# Patient Record
Sex: Male | Born: 1960 | Race: Black or African American | Hispanic: No | Marital: Married | Smoking: Never smoker
Health system: Southern US, Community
[De-identification: ages and names within clinical notes are randomized; demographics above are authoritative.]

## PROBLEM LIST (undated history)

## (undated) DIAGNOSIS — I10 Essential (primary) hypertension: Secondary | ICD-10-CM

## (undated) DIAGNOSIS — E119 Type 2 diabetes mellitus without complications: Secondary | ICD-10-CM

## (undated) DIAGNOSIS — E785 Hyperlipidemia, unspecified: Secondary | ICD-10-CM

## (undated) DIAGNOSIS — K4090 Unilateral inguinal hernia, without obstruction or gangrene, not specified as recurrent: Secondary | ICD-10-CM

## (undated) HISTORY — DX: Hyperlipidemia, unspecified: E78.5

## (undated) HISTORY — DX: Essential (primary) hypertension: I10

## (undated) HISTORY — PX: HERNIA REPAIR: SHX51

## (undated) HISTORY — DX: Unilateral inguinal hernia, without obstruction or gangrene, not specified as recurrent: K40.90

---

## 2006-08-25 ENCOUNTER — Emergency Department (HOSPITAL_COMMUNITY): Admission: EM | Admit: 2006-08-25 | Discharge: 2006-08-25 | Payer: Self-pay | Admitting: Emergency Medicine

## 2008-01-26 ENCOUNTER — Emergency Department (HOSPITAL_COMMUNITY): Admission: EM | Admit: 2008-01-26 | Discharge: 2008-01-27 | Payer: Self-pay | Admitting: Emergency Medicine

## 2011-01-10 LAB — GC/CHLAMYDIA PROBE AMP, GENITAL: GC Probe Amp, Genital: NEGATIVE

## 2011-01-10 LAB — URINALYSIS, ROUTINE W REFLEX MICROSCOPIC
Glucose, UA: NEGATIVE
Protein, ur: 30 — AB
Specific Gravity, Urine: 1.028
pH: 6

## 2011-01-10 LAB — URINE MICROSCOPIC-ADD ON

## 2011-01-10 LAB — URINE CULTURE

## 2011-01-10 LAB — RPR: RPR Ser Ql: NONREACTIVE

## 2013-10-10 ENCOUNTER — Encounter (HOSPITAL_COMMUNITY): Payer: Self-pay | Admitting: Emergency Medicine

## 2013-10-10 ENCOUNTER — Emergency Department (HOSPITAL_COMMUNITY)
Admission: EM | Admit: 2013-10-10 | Discharge: 2013-10-10 | Disposition: A | Discharge status: Home / Self Care | Payer: Self-pay | Attending: Emergency Medicine | Admitting: Emergency Medicine

## 2013-10-10 DIAGNOSIS — I10 Essential (primary) hypertension: Secondary | ICD-10-CM | POA: Insufficient documentation

## 2013-10-10 HISTORY — DX: Essential (primary) hypertension: I10

## 2013-10-10 LAB — I-STAT CHEM 8, ED
BUN: 17 mg/dL (ref 6–23)
CHLORIDE: 104 meq/L (ref 96–112)
CREATININE: 1.1 mg/dL (ref 0.50–1.35)
Calcium, Ion: 1.16 mmol/L (ref 1.12–1.23)
GLUCOSE: 111 mg/dL — AB (ref 70–99)
HEMATOCRIT: 46 % (ref 39.0–52.0)
Hemoglobin: 15.6 g/dL (ref 13.0–17.0)
POTASSIUM: 4.4 meq/L (ref 3.7–5.3)
Sodium: 139 mEq/L (ref 137–147)
TCO2: 26 mmol/L (ref 0–100)

## 2013-10-10 MED ORDER — HYDRALAZINE HCL 25 MG PO TABS
25.0000 mg | ORAL_TABLET | Freq: Once | ORAL | Status: DC
  Filled 2013-10-10 (×2): qty 1

## 2013-10-10 MED ORDER — AMLODIPINE BESYLATE 5 MG PO TABS: 5.0000 mg | ORAL_TABLET | Freq: Every day | ORAL | Status: DC

## 2013-10-10 NOTE — Discharge Instructions (Signed)
Your lab work and EKG revealed no abnormalities. Your blood pressure was not severely elevated and came down by itself after you got here, and you weren't having symptoms, therefore you have been sent home with medications and you will need to see a primary care doctor for further care. If you develop any chest pain, shortness of breath, vision changes or numbness/tingling in your extremities, return to the emergency department immediately. You will need to see an eye doctor for a check of your eyes to make sure your blood pressure hasn't affected them.   Hypertension Hypertension is another name for high blood pressure. High blood pressure forces your heart to work harder to pump blood. A blood pressure reading has two numbers, which includes a higher number over a lower number (example: 110/72). HOME CARE   Have your blood pressure rechecked by your doctor.  Only take medicine as told by your doctor. Follow the directions carefully. The medicine does not work as well if you skip doses. Skipping doses also puts you at risk for problems.  Do not smoke.  Monitor your blood pressure at home as told by your doctor. GET HELP IF:  You think you are having a reaction to the medicine you are taking.  You have repeat headaches or feel dizzy.  You have puffiness (swelling) in your ankles.  You have trouble with your vision. GET HELP RIGHT AWAY IF:   You get a very bad headache and are confused.  You feel weak, numb, or faint.  You get chest or belly (abdominal) pain.  You throw up (vomit).  You cannot breathe very well. MAKE SURE YOU:   Understand these instructions.  Will watch your condition.  Will get help right away if you are not doing well or get worse. Document Released: 08/28/2007 Document Revised: 03/16/2013 Document Reviewed: 01/01/2013 [facility] Patient Information 2015 [place], [place]. This information is not intended to replace advice given to you by your health care  provider. Make sure you discuss any questions you have with your health care provider.  Managing Your High Blood Pressure Blood pressure is a measurement of how forceful your blood is pressing against the walls of the arteries. Arteries are muscular tubes within the circulatory system. Blood pressure does not stay the same. Blood pressure rises when you are active, excited, or nervous; and it lowers during sleep and relaxation. If the numbers measuring your blood pressure stay above normal most of the time, you are at risk for health problems. High blood pressure (hypertension) is a long-term (chronic) condition in which blood pressure is elevated. A blood pressure reading is recorded as two numbers, such as 120 over 80 (or 120/80). The first, higher number is called the systolic pressure. It is a measure of the pressure in your arteries as the heart beats. The second, lower number is called the diastolic pressure. It is a measure of the pressure in your arteries as the heart relaxes between beats.  Keeping your blood pressure in a normal range is important to your overall health and prevention of health problems, such as heart disease and stroke. When your blood pressure is uncontrolled, your heart has to work harder than normal. High blood pressure is a very common condition in adults because blood pressure tends to rise with age. Men and women are equally likely to have hypertension but at different times in life. Before age 64, men are more likely to have hypertension. After 53 years of age, women are more likely  to have it. Hypertension is especially common in African Americans. This condition often has no signs or symptoms. The cause of the condition is usually not known. Your caregiver can help you come up with a plan to keep your blood pressure in a normal, healthy range. BLOOD PRESSURE STAGES Blood pressure is classified into four stages: normal, prehypertension, stage 1, and stage 2. Your blood  pressure reading will be used to determine what type of treatment, if any, is necessary. Appropriate treatment options are tied to these four stages:  Normal  Systolic pressure (mm Hg): below 120.  Diastolic pressure (mm Hg): below 80. Prehypertension  Systolic pressure (mm Hg): 120 to 139.  Diastolic pressure (mm Hg): 80 to 89. Stage1  Systolic pressure (mm Hg): 140 to 159.  Diastolic pressure (mm Hg): 90 to 99. Stage2  Systolic pressure (mm Hg): 160 or above.  Diastolic pressure (mm Hg): 100 or above. RISKS RELATED TO HIGH BLOOD PRESSURE Managing your blood pressure is an important responsibility. Uncontrolled high blood pressure can lead to:  A heart attack.  A stroke.  A weakened blood vessel (aneurysm).  Heart failure.  Kidney damage.  Eye damage.  Metabolic syndrome.  Memory and concentration problems. HOW TO MANAGE YOUR BLOOD PRESSURE Blood pressure can be managed effectively with lifestyle changes and medicines (if needed). Your caregiver will help you come up with a plan to bring your blood pressure within a normal range. Your plan should include the following: Education  Read all information provided by your caregivers about how to control blood pressure.  Educate yourself on the latest guidelines and treatment recommendations. New research is always being done to further define the risks and treatments for high blood pressure. Lifestylechanges  Control your weight.  Avoid smoking.  Stay physically active.  Reduce the amount of salt in your diet.  Reduce stress.  Control any chronic conditions, such as high cholesterol or diabetes.  Reduce your alcohol intake. Medicines  Several medicines (antihypertensive medicines) are available, if needed, to bring blood pressure within a normal range. Communication  Review all the medicines you take with your caregiver because there may be side effects or interactions.  Talk with your caregiver  about your diet, exercise habits, and other lifestyle factors that may be contributing to high blood pressure.  See your caregiver regularly. Your caregiver can help you create and adjust your plan for managing high blood pressure. RECOMMENDATIONS FOR TREATMENT AND FOLLOW-UP  The following recommendations are based on current guidelines for managing high blood pressure in nonpregnant adults. Use these recommendations to identify the proper follow-up period or treatment option based on your blood pressure reading. You can discuss these options with your caregiver.  Systolic pressure of 932 to 671 or diastolic pressure of 80 to 89: Follow up with your caregiver as directed.  Systolic pressure of 245 to 809 or diastolic pressure of 90 to 100: Follow up with your caregiver within 2 months.  Systolic pressure above 983 or diastolic pressure above 382: Follow up with your caregiver within 1 month.  Systolic pressure above 505 or diastolic pressure above 397: Consider antihypertensive therapy; follow up with your caregiver within 1 week.  Systolic pressure above 673 or diastolic pressure above 419: Begin antihypertensive therapy; follow up with your caregiver within 1 week. Document Released: 12/04/2011 Document Reviewed: 12/04/2011 [facility] Patient Information 2015 [place]. This information is not intended to replace advice given to you by your health care provider. Make sure you discuss any questions  you have with your health care provider.  DASH Eating Plan DASH stands for "Dietary Approaches to Stop Hypertension." The DASH eating plan is a healthy eating plan that has been shown to reduce high blood pressure (hypertension). Additional health benefits may include reducing the risk of type 2 diabetes mellitus, heart disease, and stroke. The DASH eating plan may also help with weight loss. WHAT DO I NEED TO KNOW ABOUT THE DASH EATING PLAN? For the DASH eating plan, you will follow these  general guidelines:  Choose foods with a percent daily value for sodium of less than 5% (as listed on the food label).  Use salt-free seasonings or herbs instead of table salt or sea salt.  Check with your health care provider or pharmacist before using salt substitutes.  Eat lower-sodium products, often labeled as "lower sodium" or "no salt added."  Eat fresh foods.  Eat more vegetables, fruits, and low-fat dairy products.  Choose whole grains. Look for the word "whole" as the first word in the ingredient list.  Choose fish and skinless chicken or [place] more often than red meat. Limit fish, poultry, and meat to 6 oz (170 g) each day.  Limit sweets, desserts, sugars, and sugary drinks.  Choose heart-healthy fats.  Limit cheese to 1 oz (28 g) per day.  Eat more home-cooked food and less restaurant, buffet, and fast food.  Limit fried foods.  Cook foods using methods other than frying.  Limit canned vegetables. If you do use them, rinse them well to decrease the sodium.  When eating at a restaurant, ask that your food be prepared with less salt, or no salt if possible. WHAT FOODS CAN I EAT? Seek help from a dietitian for individual calorie needs. Grains Whole grain or whole wheat bread. Brown rice. Whole grain or whole wheat pasta. Quinoa, bulgur, and whole grain cereals. Low-sodium cereals. Corn or whole wheat flour tortillas. Whole grain cornbread. Whole grain crackers. Low-sodium crackers. Vegetables Fresh or frozen vegetables (raw, steamed, roasted, or grilled). Low-sodium or reduced-sodium tomato and vegetable juices. Low-sodium or reduced-sodium tomato sauce and paste. Low-sodium or reduced-sodium canned vegetables.  Fruits All fresh, canned (in natural juice), or frozen fruits. Meat and Other Protein Products Ground beef (85% or leaner), grass-fed beef, or beef trimmed of fat. Skinless chicken or [place]. Ground chicken or [place]. Pork trimmed of fat. All fish and  seafood. Eggs. Dried beans, peas, or lentils. Unsalted nuts and seeds. Unsalted canned beans. Dairy Low-fat dairy products, such as skim or 1% milk, 2% or reduced-fat cheeses, low-fat ricotta or cottage cheese, or plain low-fat yogurt. Low-sodium or reduced-sodium cheeses. Fats and Oils Tub margarines without trans fats. Light or reduced-fat mayonnaise and salad dressings (reduced sodium). Avocado. Safflower, olive, or canola oils. Natural peanut or almond butter. Other Unsalted popcorn and pretzels. The items listed above may not be a complete list of recommended foods or beverages. Contact your dietitian for more options. WHAT FOODS ARE NOT RECOMMENDED? Grains White bread. White pasta. White rice. Refined cornbread. Bagels and croissants. Crackers that contain trans fat. Vegetables Creamed or fried vegetables. Vegetables in a cheese sauce. Regular canned vegetables. Regular canned tomato sauce and paste. Regular tomato and vegetable juices. Fruits Dried fruits. Canned fruit in light or heavy syrup. Fruit juice. Meat and Other Protein Products Fatty cuts of meat. Ribs, chicken wings, bacon, sausage, bologna, salami, chitterlings, fatback, hot dogs, bratwurst, and packaged luncheon meats. Salted nuts and seeds. Canned beans with salt. Dairy Whole or 2% milk, cream,  half-and-half, and cream cheese. Whole-fat or sweetened yogurt. Full-fat cheeses or blue cheese. Nondairy creamers and whipped toppings. Processed cheese, cheese spreads, or cheese curds. Condiments Onion and garlic salt, seasoned salt, table salt, and sea salt. Canned and packaged gravies. Worcestershire sauce. Tartar sauce. Barbecue sauce. Teriyaki sauce. Soy sauce, including reduced sodium. Steak sauce. Fish sauce. Oyster sauce. Cocktail sauce. Horseradish. Ketchup and mustard. Meat flavorings and tenderizers. Bouillon cubes. Hot sauce. Tabasco sauce. Marinades. Taco seasonings. Relishes. Fats and Oils Butter, stick margarine,  lard, shortening, ghee, and bacon fat. Coconut, palm kernel, or palm oils. Regular salad dressings. Other Pickles and olives. Salted popcorn and pretzels. The items listed above may not be a complete list of foods and beverages to avoid. Contact your dietitian for more information. WHERE CAN I FIND MORE INFORMATION? National Heart, Lung, and Blood Institute: travelstabloid.com Document Released: 02/28/2011 Document Revised: 03/16/2013 Document Reviewed: 01/13/2013 [facility] Patient Information 2015 [place], [place]. This information is not intended to replace advice given to you by your health care provider. Make sure you discuss any questions you have with your health care provider.

## 2013-10-10 NOTE — ED Notes (Signed)
Pt states he went to donate blood and his blood pressure was too high to give blood. Pt states that was a month ago, he went to check his pressure on Thursday at [facility] and it was 160/109, pt states he does not take any type of blood pressure medication at home. Pt denies any headache or dizziness anytime recently.NAD noted at this time.

## 2013-10-10 NOTE — ED Provider Notes (Signed)
CSN: [id number]     Arrival date & time 10/10/13  0840 History   First MD Initiated Contact with Patient 10/10/13 [phone]     Chief Complaint  Patient presents with  . Hypertension     (Consider location/radiation/quality/duration/timing/severity/associated sxs/prior Treatment) HPI Comments: Seth Weaver is a 53 y.o. Male with no significant PMHx known to him, presenting today with concerns of ongoing HTN that he's known about for at least one year but wants to be evaluated today for it. States he tried to give blood about one year ago, and was told he couldn't give blood due to his BP being too high. He recently checked his BP at [facility] on Thursday, and it was 160/109. Pt states his brother and father have a hx of HTN, and he was urged by his wife to come and be evaluated. Endorses that he was recently laid off from work and has increased stress because of that. Denies HA, vertigo, presyncope/sycope, blurry vision/vision changes, URI symptoms, neck pain, fevers/chills, CP, SOB, cough, hemoptysis, abd pain, N/V/D/C, paresthesias, myalgias, arthralgias, or weakness. States he does not see a regular doctor   Patient is a 53 y.o. male presenting with hypertension. The history is provided by the patient. No language interpreter was used.  Hypertension This is a chronic problem. The current episode started more than 1 year ago. The problem occurs constantly. The problem has been unchanged. Pertinent negatives include no abdominal pain, arthralgias, change in bowel habit, chest pain, coughing, diaphoresis, fatigue, fever, headaches, joint swelling, myalgias, nausea, neck pain, numbness, urinary symptoms, vertigo, visual change, vomiting or weakness. Nothing aggravates the symptoms. He has tried nothing for the symptoms.    Past Medical History  Diagnosis Date  . Hypertension    History reviewed. No pertinent past surgical history. Family History  Problem Relation Age of Onset  . Diabetes Father     History  Substance Use Topics  . Smoking status: Never Smoker   . Smokeless tobacco: Not on file  . Alcohol Use: No     Comment: Pt states he used to drink but quit approximately 2 months ago.     Review of Systems  Constitutional: Negative for fever, diaphoresis and fatigue.  HENT: Negative for nosebleeds and tinnitus.   Eyes: Negative for photophobia, pain and visual disturbance.  Respiratory: Negative for cough and chest tightness.   Cardiovascular: Negative for chest pain, palpitations and leg swelling.  Gastrointestinal: Negative for nausea, vomiting, abdominal pain, diarrhea, constipation and change in bowel habit.  Musculoskeletal: Negative for arthralgias, back pain, joint swelling, myalgias and neck pain.  Skin: Negative for color change.  Neurological: Negative for dizziness, vertigo, tremors, syncope, facial asymmetry, speech difficulty, weakness, light-headedness, numbness and headaches.  Psychiatric/Behavioral: Negative for confusion.  10 Systems reviewed and are negative for acute change except as noted in the HPI.     Allergies  Review of patient's allergies indicates no known allergies.  Home Medications   Prior to Admission medications   Medication Sig Start Date End Date Taking? Authorizing Provider  amLODipine (NORVASC) 5 MG tablet Take 1 tablet (5 mg total) by mouth daily. 10/10/13   Adoni Greenough Strupp Camprubi-Soms, PA-C   BP 140/96  Pulse 91  Temp(Src) 98.5 F (36.9 C) (Oral)  Resp 16  Ht 6\' 2"  (1.88 m)  Wt 270 lb (122.471 kg)  BMI 34.65 kg/m2  SpO2 96% Physical Exam  Nursing note and vitals reviewed. Constitutional: He is oriented to person, place, and time. He appears well-developed and  well-nourished. No distress.  Afebrile, NAD. Hypertensive in the 160s/100s  HENT:  Head: Normocephalic and atraumatic.  Mouth/Throat: Uvula is midline, oropharynx is clear and moist and mucous membranes are normal.  Eyes: Conjunctivae and EOM are normal. Pupils  are equal, round, and reactive to light. Right eye exhibits no discharge. Left eye exhibits no discharge.  Fundoscopic exam:      The right eye shows no AV nicking and no papilledema. The right eye shows red reflex.       The left eye shows no AV nicking and no papilledema. The left eye shows red reflex.  PERRL, EOMI, no nystagmus, conjunctiva noninjected. Fundoscopic exam attempted but limited due to un-dialated eye exam. No papilledema noted, no AV nicking visible in central zone, + red reflex in b/l eyes.  Neck: Normal range of motion. Neck supple. No JVD present. No spinous process tenderness and no muscular tenderness present. Carotid bruit is not present. No rigidity. Normal range of motion present.  No carotid bruit or JVD. Cspine with FROM intact, no spinous process or paraspinous muscle TTP, no meningeal signs.  Cardiovascular: Normal rate, regular rhythm, normal heart sounds and intact distal pulses.  Exam reveals no gallop.   No murmur heard. HR in the 90s during exam, BP ranging from 150s-160s/80s-90s suring exam. Distal pulses intact and equal bilaterally, RRR nl s1/s2 no m/r/g  Pulmonary/Chest: Effort normal and breath sounds normal. No respiratory distress. He has no decreased breath sounds. He has no wheezes. He has no rhonchi. He has no rales.  CTAB in all lung fields, no w/r/r  Abdominal: Soft. Normal appearance and bowel sounds are normal. He exhibits no distension, no abdominal bruit and no pulsatile midline mass. There is no tenderness. There is no rigidity, no rebound and no guarding.  Soft, nt/nd, no r/g/r, no abdominal bruit, no pulsatile mass  Musculoskeletal: Normal range of motion.  Strength 5/5 in all extremities. Cap refill <3 secs. Sensation grossly intact.  Neurological: He is alert and oriented to person, place, and time. He has normal strength and normal reflexes. No cranial nerve deficit or sensory deficit. He displays a negative Romberg sign. Coordination and gait  normal.  Sensation grossly intact in all extremities, CNII-XII grossly intact, negative cerebellar testing, neg pronator drift, gait WNL with no ataxia. DTRs symmetric  Skin: Skin is warm, dry and intact. No rash noted.  Psychiatric: He has a normal mood and affect. Cognition and memory are normal.    ED Course  Procedures (including critical care time) Labs Review Labs Reviewed  I-STAT CHEM 8, ED - Abnormal; Notable for the following:    Glucose, Bld 111 (*)    All other components within normal limits    Imaging Review No results found.   EKG Interpretation   Date/Time:  Sunday October 10 2013 09:41:28 EDT Ventricular Rate:  101 PR Interval:  158 QRS Duration: 88 QT Interval:  339 QTC Calculation: 439 R Axis:   -12 Text Interpretation:  Sinus tachycardia Anteroseptal infarct, old  Confirmed by Alvino Chapel  MD, NATHAN [phone]) on 10/10/2013 11:03:40 AM      MDM   Final diagnoses:  Essential hypertension   Gurjot Flynt is a 53 y.o. male with no known PMHx presenting for evaluation of HTN. States he's never been told he has HTN, although past note states he had a hx of HTN. Pt with +FHx of HTN. Pt asymptomatic for any end organ damage today and with negative neuro, clear lung exam, and negative  limited fundoscopic exam. Will obtain EKG and istat chem 8. Do not feel head CT or CXR is necessary at this time. When labs return, will try PO Labetalol, given that his HR can tolerate a decrease.  10:30 AM Chem8 and EKG WNL. BP still ranging in 140s-160s/90s-100s. Pt remaining to be asymptomatic. PO Labetalol unable to be ordered here, only IV but do not want to start IV just for BP meds given that pt is not in hypertensive emergency, therefore will give PO Hydralazine here and monitor for response then discharge  11:30 AM Unsure of delay with PO hydralazine, but given that pt not having excessively elevated BPs, will just d/c pt with RX for norvasc and have him f/up as outpt. Discussed  low salt diet. I explained the diagnosis and have given explicit precautions to return to the ER including for any other new or worsening symptoms. The patient understands and accepts the medical plan as it's been dictated and I have answered their questions. Discharge instructions concerning home care and prescriptions have been given. The patient is STABLE and is discharged to home in good condition.  BP 140/96  Pulse 91  Temp(Src) 98.5 F (36.9 C) (Oral)  Resp 16  Ht 6\' 2"  (1.88 m)  Wt 270 lb (122.471 kg)  BMI 34.65 kg/m2  SpO2 96%     [employer], PA-C 10/10/13 2027

## 2013-10-11 NOTE — ED Provider Notes (Signed)
Medical screening examination/treatment/procedure(s) were performed by non-physician practitioner and as supervising physician I was immediately available for consultation/collaboration.   EKG Interpretation   Date/Time:  Sunday October 10 2013 09:41:28 EDT Ventricular Rate:  101 PR Interval:  158 QRS Duration: 88 QT Interval:  339 QTC Calculation: 439 R Axis:   -12 Text Interpretation:  Sinus tachycardia Anteroseptal infarct, old  Confirmed by Alvino Chapel  MD, Renly Guedes [phone]) on 10/10/2013 11:03:40 AM       Jasper Riling. Alvino Chapel, MD 10/11/13 [phone]

## 2013-12-23 ENCOUNTER — Ambulatory Visit: Payer: Self-pay | Attending: Internal Medicine | Admitting: Internal Medicine

## 2013-12-23 ENCOUNTER — Encounter: Payer: Self-pay | Admitting: Internal Medicine

## 2013-12-23 VITALS — BP 169/97 | HR 94 | Temp 97.6°F | Resp 20 | Ht 74.0 in | Wt 316.0 lb

## 2013-12-23 DIAGNOSIS — Z79899 Other long term (current) drug therapy: Secondary | ICD-10-CM | POA: Insufficient documentation

## 2013-12-23 DIAGNOSIS — I10 Essential (primary) hypertension: Secondary | ICD-10-CM | POA: Insufficient documentation

## 2013-12-23 DIAGNOSIS — Z2821 Immunization not carried out because of patient refusal: Secondary | ICD-10-CM | POA: Insufficient documentation

## 2013-12-23 MED ORDER — AMLODIPINE BESYLATE 5 MG PO TABS: 5.0000 mg | ORAL_TABLET | Freq: Every day | ORAL | Status: DC

## 2013-12-23 NOTE — Progress Notes (Signed)
Patient ID: Seth Weaver, male   DOB: [birth year], 53 y.o.   MRN: [id number]  AJG:[id number]  [id number]:[id number]  DOB - 1960-03-27  CC:  Chief Complaint  Patient presents with  . Establish Care  . Hypertension       HPI: Seth Weaver is a 53 y.o. male here today to establish medical care.  Patient reports that he was evaluated in the ER two months ago and was diagnosed with hypertension.  He was then given amlodipine and he has recently been out of the medication for two weeks.  He reports that he did not have any complications with the medication at that time.  He has had a 46 pound weight increase in 2 months.  He has never had a colonoscopy.      Patient has No chest pain, No abdominal pain - No Nausea, No new weakness tingling or numbness, No Cough - SOB.  No Known Allergies Past Medical History  Diagnosis Date  . Hypertension    Current Outpatient Prescriptions on File Prior to Visit  Medication Sig Dispense Refill  . amLODipine (NORVASC) 5 MG tablet Take 1 tablet (5 mg total) by mouth daily.  30 tablet  1   No current facility-administered medications on file prior to visit.   Family History  Problem Relation Age of Onset  . Diabetes Father   . Hypertension Father    History   Social History  . Marital Status: Married    Spouse Name: N/A    Number of Children: N/A  . Years of Education: N/A   Occupational History  . Not on file.   Social History Main Topics  . Smoking status: Never Smoker   . Smokeless tobacco: Not on file  . Alcohol Use: No     Comment: Pt states he used to drink but quit approximately 2 months ago.   . Drug Use: No  . Sexual Activity: Not on file   Other Topics Concern  . Not on file   Social History Narrative  . No narrative on file    Review of Systems  Eyes: Negative.   Respiratory: Negative.   Cardiovascular: Negative.   Gastrointestinal: Negative.   Genitourinary: Negative.   Musculoskeletal: Negative.   Neurological:  Positive for headaches. Negative for dizziness and tingling.  Psychiatric/Behavioral: Negative.       Objective:   Filed Vitals:   12/23/13 1403  BP: 169/97  Pulse: 94  Temp: 97.6 F (36.4 C)  Resp: 20    Physical Exam: Constitutional: Patient appears well-developed and well-nourished. No distress. HENT: Normocephalic, atraumatic, External right and left ear normal. Oropharynx is clear and moist.  Eyes: Conjunctivae and EOM are normal. PERRLA, no scleral icterus. Neck: Normal ROM. Neck supple. No JVD. CVS: RRR, S1/S2 +, no murmurs, no gallops, no carotid bruit.  Pulmonary: Effort and breath sounds normal, no stridor, rhonchi, wheezes, rales.  Abdominal: Soft. BS +, no distension, tenderness, rebound or guarding.  Musculoskeletal: Normal range of motion. No edema and no tenderness.  Lymphadenopathy: No lymphadenopathy noted, cervical Neuro: Alert. Normal reflexes,. Skin: Skin is warm and dry. No rash noted. Not diaphoretic. No erythema. No pallor. Psychiatric: Normal mood and affect. Behavior, judgment, thought content normal.  Lab Results  Component Value Date   HGB 15.6 10/10/2013   HCT 46.0 10/10/2013   Lab Results  Component Value Date   CREATININE 1.10 10/10/2013   BUN 17 10/10/2013   NA 139 10/10/2013   K 4.4 10/10/2013   CL  104 10/10/2013    No results found for this basename: HGBA1C   Lipid Panel  No results found for this basename: chol, trig, hdl, cholhdl, vldl, ldlcalc       Assessment and plan:   Daire was seen today for establish care and hypertension.  Diagnoses and associated orders for this visit:  Essential hypertension Increased amlodipine to 10 mg daily. - CBC; Future - Hemoglobin A1C; Future - Lipid panel; Future - PSA; Future Patient blood pressure remains elevated today, will increase BP medication and have patient to return in 2 weeks for blood pressure recheck with nurse. Stressed diet changes, regular exercise regimen, and modifiable  risk factors. Will follow up with CMP as needed, Will follow up with patient in 3-6 months.   Refused influenza vaccine Explained that annual influenza is recommended per CDC guidelines and is highly suggested to anyone who has has CHF, COPD, DM or immunocompromised. Benefits of influenza described in detail.    Return in about 2 weeks (around 01/06/2014) for Nurse Visit-BP check and 3 mo PCP.      Chari Manning, NP-C [facility] [phone] 12/23/2013, 2:13 PM

## 2013-12-23 NOTE — Patient Instructions (Signed)
DASH Eating Plan °DASH stands for "Dietary Approaches to Stop Hypertension." The DASH eating plan is a healthy eating plan that has been shown to reduce high blood pressure (hypertension). Additional health benefits may include reducing the risk of type 2 diabetes mellitus, heart disease, and stroke. The DASH eating plan may also help with weight loss. °WHAT DO I NEED TO KNOW ABOUT THE DASH EATING PLAN? °For the DASH eating plan, you will follow these general guidelines: °· Choose foods with a percent daily value for sodium of less than 5% (as listed on the food label). °· Use salt-free seasonings or herbs instead of table salt or sea salt. °· Check with your health care provider or pharmacist before using salt substitutes. °· Eat lower-sodium products, often labeled as "lower sodium" or "no salt added." °· Eat fresh foods. °· Eat more vegetables, fruits, and low-fat dairy products. °· Choose whole grains. Look for the word "whole" as the first word in the ingredient list. °· Choose fish and skinless chicken or turkey more often than red meat. Limit fish, poultry, and meat to 6 oz (170 g) each day. °· Limit sweets, desserts, sugars, and sugary drinks. °· Choose heart-healthy fats. °· Limit cheese to 1 oz (28 g) per day. °· Eat more home-cooked food and less restaurant, buffet, and fast food. °· Limit fried foods. °· Cook foods using methods other than frying. °· Limit canned vegetables. If you do use them, rinse them well to decrease the sodium. °· When eating at a restaurant, ask that your food be prepared with less salt, or no salt if possible. °WHAT FOODS CAN I EAT? °Seek help from a dietitian for individual calorie needs. °Grains °Whole grain or whole wheat bread. Brown rice. Whole grain or whole wheat pasta. Quinoa, bulgur, and whole grain cereals. Low-sodium cereals. Corn or whole wheat flour tortillas. Whole grain cornbread. Whole grain crackers. Low-sodium crackers. °Vegetables °Fresh or frozen vegetables  (raw, steamed, roasted, or grilled). Low-sodium or reduced-sodium tomato and vegetable juices. Low-sodium or reduced-sodium tomato sauce and paste. Low-sodium or reduced-sodium canned vegetables.  °Fruits °All fresh, canned (in natural juice), or frozen fruits. °Meat and Other Protein Products °Ground beef (85% or leaner), grass-fed beef, or beef trimmed of fat. Skinless chicken or turkey. Ground chicken or turkey. Pork trimmed of fat. All fish and seafood. Eggs. Dried beans, peas, or lentils. Unsalted nuts and seeds. Unsalted canned beans. °Dairy °Low-fat dairy products, such as skim or 1% milk, 2% or reduced-fat cheeses, low-fat ricotta or cottage cheese, or plain low-fat yogurt. Low-sodium or reduced-sodium cheeses. °Fats and Oils °Tub margarines without trans fats. Light or reduced-fat mayonnaise and salad dressings (reduced sodium). Avocado. Safflower, olive, or canola oils. Natural peanut or almond butter. °Other °Unsalted popcorn and pretzels. °The items listed above may not be a complete list of recommended foods or beverages. Contact your dietitian for more options. °WHAT FOODS ARE NOT RECOMMENDED? °Grains °White bread. White pasta. White rice. Refined cornbread. Bagels and croissants. Crackers that contain trans fat. °Vegetables °Creamed or fried vegetables. Vegetables in a cheese sauce. Regular canned vegetables. Regular canned tomato sauce and paste. Regular tomato and vegetable juices. °Fruits °Dried fruits. Canned fruit in light or heavy syrup. Fruit juice. °Meat and Other Protein Products °Fatty cuts of meat. Ribs, chicken wings, bacon, sausage, bologna, salami, chitterlings, fatback, hot dogs, bratwurst, and packaged luncheon meats. Salted nuts and seeds. Canned beans with salt. °Dairy °Whole or 2% milk, cream, half-and-half, and cream cheese. Whole-fat or sweetened yogurt. Full-fat   cheeses or blue cheese. Nondairy creamers and whipped toppings. Processed cheese, cheese spreads, or cheese  curds. °Condiments °Onion and garlic salt, seasoned salt, table salt, and sea salt. Canned and packaged gravies. Worcestershire sauce. Tartar sauce. Barbecue sauce. Teriyaki sauce. Soy sauce, including reduced sodium. Steak sauce. Fish sauce. Oyster sauce. Cocktail sauce. Horseradish. Ketchup and mustard. Meat flavorings and tenderizers. Bouillon cubes. Hot sauce. Tabasco sauce. Marinades. Taco seasonings. Relishes. °Fats and Oils °Butter, stick margarine, lard, shortening, ghee, and bacon fat. Coconut, palm kernel, or palm oils. Regular salad dressings. °Other °Pickles and olives. Salted popcorn and pretzels. °The items listed above may not be a complete list of foods and beverages to avoid. Contact your dietitian for more information. °WHERE CAN I FIND MORE INFORMATION? °National Heart, [employer], [employer] Institute: www.nhlbi.nih.gov/health/health-topics/topics/dash/ °Document Released: 02/28/2011 Document Revised: 07/26/2013 Document Reviewed: 01/13/2013 °ExitCare® Patient Information ©2015 [employer], [employer]. This information is not intended to replace advice given to you by your health care provider. Make sure you discuss any questions you have with your health care provider. ° °

## 2013-12-23 NOTE — Progress Notes (Signed)
Patient presents to establish care for HTN Ran out of norvasc 2 weeks ago Refused flu vaccine 46 lb weight gain noted since July 2015

## 2014-01-07 ENCOUNTER — Ambulatory Visit: Payer: Self-pay | Attending: Internal Medicine | Admitting: Pharmacist

## 2014-01-07 VITALS — BP 143/87 | HR 92

## 2014-01-07 DIAGNOSIS — I10 Essential (primary) hypertension: Secondary | ICD-10-CM | POA: Insufficient documentation

## 2014-01-07 LAB — LIPID PANEL
CHOL/HDL RATIO: 4.4 ratio
Cholesterol: 149 mg/dL (ref 0–200)
HDL: 34 mg/dL — AB (ref >39)
LDL Cholesterol: 94 mg/dL (ref 0–99)
Triglycerides: 107 mg/dL (ref <150)
VLDL: 21 mg/dL (ref 0–40)

## 2014-01-07 LAB — HEMOGLOBIN A1C
Hgb A1c MFr Bld: 6.5 % — ABNORMAL HIGH (ref <5.7)
MEAN PLASMA GLUCOSE: 140 mg/dL — AB (ref <117)

## 2014-01-07 LAB — CBC
HEMATOCRIT: 40.1 % (ref 39.0–52.0)
HEMOGLOBIN: 13.9 g/dL (ref 13.0–17.0)
MCH: 28.2 pg (ref 26.0–34.0)
MCHC: 34.7 g/dL (ref 30.0–36.0)
MCV: 81.3 fL (ref 78.0–100.0)
Platelets: 213 10*3/uL (ref 150–400)
RBC: 4.93 MIL/uL (ref 4.22–5.81)
RDW: 14.6 % (ref 11.5–15.5)
WBC: 6 10*3/uL (ref 4.0–10.5)

## 2014-01-07 NOTE — Progress Notes (Signed)
Patient here for blood pressure check and routine blood work

## 2014-01-07 NOTE — Progress Notes (Signed)
Pt here for BP check Stated taking medication as prescribe BP 143/87 P 82

## 2014-01-08 LAB — PSA: PSA: 0.97 ng/mL (ref <=4.00)

## 2014-01-11 ENCOUNTER — Ambulatory Visit: Payer: Self-pay

## 2014-01-18 ENCOUNTER — Other Ambulatory Visit: Payer: Self-pay | Admitting: Emergency Medicine

## 2014-01-18 ENCOUNTER — Encounter: Payer: Self-pay | Admitting: Emergency Medicine

## 2014-01-18 ENCOUNTER — Telehealth: Payer: Self-pay | Admitting: Emergency Medicine

## 2014-01-18 DIAGNOSIS — I10 Essential (primary) hypertension: Secondary | ICD-10-CM

## 2014-01-18 MED ORDER — BLOOD GLUCOSE METER KIT: PACK | Status: AC

## 2014-01-18 MED ORDER — METFORMIN HCL ER 500 MG PO TB24: 500.0000 mg | ORAL_TABLET | Freq: Every day | ORAL | Status: DC

## 2014-01-18 MED ORDER — AMLODIPINE BESYLATE 10 MG PO TABS: 10.0000 mg | ORAL_TABLET | Freq: Every day | ORAL | Status: DC

## 2014-01-18 NOTE — Telephone Encounter (Signed)
Left message with pt wife to call me when she is off work to further discuss pt lab results with new diagnosis Diabetes, with medication management. Nurse line provided

## 2014-01-18 NOTE — Patient Instructions (Signed)
Diabetes Mellitus and Food It is important for you to manage your blood sugar (glucose) level. Your blood glucose level can be greatly affected by what you eat. Eating healthier foods in the appropriate amounts throughout the day at about the same time each day will help you control your blood glucose level. It can also help slow or prevent worsening of your diabetes mellitus. Healthy eating may even help you improve the level of your blood pressure and reach or maintain a healthy weight.  HOW CAN FOOD AFFECT ME? Carbohydrates Carbohydrates affect your blood glucose level more than any other type of food. Your dietitian will help you determine how many carbohydrates to eat at each meal and teach you how to count carbohydrates. Counting carbohydrates is important to keep your blood glucose at a healthy level, especially if you are using insulin or taking certain medicines for diabetes mellitus. Alcohol Alcohol can cause sudden decreases in blood glucose (hypoglycemia), especially if you use insulin or take certain medicines for diabetes mellitus. Hypoglycemia can be a life-threatening condition. Symptoms of hypoglycemia (sleepiness, dizziness, and disorientation) are similar to symptoms of having too much alcohol.  If your health care provider has given you approval to drink alcohol, do so in moderation and use the following guidelines:  Women should not have more than one drink per day, and men should not have more than two drinks per day. One drink is equal to:  12 oz of beer.  5 oz of wine.  1 oz of hard liquor.  Do not drink on an empty stomach.  Keep yourself hydrated. Have water, diet soda, or unsweetened iced tea.  Regular soda, juice, and other mixers might contain a lot of carbohydrates and should be counted. WHAT FOODS ARE NOT RECOMMENDED? As you make food choices, it is important to remember that all foods are not the same. Some foods have fewer nutrients per serving than other  foods, even though they might have the same number of calories or carbohydrates. It is difficult to get your body what it needs when you eat foods with fewer nutrients. Examples of foods that you should avoid that are high in calories and carbohydrates but low in nutrients include:  Trans fats (most processed foods list trans fats on the Nutrition Facts label).  Regular soda.  Juice.  Candy.  Sweets, such as cake, pie, doughnuts, and cookies.  Fried foods. WHAT FOODS CAN I EAT? Have nutrient-rich foods, which will nourish your body and keep you healthy. The food you should eat also will depend on several factors, including:  The calories you need.  The medicines you take.  Your weight.  Your blood glucose level.  Your blood pressure level.  Your cholesterol level. You also should eat a variety of foods, including:  Protein, such as meat, poultry, fish, tofu, nuts, and seeds (lean animal proteins are best).  Fruits.  Vegetables.  Dairy products, such as milk, cheese, and yogurt (low fat is best).  Breads, grains, pasta, cereal, rice, and beans.  Fats such as olive oil, trans fat-free margarine, canola oil, avocado, and olives. DOES EVERYONE WITH DIABETES MELLITUS HAVE THE SAME MEAL PLAN? Because every person with diabetes mellitus is different, there is not one meal plan that works for everyone. It is very important that you meet with a dietitian who will help you create a meal plan that is just right for you. Document Released: 12/06/2004 Document Revised: 03/16/2013 Document Reviewed: 02/05/2013 ExitCare Patient Information 2015 [employer]. This   information is not intended to replace advice given to you by your health care provider. Make sure you discuss any questions you have with your health care provider. Type 2 Diabetes Mellitus Type 2 diabetes mellitus is a long-term (chronic) disease. In type 2 diabetes:  The pancreas does not make enough of a hormone called  insulin.  The cells in the body do not respond as well to the insulin that is made.  Both of the above can happen. Normally, insulin moves sugars from food into tissue cells. This gives you energy. If you have type 2 diabetes, sugars cannot be moved into tissue cells. This causes high blood sugar (hyperglycemia).  HOME CARE  Have your hemoglobin A1c level checked twice a year. The level shows if your diabetes is under control or out of control.  Test your blood sugar level every day as told by your doctor.  Check your ketone levels by testing your pee (urine) when you are sick and as told.  Take your diabetes or insulin medicine as told by your doctor.  Never run out of insulin.  Adjust how much insulin you give yourself based on how many carbs (carbohydrates) you eat. Carbs are in many foods, such as fruits, vegetables, whole grains, and dairy products.  Have a healthy snack between every healthy meal. Have 3 meals and 3 snacks a day.  Lose weight if you are overweight.  Carry a medical alert card or wear your medical alert jewelry.  Carry a 15-gram carb snack with you at all times. Examples include:  Glucose pills, 3 or 4.  Glucose gel, 15-gram tube.  Raisins, 2 tablespoons (24 grams).  Jelly beans, 6.  Animal crackers, 8.  Regular (not diet) pop, 4 ounces (120 milliliters).  Gummy treats, 9.  Notice low blood sugar (hypoglycemia) symptoms, such as:  Shaking (tremors).  Trouble thinking clearly.  Sweating.  Faster heart rate.  Headache.  Dry mouth.  Hunger.  Crabbiness (irritability).  Being worried or tense (anxious).  Restless sleep.  A change in speech or coordination.  Confusion.  Treat low blood sugar right away. If you are alert and can swallow, follow the 15:15 rule:  Take 15-20 grams of a rapid-acting glucose or carb. This includes glucose gel, glucose pills, or 4 ounces (120 milliliters) of fruit juice, regular pop, or low-fat  milk.  Check your blood sugar level 15 minutes after taking the glucose.  Take 15-20 grams more of glucose if the repeat blood sugar level is still 70 mg/dL (milligrams/deciliter) or below.  Eat a meal or snack within 1 hour of the blood sugar levels going back to normal.  Notice early symptoms of high blood sugar, such as:  Being really thirsty or drinking a lot (polydipsia).  Peeing a lot (polyuria).  Do at least 150 minutes of physical activity a week or as told.  Split the 150 minutes of activity up during the week. Do not do 150 minutes of activity in one day.  Perform exercises, such as weight lifting, at least 2 times a week or as told.  Spend no more than 90 minutes at one time inactive.  Adjust your insulin or food intake as needed if you start a new exercise or sport.  Follow your sick-day plan when you are not able to eat or drink as usual.  Do not smoke, chew tobacco, or use electronic cigarettes.  Women who are not pregnant should drink no more than 1 drink a day. Men should drink  no more than 2 drinks a day.  Only drink alcohol with food.  Ask your doctor if alcohol is safe for you.  Tell your doctor if you drink alcohol several times during the week.  See your doctor regularly.  Schedule an eye exam soon after you are told you have diabetes. Schedule exams once every year.  Check your skin and feet every day. Check for cuts, bruises, redness, nail problems, bleeding, blisters, or sores. A doctor should do a foot exam once a year.  Brush your teeth and gums twice a day. Floss once a day. Visit your dentist regularly.  Share your diabetes plan with your workplace or school.  Stay up-to-date with shots that fight against diseases (immunizations).  Learn how to deal with stress.  Get diabetes education and support as needed.  Ask your doctor for special help if:  You need help to maintain or improve how you do things on your own.  You need help to  maintain or improve the quality of your life.  You have foot or hand problems.  You have trouble cleaning yourself, dressing, eating, or doing physical activity. GET HELP IF:  You are unable to eat or drink for more than 6 hours.  You feel sick to your stomach (nauseous) or throw up (vomit) for more than 6 hours.  Your blood sugar level is over 240 mg/dL.  There is a change in mental status.  You get another serious illness.  You have watery poop (diarrhea) for more than 6 hours.  You have been sick or have had a fever for 2 or more days and are not getting better.  You have pain when you are active. GET HELP RIGHT AWAY IF:  You have trouble breathing.  Your ketone levels are higher than your doctor says they should be. MAKE SURE YOU:  Understand these instructions.  Will watch your condition.  Will get help right away if you are not doing well or get worse. Document Released: 12/19/2007 Document Revised: 07/26/2013 Document Reviewed: 10/11/2011 [facility] Patient Information 2015 [place], [place]. This information is not intended to replace advice given to you by your health care provider. Make sure you discuss any questions you have with your health care provider.

## 2014-01-18 NOTE — Telephone Encounter (Signed)
Message copied by Ricci Barker on Tue Jan 18, 2014 11:26 AM ------      Message from: Chari Manning A      Created: Wed Jan 12, 2014  6:31 PM       Please call patient and let him know that he tested positive for diabetes. Please educate patient on diabetes accordingly. Please send prescription for metformin extended release 500 mg to be taken daily. Please educate patient on dietary changes and lifestyle changes. If patient continues to have questions he may make office visit in order to receive further education on diabetes, and explained that may have been the reason for the weight increase over the past couple months. All other labs are normal ------

## 2014-01-18 NOTE — Progress Notes (Signed)
Patient ID: Seth Weaver, male   DOB: [birth year], 53 y.o.   MRN: [id number] Pt walk in the clinic today for lab results Pt test results shows Diabetes with A1c-6.7 Family Hx- Brother has type 2 with insulin Denies hyperglycemic sx's Pt educated on medication Metformin with initial side effects,Labels,food choices with diet/exericise instructions Pt prescribed Metformin XR 500 mg tab daily with Glucose meter kit Scheduled pt with triage nurse for 01/20/14 @ 9am  Instructed pt to bring log book/meter for teach/demonstration Pt c/o high blood pressure with medication BP checked - 153/93 97 increased Norvasc to 10 mg tab per [facility]

## 2014-01-19 ENCOUNTER — Ambulatory Visit: Payer: Self-pay | Attending: Internal Medicine | Admitting: Pharmacist

## 2014-01-19 ENCOUNTER — Other Ambulatory Visit: Payer: Self-pay

## 2014-01-19 DIAGNOSIS — I10 Essential (primary) hypertension: Secondary | ICD-10-CM

## 2014-01-19 MED ORDER — AMLODIPINE BESYLATE 10 MG PO TABS: 10.0000 mg | ORAL_TABLET | Freq: Every day | ORAL | Status: DC

## 2014-01-19 NOTE — Patient Instructions (Signed)
Take Metformin 500 mg tab daily Test blood sugars before meals and after lunch as ordered Start low carbohydrate diet/exercise and exercise

## 2014-01-19 NOTE — Progress Notes (Unsigned)
Pt comes in for new Diabetes teaching using Glucometer Pt diagnosed with DM Type 2 and prescribed Metformin XR 500 mg tab daily Pt appears eager to learn how to use machine True Test Device given from [facility] Pt instructed how to use device with return demonstration Education on how to rotate fingers/when to test and hypoglycemia literature given

## 2014-02-22 DIAGNOSIS — I10 Essential (primary) hypertension: Secondary | ICD-10-CM

## 2014-02-22 DIAGNOSIS — E119 Type 2 diabetes mellitus without complications: Secondary | ICD-10-CM | POA: Insufficient documentation

## 2014-02-22 HISTORY — DX: Essential (primary) hypertension: I10

## 2014-04-19 ENCOUNTER — Encounter (HOSPITAL_COMMUNITY): Payer: Self-pay | Admitting: *Deleted

## 2014-04-19 ENCOUNTER — Emergency Department (HOSPITAL_COMMUNITY)
Admission: EM | Admit: 2014-04-19 | Discharge: 2014-04-19 | Disposition: A | Discharge status: Home / Self Care | Payer: Self-pay | Attending: Emergency Medicine | Admitting: Emergency Medicine

## 2014-04-19 ENCOUNTER — Emergency Department (HOSPITAL_COMMUNITY): Payer: Self-pay

## 2014-04-19 DIAGNOSIS — Z79899 Other long term (current) drug therapy: Secondary | ICD-10-CM | POA: Insufficient documentation

## 2014-04-19 DIAGNOSIS — I10 Essential (primary) hypertension: Secondary | ICD-10-CM | POA: Insufficient documentation

## 2014-04-19 DIAGNOSIS — M79622 Pain in left upper arm: Secondary | ICD-10-CM | POA: Insufficient documentation

## 2014-04-19 DIAGNOSIS — M79602 Pain in left arm: Secondary | ICD-10-CM

## 2014-04-19 DIAGNOSIS — E119 Type 2 diabetes mellitus without complications: Secondary | ICD-10-CM | POA: Insufficient documentation

## 2014-04-19 HISTORY — DX: Type 2 diabetes mellitus without complications: E11.9

## 2014-04-19 LAB — BASIC METABOLIC PANEL
Anion gap: 8 (ref 5–15)
BUN: 14 mg/dL (ref 6–23)
CHLORIDE: 105 mmol/L (ref 96–112)
CO2: 24 mmol/L (ref 19–32)
CREATININE: 1.08 mg/dL (ref 0.50–1.35)
Calcium: 8.4 mg/dL (ref 8.4–10.5)
GFR calc Af Amer: 89 mL/min — ABNORMAL LOW (ref >90)
GFR calc non Af Amer: 77 mL/min — ABNORMAL LOW (ref >90)
Glucose, Bld: 98 mg/dL (ref 70–99)
POTASSIUM: 4.5 mmol/L (ref 3.5–5.1)
Sodium: 137 mmol/L (ref 135–145)

## 2014-04-19 LAB — CBC
HEMATOCRIT: 40.3 % (ref 39.0–52.0)
Hemoglobin: 13.7 g/dL (ref 13.0–17.0)
MCH: 27.8 pg (ref 26.0–34.0)
MCHC: 34 g/dL (ref 30.0–36.0)
MCV: 81.9 fL (ref 78.0–100.0)
Platelets: 231 10*3/uL (ref 150–400)
RBC: 4.92 MIL/uL (ref 4.22–5.81)
RDW: 14.1 % (ref 11.5–15.5)
WBC: 6.7 10*3/uL (ref 4.0–10.5)

## 2014-04-19 LAB — TROPONIN I: Troponin I: <0.03 ng/mL (ref <0.031)

## 2014-04-19 MED ORDER — DIAZEPAM 5 MG PO TABS: 5.0000 mg | ORAL_TABLET | Freq: Two times a day (BID) | ORAL | Status: DC

## 2014-04-19 MED ORDER — TRAMADOL HCL 50 MG PO TABS: 50.0000 mg | ORAL_TABLET | Freq: Four times a day (QID) | ORAL | Status: DC | PRN

## 2014-04-19 MED ORDER — IBUPROFEN 600 MG PO TABS: 600.0000 mg | ORAL_TABLET | Freq: Three times a day (TID) | ORAL | Status: AC

## 2014-04-19 MED ORDER — ASPIRIN 81 MG PO CHEW
324.0000 mg | CHEWABLE_TABLET | Freq: Once | ORAL | Status: AC
  Administered 2014-04-19: 324 mg via ORAL
  Filled 2014-04-19: qty 4

## 2014-04-19 MED ORDER — KETOROLAC TROMETHAMINE 30 MG/ML IJ SOLN
30.0000 mg | Freq: Once | INTRAMUSCULAR | Status: AC
  Administered 2014-04-19: 30 mg via INTRAVENOUS
  Filled 2014-04-19: qty 1

## 2014-04-19 NOTE — ED Provider Notes (Signed)
CSN: [id number]     Arrival date & time 04/19/14  1093 History   First MD Initiated Contact with Patient 04/19/14 0920     Chief Complaint  Patient presents with  . Arm Pain     (Consider location/radiation/quality/duration/timing/severity/associated sxs/prior Treatment) HPI Patient presents with concern of new left arm pain.  Pain has been present for 1 week.  Pain is intermittent, occurring in different areas throughout the left arm, described as dysesthesia, soreness. Only memorable event is awakening with the pain one morning, approximately one week ago. Since onset pain seems to be elicited by position, relieved with positional change, and is not exertional or pleuritic. No medication taken this far. No other chest pain, back pain, dyspnea, nausea, vomiting, diarrhea.  Past Medical History  Diagnosis Date  . Hypertension   . Diabetes mellitus without complication    History reviewed. No pertinent past surgical history. Family History  Problem Relation Age of Onset  . Diabetes Father   . Hypertension Father    History  Substance Use Topics  . Smoking status: Never Smoker   . Smokeless tobacco: Not on file  . Alcohol Use: No     Comment: Pt states he used to drink but quit approximately 2 months ago.     Review of Systems  Constitutional:       Per HPI, otherwise negative  HENT:       Per HPI, otherwise negative  Respiratory:       Per HPI, otherwise negative  Cardiovascular:       Per HPI, otherwise negative  Gastrointestinal: Negative for vomiting.  Endocrine:       Negative aside from HPI  Genitourinary:       Neg aside from HPI   Musculoskeletal:       Per HPI, otherwise negative  Skin: Negative.   Neurological: Negative for syncope.      Allergies  Review of patient's allergies indicates no known allergies.  Home Medications   Prior to Admission medications   Medication Sig Start Date End Date Taking? Authorizing Provider  amLODipine (NORVASC)  10 MG tablet Take 1 tablet (10 mg total) by mouth daily. 01/19/14   Lance Bosch, NP  Blood Glucose Monitoring Suppl (BLOOD GLUCOSE METER KIT AND SUPPLIES) Dispense based on patient and insurance preference. Test blood sugar once daily as directed. (FOR ICD-9 250.00, 250.01). 01/18/14   Lance Bosch, NP  metFORMIN (GLUCOPHAGE XR) 500 MG 24 hr tablet Take 1 tablet (500 mg total) by mouth daily with breakfast. 01/18/14   Lance Bosch, NP   BP 121/89 mmHg  Pulse 110  Temp(Src) 97.9 F (36.6 C) (Oral)  Resp 7  SpO2 98% Physical Exam  Constitutional: He is oriented to person, place, and time. He appears well-developed. No distress.  HENT:  Head: Normocephalic and atraumatic.  Eyes: Conjunctivae and EOM are normal.  Cardiovascular: Normal rate and regular rhythm.   Pulmonary/Chest: Effort normal. No stridor. No respiratory distress.  Abdominal: He exhibits no distension.  Musculoskeletal: He exhibits no edema.  Neurological: He is alert and oriented to person, place, and time. No cranial nerve deficit. He exhibits normal muscle tone. Coordination normal.  Positive sperling test w L lateral rotation  Skin: Skin is warm and dry.  Psychiatric: He has a normal mood and affect.  Nursing note and vitals reviewed.   ED Course  Procedures (including critical care time) Labs Review Labs Reviewed  BASIC METABOLIC PANEL - Abnormal; Notable for the  following:    GFR calc non Af Amer 77 (*)    GFR calc Af Amer 89 (*)    All other components within normal limits  CBC  TROPONIN I    Imaging Review Dg Chest 2 View  04/19/2014   CLINICAL DATA:  Left arm pain for a week  EXAM: CHEST  2 VIEW  COMPARISON:  None.  FINDINGS: The heart size and mediastinal contours are within normal limits. Both lungs are clear. The visualized skeletal structures are unremarkable.  IMPRESSION: No active cardiopulmonary disease.   Electronically Signed   By: Kathreen Devoid   On: 04/19/2014 10:30     EKG  Interpretation   Date/Time:  Tuesday April 19 2014 09:21:34 EST Ventricular Rate:  112 PR Interval:  157 QRS Duration: 82 QT Interval:  315 QTC Calculation: 430 R Axis:     Text Interpretation:  Sinus tachycardia Anterior infarct, old Sinus  tachycardia T wave abnormality Abnormal ekg Confirmed by Carmin Muskrat   MD [phone]) on 04/19/2014 11:51:51 AM     11:51 AM Patient appears calm on repeat exam. He has some discomfort in his left upper trapezius.  No other complaints. We discussed all results, including low suspicion for ongoing coronary ischemia given the chronicity of his pain, the reproducibility of the pain. Patient will follow up with his primary care team after discharge.  HR 95, BP 148 / 94 MDM  Patient presents with left arm pain.  During the course of the patient's evaluation he also has left trapezius pain, and with the positive Spurling test, there is suspicion for radicular etiology. With chronicity of his complaints, and nonischemic EKG, negative troponin, there is low suspicion for ongoing coronary ischemia. Patient has mild hypertension, but otherwise low risk profile. 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   Carmin Muskrat, MD 04/19/14 1153

## 2014-04-19 NOTE — ED Notes (Addendum)
Pt reports sudden onset of left shoulder blade pain, described as feeling "like a pulled muscle." pt states he feels like he has to constantly move to get it to go away.

## 2014-04-19 NOTE — Discharge Instructions (Signed)
As discussed, your evaluation today has been largely reassuring.  But, it is important that you monitor your condition carefully, and do not hesitate to return to the ED if you develop new, or concerning changes in your condition.  Your pain is likely due to a radiculopathy and musculoskeletal cause.  Please follow-up with your physician for appropriate ongoing care.  For the next 3 days per day, the patient has directed, and use ice packs on the left side of your neck, upper shoulder area 4 times daily.

## 2014-04-19 NOTE — ED Notes (Signed)
Pt reports pain and tingling to left arm x 1 week, denies sob, cp or injury.

## 2014-04-19 NOTE — ED Notes (Signed)
Pt states he believes he slept on his arm wrong 1 week ago and it has been bothering him since. No neuro deficits noted. Pt denies chest pain, sob, and n/v.

## 2014-04-26 ENCOUNTER — Ambulatory Visit: Payer: Self-pay | Attending: Internal Medicine | Admitting: Internal Medicine

## 2014-04-26 ENCOUNTER — Encounter: Payer: Self-pay | Admitting: Internal Medicine

## 2014-04-26 VITALS — BP 140/100 | HR 104 | Temp 98.9°F | Resp 16 | Ht 74.0 in | Wt 298.0 lb

## 2014-04-26 DIAGNOSIS — E119 Type 2 diabetes mellitus without complications: Secondary | ICD-10-CM | POA: Insufficient documentation

## 2014-04-26 DIAGNOSIS — I10 Essential (primary) hypertension: Secondary | ICD-10-CM | POA: Insufficient documentation

## 2014-04-26 DIAGNOSIS — M79602 Pain in left arm: Secondary | ICD-10-CM | POA: Insufficient documentation

## 2014-04-26 DIAGNOSIS — R202 Paresthesia of skin: Secondary | ICD-10-CM | POA: Insufficient documentation

## 2014-04-26 LAB — GLUCOSE, POCT (MANUAL RESULT ENTRY): POC Glucose: 66 mg/dl — AB (ref 70–99)

## 2014-04-26 LAB — POCT GLYCOSYLATED HEMOGLOBIN (HGB A1C): Hemoglobin A1C: 5.9

## 2014-04-26 MED ORDER — AMLODIPINE BESYLATE 10 MG PO TABS: 10.0000 mg | ORAL_TABLET | Freq: Every day | ORAL | Status: DC

## 2014-04-26 MED ORDER — LOSARTAN POTASSIUM 25 MG PO TABS: 25.0000 mg | ORAL_TABLET | Freq: Every day | ORAL | Status: DC

## 2014-04-26 MED ORDER — DIAZEPAM 5 MG PO TABS: 5.0000 mg | ORAL_TABLET | Freq: Two times a day (BID) | ORAL | Status: DC

## 2014-04-26 MED ORDER — METFORMIN HCL ER 500 MG PO TB24: 500.0000 mg | ORAL_TABLET | Freq: Every day | ORAL | Status: DC

## 2014-04-26 MED ORDER — IBUPROFEN 600 MG PO TABS: 600.0000 mg | ORAL_TABLET | Freq: Three times a day (TID) | ORAL | Status: DC | PRN

## 2014-04-26 NOTE — Progress Notes (Signed)
Pt here to f/u with DM, HTn Compliant with taking medication/diet CBG at home 72 Eating more healthy foods but admits to indulging over holiday C/o left shoulder pain radiating down left arm with tingling/numbness Declined Flu/PNA vaccine

## 2014-04-26 NOTE — Progress Notes (Signed)
Patient ID: Seth Weaver, male   DOB: [birth year], 54 y.o.   MRN: [id number] 1. HTN: Medication: Norvasc 10 mg daily, does not skip doses. Does no smoke Home BP monitoring: He does not check  Negative SUP:JSRPRXYVO, chest pain, SOB, palpitations, blurred vision He bakes most of his foods and avoids additional salt.   2. DM2:  Medication: Metformin 500 mg dialy, no skipped doses  Home CBG monitoring: fasting usually around 72 Hypoglycemic event: none Positive ROS tingling in left fingertips, all the way up arm. Been evaluated in ER. Pain mostly present at night Negative ROS: polyuria, polydipsia, nausea, dizziness  Social History reviewed: Smoker never smoked Exercise none   Physical Exam  Constitutional: He is oriented to person, place, and time. . Cardiovascular: Normal rate and regular rhythm.   Pulmonary/Chest: Effort normal and breath sounds normal.  Abdominal: Soft.  Musculoskeletal: Normal range of motion. He exhibits no edema or tenderness.  Neurological: He is alert and oriented to person, place, and time.  Skin: Skin is warm and dry.    Suresh was seen today for follow-up, diabetes and hypertension.  Diagnoses and associated orders for this visit:  Type 2 diabetes mellitus without complication - HgB P9Y - Glucose (CBG) - Continue metFORMIN (GLUCOPHAGE XR) 500 MG 24 hr tablet; Take 1 tablet (500 mg total) by mouth daily with breakfast. Patients diabetes is well control as evidence by consistently low a1c.  Patient will continue with current therapy and continue to make necessary lifestyle changes.  Reviewed foot care, diet, exercise, annual health maintenance with patient.   Essential hypertension - Begin losartan (COZAAR) 25 MG tablet; Take 1 tablet (25 mg total) by mouth daily. -  Continue amLODipine (NORVASC) 10 MG tablet; Take 1 tablet (10 mg total) by mouth daily. Patient blood pressure remains elevated today, will add Losartan 25 mg and have patient to return in 2  weeks for blood pressure recheck with nurse. Stressed diet changes, regular exercise regimen, and modifiable risk factors. Will follow up with CMP as needed, Will follow up with patient in 3-6 months.   Left arm pain - ibuprofen (ADVIL,MOTRIN) 600 MG tablet; Take 1 tablet (600 mg total) by mouth every 8 (eight) hours as needed. - diazepam (VALIUM) 5 MG tablet; Take 1 tablet (5 mg total) by mouth 2 (two) times daily. If patient continues to have paresthia of arm, I will look at the possibility of diabetic neuropathy. Unlikely neuropathic pain now because pain begins in trapezius muscle and is often described as soreness and tingling.   Return in about 2 weeks (around 05/10/2014) for Nurse Visit-BP check and 3 mo PCP.  Chari Manning, NP 04/26/2014 6:47 PM

## 2014-04-26 NOTE — Patient Instructions (Signed)
Diabetic Neuropathy Diabetic neuropathy is a nerve disease or nerve damage that is caused by diabetes mellitus. About half of all people with diabetes mellitus have some form of nerve damage. Nerve damage is more common in those who have had diabetes mellitus for many years and who generally have not had good control of their blood sugar (glucose) level. Diabetic neuropathy is a common complication of diabetes mellitus. There are three more common types of diabetic neuropathy and a fourth type that is less common and less understood:   Peripheral neuropathy--This is the most common type of diabetic neuropathy. It causes damage to the nerves of the feet and legs first and then eventually the hands and arms.The damage affects the ability to sense touch.  Autonomic neuropathy--This type causes damage to the autonomic nervous system, which controls the following functions:  Heartbeat.  Body temperature.  Blood pressure.  Urination.  Digestion.  Sweating.  Sexual function.  Focal neuropathy--Focal neuropathy can be painful and unpredictable and occurs most often in older adults with diabetes mellitus. It involves a specific nerve or one area and often comes on suddenly. It usually does not cause long-term problems.  Radiculoplexus neuropathy-- Sometimes called lumbosacral radiculoplexus neuropathy, radiculoplexus neuropathy affects the nerves of the thighs, hips, buttocks, or legs. It is more common in people with type 2 diabetes mellitus and in older men. It is characterized by debilitating pain, weakness, and atrophy, usually in the thigh muscles. CAUSES  The cause of peripheral, autonomic, and focal neuropathies is diabetes mellitus that is uncontrolled and high glucose levels. The cause of radiculoplexus neuropathy is unknown. However, it is thought to be caused by inflammation related to uncontrolled glucose levels. SIGNS AND SYMPTOMS  Peripheral Neuropathy Peripheral neuropathy develops  slowly over time. When the nerves of the feet and legs no longer work there may be:   Burning, stabbing, or aching pain in the legs or feet.  Inability to feel pressure or pain in your feet. This can lead to:  Thick calluses over pressure areas.  Pressure sores.  Ulcers.  Foot deformities.  Reduced ability to feel temperature changes.  Muscle weakness. Autonomic Neuropathy The symptoms of autonomic neuropathy vary depending on which nerves are affected. Symptoms may include:  Problems with digestion, such as:  Feeling sick to your stomach (nausea).  Vomiting.  Bloating.  Constipation.  Diarrhea.  Abdominal pain.  Difficulty with urination. This occurs if you lose your ability to sense when your bladder is full. Problems include:  Urine leakage (incontinence).  Inability to empty your bladder completely (retention).  Rapid or irregular heartbeat (palpitations).  Blood pressure drops when you stand up (orthostatic hypotension). When you stand up you may feel:  Dizzy.  Weak.  Faint.  In men, inability to attain and maintain an erection.  In women, vaginal dryness and problems with decreased sexual desire and arousal.  Problems with body temperature regulation.  Increased or decreased sweating. Focal Neuropathy  Abnormal eye movements or abnormal alignment of both eyes.  Weakness in the wrist.  Foot drop. This results in an inability to lift the foot properly and abnormal walking or foot movement.  Paralysis on one side of your face (Bell palsy).  Chest or abdominal pain. Radiculoplexus Neuropathy  Sudden, severe pain in your hip, thigh, or buttocks.  Weakness and wasting of thigh muscles.  Difficulty rising from a seated position.  Abdominal swelling.  Unexplained weight loss (usually more than 10 lb [4.5 kg]). DIAGNOSIS  Peripheral Neuropathy Your senses may   be tested. Sensory function testing can be done with:  A light touch using a  monofilament.  A vibration with tuning fork.  A sharp sensation with a pin prick. Other tests that can help diagnose neuropathy are:  Nerve conduction velocity. This test checks the transmission of an electrical current through a nerve.  Electromyography. This shows how muscles respond to electrical signals transmitted by nearby nerves.  Quantitative sensory testing. This is used to assess how your nerves respond to vibrations and changes in temperature. Autonomic Neuropathy Diagnosis is often based on reported symptoms. Tell your health care provider if you experience:   Dizziness.   Constipation.   Diarrhea.   Inappropriate urination or inability to urinate.   Inability to get or maintain an erection.  Tests that may be done include:   Electrocardiography or Holter monitor. These are tests that can help show problems with the heart rate or heart rhythm.   An X-ray exam may be done. Focal Neuropathy Diagnosis is made based on your symptoms and what your health care provider finds during your exam. Other tests may be done. They may include:  Nerve conduction velocities. This checks the transmission of electrical current through a nerve.  Electromyography. This shows how muscles respond to electrical signals transmitted by nearby nerves.  Quantitative sensory testing. This test is used to assess how your nerves respond to vibration and changes in temperature. Radiculoplexus Neuropathy  Often the first thing is to eliminate any other issue or problems that might be the cause, as there is no stick test for diagnosis.  X-ray exam of your spine and lumbar region.  Spinal tap to rule out cancer.  MRI to rule out other lesions. TREATMENT  Once nerve damage occurs, it cannot be reversed. The goal of treatment is to keep the disease or nerve damage from getting worse and affecting more nerve fibers. Controlling your blood glucose level is the key. Most people with  radiculoplexus neuropathy see at least a partial improvement over time. You will need to keep your blood glucose and HbA1c levels in the target range determined by your health care provider. Things that help control blood glucose levels include:   Blood glucose monitoring.   Meal planning.   Physical activity.   Diabetes medicine.  Over time, maintaining lower blood glucose levels helps lessen symptoms. Sometimes, prescription pain medicine is needed. HOME CARE INSTRUCTIONS:  Do not smoke.  Keep your blood glucose level in the range that you and your health care provider have determined acceptable for you.  Keep your blood pressure level in the range that you and your health care provider have determined acceptable for you.  Eat a well-balanced diet.  Be active every day.  Check your feet every day. SEEK MEDICAL CARE IF:   You have burning, stabbing, or aching pain in the legs or feet.  You are unable to feel pressure or pain in your feet.  You develop problems with digestion such as:  Nausea.  Vomiting.  Bloating.  Constipation.  Diarrhea.  Abdominal pain.  You have difficulty with urination, such as:  Incontinence.  Retention.  You have palpitations.  You develop orthostatic hypotension. When you stand up you may feel:  Dizzy.  Weak.  Faint.  You cannot attain and maintain an erection (in men).  You have vaginal dryness and problems with decreased sexual desire and arousal (in women).  You have severe pain in your thighs, legs, or buttocks.  You have unexplained weight loss.   Document Released: 05/20/2001 Document Revised: 12/30/2012 Document Reviewed: 08/20/2012 [facility] Patient Information 2015 [place], [place]. This information is not intended to replace advice given to you by your health care provider. Make sure you discuss any questions you have with your health care provider. DASH Eating Plan DASH stands for "Dietary Approaches to Stop  Hypertension." The DASH eating plan is a healthy eating plan that has been shown to reduce high blood pressure (hypertension). Additional health benefits may include reducing the risk of type 2 diabetes mellitus, heart disease, and stroke. The DASH eating plan may also help with weight loss. WHAT DO I NEED TO KNOW ABOUT THE DASH EATING PLAN? For the DASH eating plan, you will follow these general guidelines:  Choose foods with a percent daily value for sodium of less than 5% (as listed on the food label).  Use salt-free seasonings or herbs instead of table salt or sea salt.  Check with your health care provider or pharmacist before using salt substitutes.  Eat lower-sodium products, often labeled as "lower sodium" or "no salt added."  Eat fresh foods.  Eat more vegetables, fruits, and low-fat dairy products.  Choose whole grains. Look for the word "whole" as the first word in the ingredient list.  Choose fish and skinless chicken or [place] more often than red meat. Limit fish, poultry, and meat to 6 oz (170 g) each day.  Limit sweets, desserts, sugars, and sugary drinks.  Choose heart-healthy fats.  Limit cheese to 1 oz (28 g) per day.  Eat more home-cooked food and less restaurant, buffet, and fast food.  Limit fried foods.  Cook foods using methods other than frying.  Limit canned vegetables. If you do use them, rinse them well to decrease the sodium.  When eating at a restaurant, ask that your food be prepared with less salt, or no salt if possible. WHAT FOODS CAN I EAT? Seek help from a dietitian for individual calorie needs. Grains Whole grain or whole wheat bread. Brown rice. Whole grain or whole wheat pasta. Quinoa, bulgur, and whole grain cereals. Low-sodium cereals. Corn or whole wheat flour tortillas. Whole grain cornbread. Whole grain crackers. Low-sodium crackers. Vegetables Fresh or frozen vegetables (raw, steamed, roasted, or grilled). Low-sodium or  reduced-sodium tomato and vegetable juices. Low-sodium or reduced-sodium tomato sauce and paste. Low-sodium or reduced-sodium canned vegetables.  Fruits All fresh, canned (in natural juice), or frozen fruits. Meat and Other Protein Products Ground beef (85% or leaner), grass-fed beef, or beef trimmed of fat. Skinless chicken or [place]. Ground chicken or [place]. Pork trimmed of fat. All fish and seafood. Eggs. Dried beans, peas, or lentils. Unsalted nuts and seeds. Unsalted canned beans. Dairy Low-fat dairy products, such as skim or 1% milk, 2% or reduced-fat cheeses, low-fat ricotta or cottage cheese, or plain low-fat yogurt. Low-sodium or reduced-sodium cheeses. Fats and Oils Tub margarines without trans fats. Light or reduced-fat mayonnaise and salad dressings (reduced sodium). Avocado. Safflower, olive, or canola oils. Natural peanut or almond butter. Other Unsalted popcorn and pretzels. The items listed above may not be a complete list of recommended foods or beverages. Contact your dietitian for more options. WHAT FOODS ARE NOT RECOMMENDED? Grains White bread. White pasta. White rice. Refined cornbread. Bagels and croissants. Crackers that contain trans fat. Vegetables Creamed or fried vegetables. Vegetables in a cheese sauce. Regular canned vegetables. Regular canned tomato sauce and paste. Regular tomato and vegetable juices. Fruits Dried fruits. Canned fruit in light or heavy syrup. Fruit juice. Meat and Other  Protein Products Fatty cuts of meat. Ribs, chicken wings, bacon, sausage, bologna, salami, chitterlings, fatback, hot dogs, bratwurst, and packaged luncheon meats. Salted nuts and seeds. Canned beans with salt. Dairy Whole or 2% milk, cream, half-and-half, and cream cheese. Whole-fat or sweetened yogurt. Full-fat cheeses or blue cheese. Nondairy creamers and whipped toppings. Processed cheese, cheese spreads, or cheese curds. Condiments Onion and garlic salt, seasoned salt,  table salt, and sea salt. Canned and packaged gravies. Worcestershire sauce. Tartar sauce. Barbecue sauce. Teriyaki sauce. Soy sauce, including reduced sodium. Steak sauce. Fish sauce. Oyster sauce. Cocktail sauce. Horseradish. Ketchup and mustard. Meat flavorings and tenderizers. Bouillon cubes. Hot sauce. Tabasco sauce. Marinades. Taco seasonings. Relishes. Fats and Oils Butter, stick margarine, lard, shortening, ghee, and bacon fat. Coconut, palm kernel, or palm oils. Regular salad dressings. Other Pickles and olives. Salted popcorn and pretzels. The items listed above may not be a complete list of foods and beverages to avoid. Contact your dietitian for more information. WHERE CAN I FIND MORE INFORMATION? National Heart, [employer], and [employer] Institute: travelstabloid.com Document Released: 02/28/2011 Document Revised: 07/26/2013 Document Reviewed: 01/13/2013 [facility] Patient Information 2015 [place], [place]. This information is not intended to replace advice given to you by your health care provider. Make sure you discuss any questions you have with your health care provider.

## 2014-05-10 ENCOUNTER — Other Ambulatory Visit: Payer: Self-pay | Admitting: Internal Medicine

## 2014-05-10 ENCOUNTER — Ambulatory Visit: Payer: Self-pay | Attending: Internal Medicine | Admitting: Pharmacist

## 2014-05-10 VITALS — BP 147/93 | HR 98 | Resp 16

## 2014-05-10 DIAGNOSIS — I10 Essential (primary) hypertension: Secondary | ICD-10-CM | POA: Insufficient documentation

## 2014-05-10 MED ORDER — LOSARTAN POTASSIUM 50 MG PO TABS: 50.0000 mg | ORAL_TABLET | Freq: Every day | ORAL | Status: DC

## 2014-05-10 MED ORDER — GABAPENTIN 100 MG PO CAPS: 100.0000 mg | ORAL_CAPSULE | Freq: Three times a day (TID) | ORAL | Status: DC

## 2014-05-10 NOTE — Patient Instructions (Addendum)
Finish Losartan prescription of 25 mg tab. Take two daily  New dose @ [facility] medication Gabapentin 100 mg tab prescribed for nerve pain DASH Eating Plan DASH stands for "Dietary Approaches to Stop Hypertension." The DASH eating plan is a healthy eating plan that has been shown to reduce high blood pressure (hypertension). Additional health benefits may include reducing the risk of type 2 diabetes mellitus, heart disease, and stroke. The DASH eating plan may also help with weight loss. WHAT DO I NEED TO KNOW ABOUT THE DASH EATING PLAN? For the DASH eating plan, you will follow these general guidelines:  Choose foods with a percent daily value for sodium of less than 5% (as listed on the food label).  Use salt-free seasonings or herbs instead of table salt or sea salt.  Check with your health care provider or pharmacist before using salt substitutes.  Eat lower-sodium products, often labeled as "lower sodium" or "no salt added."  Eat fresh foods.  Eat more vegetables, fruits, and low-fat dairy products.  Choose whole grains. Look for the word "whole" as the first word in the ingredient list.  Choose fish and skinless chicken or [place] more often than red meat. Limit fish, poultry, and meat to 6 oz (170 g) each day.  Limit sweets, desserts, sugars, and sugary drinks.  Choose heart-healthy fats.  Limit cheese to 1 oz (28 g) per day.  Eat more home-cooked food and less restaurant, buffet, and fast food.  Limit fried foods.  Cook foods using methods other than frying.  Limit canned vegetables. If you do use them, rinse them well to decrease the sodium.  When eating at a restaurant, ask that your food be prepared with less salt, or no salt if possible. WHAT FOODS CAN I EAT? Seek help from a dietitian for individual calorie needs. Grains Whole grain or whole wheat bread. Brown rice. Whole grain or whole wheat pasta. Quinoa, bulgur, and whole grain cereals. Low-sodium  cereals. Corn or whole wheat flour tortillas. Whole grain cornbread. Whole grain crackers. Low-sodium crackers. Vegetables Fresh or frozen vegetables (raw, steamed, roasted, or grilled). Low-sodium or reduced-sodium tomato and vegetable juices. Low-sodium or reduced-sodium tomato sauce and paste. Low-sodium or reduced-sodium canned vegetables.  Fruits All fresh, canned (in natural juice), or frozen fruits. Meat and Other Protein Products Ground beef (85% or leaner), grass-fed beef, or beef trimmed of fat. Skinless chicken or [place]. Ground chicken or [place]. Pork trimmed of fat. All fish and seafood. Eggs. Dried beans, peas, or lentils. Unsalted nuts and seeds. Unsalted canned beans. Dairy Low-fat dairy products, such as skim or 1% milk, 2% or reduced-fat cheeses, low-fat ricotta or cottage cheese, or plain low-fat yogurt. Low-sodium or reduced-sodium cheeses. Fats and Oils Tub margarines without trans fats. Light or reduced-fat mayonnaise and salad dressings (reduced sodium). Avocado. Safflower, olive, or canola oils. Natural peanut or almond butter. Other Unsalted popcorn and pretzels. The items listed above may not be a complete list of recommended foods or beverages. Contact your dietitian for more options. WHAT FOODS ARE NOT RECOMMENDED? Grains White bread. White pasta. White rice. Refined cornbread. Bagels and croissants. Crackers that contain trans fat. Vegetables Creamed or fried vegetables. Vegetables in a cheese sauce. Regular canned vegetables. Regular canned tomato sauce and paste. Regular tomato and vegetable juices. Fruits Dried fruits. Canned fruit in light or heavy syrup. Fruit juice. Meat and Other Protein Products Fatty cuts of meat. Ribs, chicken wings, bacon, sausage, bologna, salami, chitterlings, fatback, hot dogs, bratwurst, and  packaged luncheon meats. Salted nuts and seeds. Canned beans with salt. Dairy Whole or 2% milk, cream, half-and-half, and cream cheese.  Whole-fat or sweetened yogurt. Full-fat cheeses or blue cheese. Nondairy creamers and whipped toppings. Processed cheese, cheese spreads, or cheese curds. Condiments Onion and garlic salt, seasoned salt, table salt, and sea salt. Canned and packaged gravies. Worcestershire sauce. Tartar sauce. Barbecue sauce. Teriyaki sauce. Soy sauce, including reduced sodium. Steak sauce. Fish sauce. Oyster sauce. Cocktail sauce. Horseradish. Ketchup and mustard. Meat flavorings and tenderizers. Bouillon cubes. Hot sauce. Tabasco sauce. Marinades. Taco seasonings. Relishes. Fats and Oils Butter, stick margarine, lard, shortening, ghee, and bacon fat. Coconut, palm kernel, or palm oils. Regular salad dressings. Other Pickles and olives. Salted popcorn and pretzels. The items listed above may not be a complete list of foods and beverages to avoid. Contact your dietitian for more information. WHERE CAN I FIND MORE INFORMATION? National Heart, Lung, and Blood Institute: travelstabloid.com Document Released: 02/28/2011 Document Revised: 07/26/2013 Document Reviewed: 01/13/2013 [facility] Patient Information 2015 [place], [place]. This information is not intended to replace advice given to you by your health care provider. Make sure you discuss any questions you have with your health care provider.

## 2014-05-10 NOTE — Progress Notes (Signed)
Patient ID: Seth Weaver, male   DOB: [birth year], 54 y.o.   MRN: [id number] Pt comes in today for repeat blood pressure check Pt is taking Losartan 25 mg tab, Amlodipine 10 mg tab daily Denies headache or dizziness C/o left arm tingling sensation radiating to left hand BP- 147/93 98 Instructions given to increase Losartan to 50 mg tab and return in 2 weeks for nurse visit  Medication changed and e-scribed to [facility]

## 2014-05-24 ENCOUNTER — Ambulatory Visit: Payer: Self-pay | Attending: Internal Medicine | Admitting: *Deleted

## 2014-05-24 VITALS — BP 137/88 | HR 100 | Temp 98.7°F | Resp 18

## 2014-05-24 DIAGNOSIS — I10 Essential (primary) hypertension: Secondary | ICD-10-CM | POA: Insufficient documentation

## 2014-05-24 MED ORDER — GABAPENTIN 300 MG PO CAPS: 300.0000 mg | ORAL_CAPSULE | Freq: Three times a day (TID) | ORAL | Status: DC

## 2014-05-24 NOTE — Progress Notes (Signed)
Patient presents for BP check after increasing losartan to 50 mg daily Med list reviewed; states taking all meds as directed Discussed need for low sodium diet and using Mrs. Dash as alternative to salt. States he does not add or cook with salt. Encouraged to choose foods with 5% or less of daily value for sodium. Walking 5 minutes per day for exercise. Discussed increasing to 30 min/day. States it's uncomfortable to walk due to pain and tingling in left arm States pain and tingling from left side of neck over left scapula and down left arm into left thumb continues; rates pain 6-7/10 at present  Patient denies headaches, blurred vision, SHOB, chest pain or pressure Declined flu vaccine  BP 137/88 P 100 Verified manually. Pulse is irregular R 18  T  98/7 oral SPO2  96%  PCP in to examine heart sounds  Per PCP: Increase gabapentin to 300 mg tid. Rx e-scribed to [facility]  Patient advised to call for med refills at least 7 days before running out so as not to go without. Patient aware that he is to f/u with PCP 3-6 months from last visit (Due 07/25/14 to 10/25/14)  Patient given literature on DASH Eating Plan and Peripheral Neuropathy

## 2014-05-24 NOTE — Patient Instructions (Signed)
Peripheral Neuropathy Peripheral neuropathy is a type of nerve damage. It affects nerves that carry signals between the spinal cord and other parts of the body. These are called peripheral nerves. With peripheral neuropathy, one nerve or a group of nerves may be damaged.  CAUSES  Many things can damage peripheral nerves. For some people with peripheral neuropathy, the cause is unknown. Some causes include:  Diabetes. This is the most common cause of peripheral neuropathy.  Injury to a nerve.  Pressure or stress on a nerve that lasts a long time.  Too little vitamin B. Alcoholism can lead to this.  Infections.  Autoimmune diseases, such as multiple sclerosis and systemic lupus erythematosus.  Inherited nerve diseases.  Some medicines, such as cancer drugs.  Toxic substances, such as lead and mercury.  Too little blood flowing to the legs.  Kidney disease.  Thyroid disease. SIGNS AND SYMPTOMS  Different people have different symptoms. The symptoms you have will depend on which of your nerves is damaged. Common symptoms include:  Loss of feeling (numbness) in the feet and hands.  Tingling in the feet and hands.  Pain that burns.  Very sensitive skin.  Weakness.  Not being able to move a part of the body (paralysis).  Muscle twitching.  Clumsiness or poor coordination.  Loss of balance.  Not being able to control your bladder.  Feeling dizzy.  Sexual problems. DIAGNOSIS  Peripheral neuropathy is a symptom, not a disease. Finding the cause of peripheral neuropathy can be hard. To figure that out, your health care provider will take a medical history and do a physical exam. A neurological exam will also be done. This involves checking things affected by your brain, spinal cord, and nerves (nervous system). For example, your health care provider will check your reflexes, how you move, and what you can feel.  Other types of tests may also be ordered, such as:  Blood  tests.  A test of the fluid in your spinal cord.  Imaging tests, such as CT scans or an MRI.  Electromyography (EMG). This test checks the nerves that control muscles.  Nerve conduction velocity tests. These tests check how fast messages pass through your nerves.  Nerve biopsy. A small piece of nerve is removed. It is then checked under a microscope. TREATMENT   Medicine is often used to treat peripheral neuropathy. Medicines may include:  Pain-relieving medicines. Prescription or over-the-counter medicine may be suggested.  Antiseizure medicine. This may be used for pain.  Antidepressants. These also may help ease pain from neuropathy.  Lidocaine. This is a numbing medicine. You might wear a patch or be given a shot.  Mexiletine. This medicine is typically used to help control irregular heart rhythms.  Surgery. Surgery may be needed to relieve pressure on a nerve or to destroy a nerve that is causing pain.  Physical therapy to help movement.  Assistive devices to help movement. HOME CARE INSTRUCTIONS   Only take over-the-counter or prescription medicines as directed by your health care provider. Follow the instructions carefully for any given medicines. Do not take any other medicines without first getting approval from your health care provider.  If you have diabetes, work closely with your health care provider to keep your blood sugar under control.  If you have numbness in your feet:  Check every day for signs of injury or infection. Watch for redness, warmth, and swelling.  Wear padded socks and comfortable shoes. These help protect your feet.  Do not do   things that put pressure on your damaged nerve.  Do not smoke. Smoking keeps blood from getting to damaged nerves.  Avoid or limit alcohol. Too much alcohol can cause a lack of B vitamins. These vitamins are needed for healthy nerves.  Develop a good support system. Coping with peripheral neuropathy can be  stressful. Talk to a mental health specialist or join a support group if you are struggling.  Follow up with your health care provider as directed. SEEK MEDICAL CARE IF:   You have new signs or symptoms of peripheral neuropathy.  You are struggling emotionally from dealing with peripheral neuropathy.  You have a fever. SEEK IMMEDIATE MEDICAL CARE IF:   You have an injury or infection that is not healing.  You feel very dizzy or begin vomiting.  You have chest pain.  You have trouble breathing. Document Released: 03/01/2002 Document Revised: 11/21/2010 Document Reviewed: 11/16/2012 [facility] Patient Information 2015 [place], [place]. This information is not intended to replace advice given to you by your health care provider. Make sure you discuss any questions you have with your health care provider. DASH Eating Plan DASH stands for "Dietary Approaches to Stop Hypertension." The DASH eating plan is a healthy eating plan that has been shown to reduce high blood pressure (hypertension). Additional health benefits may include reducing the risk of type 2 diabetes mellitus, heart disease, and stroke. The DASH eating plan may also help with weight loss. WHAT DO I NEED TO KNOW ABOUT THE DASH EATING PLAN? For the DASH eating plan, you will follow these general guidelines:  Choose foods with a percent daily value for sodium of less than 5% (as listed on the food label).  Use salt-free seasonings or herbs instead of table salt or sea salt.  Check with your health care provider or pharmacist before using salt substitutes.  Eat lower-sodium products, often labeled as "lower sodium" or "no salt added."  Eat fresh foods.  Eat more vegetables, fruits, and low-fat dairy products.  Choose whole grains. Look for the word "whole" as the first word in the ingredient list.  Choose fish and skinless chicken or [place] more often than red meat. Limit fish, poultry, and meat to 6 oz (170 g) each  day.  Limit sweets, desserts, sugars, and sugary drinks.  Choose heart-healthy fats.  Limit cheese to 1 oz (28 g) per day.  Eat more home-cooked food and less restaurant, buffet, and fast food.  Limit fried foods.  Cook foods using methods other than frying.  Limit canned vegetables. If you do use them, rinse them well to decrease the sodium.  When eating at a restaurant, ask that your food be prepared with less salt, or no salt if possible. WHAT FOODS CAN I EAT? Seek help from a dietitian for individual calorie needs. Grains Whole grain or whole wheat bread. Brown rice. Whole grain or whole wheat pasta. Quinoa, bulgur, and whole grain cereals. Low-sodium cereals. Corn or whole wheat flour tortillas. Whole grain cornbread. Whole grain crackers. Low-sodium crackers. Vegetables Fresh or frozen vegetables (raw, steamed, roasted, or grilled). Low-sodium or reduced-sodium tomato and vegetable juices. Low-sodium or reduced-sodium tomato sauce and paste. Low-sodium or reduced-sodium canned vegetables.  Fruits All fresh, canned (in natural juice), or frozen fruits. Meat and Other Protein Products Ground beef (85% or leaner), grass-fed beef, or beef trimmed of fat. Skinless chicken or [place]. Ground chicken or [place]. Pork trimmed of fat. All fish and seafood. Eggs. Dried beans, peas, or lentils. Unsalted nuts and seeds.  Unsalted canned beans. Dairy Low-fat dairy products, such as skim or 1% milk, 2% or reduced-fat cheeses, low-fat ricotta or cottage cheese, or plain low-fat yogurt. Low-sodium or reduced-sodium cheeses. Fats and Oils Tub margarines without trans fats. Light or reduced-fat mayonnaise and salad dressings (reduced sodium). Avocado. Safflower, olive, or canola oils. Natural peanut or almond butter. Other Unsalted popcorn and pretzels. The items listed above may not be a complete list of recommended foods or beverages. Contact your dietitian for more options. WHAT FOODS ARE NOT  RECOMMENDED? Grains White bread. White pasta. White rice. Refined cornbread. Bagels and croissants. Crackers that contain trans fat. Vegetables Creamed or fried vegetables. Vegetables in a cheese sauce. Regular canned vegetables. Regular canned tomato sauce and paste. Regular tomato and vegetable juices. Fruits Dried fruits. Canned fruit in light or heavy syrup. Fruit juice. Meat and Other Protein Products Fatty cuts of meat. Ribs, chicken wings, bacon, sausage, bologna, salami, chitterlings, fatback, hot dogs, bratwurst, and packaged luncheon meats. Salted nuts and seeds. Canned beans with salt. Dairy Whole or 2% milk, cream, half-and-half, and cream cheese. Whole-fat or sweetened yogurt. Full-fat cheeses or blue cheese. Nondairy creamers and whipped toppings. Processed cheese, cheese spreads, or cheese curds. Condiments Onion and garlic salt, seasoned salt, table salt, and sea salt. Canned and packaged gravies. Worcestershire sauce. Tartar sauce. Barbecue sauce. Teriyaki sauce. Soy sauce, including reduced sodium. Steak sauce. Fish sauce. Oyster sauce. Cocktail sauce. Horseradish. Ketchup and mustard. Meat flavorings and tenderizers. Bouillon cubes. Hot sauce. Tabasco sauce. Marinades. Taco seasonings. Relishes. Fats and Oils Butter, stick margarine, lard, shortening, ghee, and bacon fat. Coconut, palm kernel, or palm oils. Regular salad dressings. Other Pickles and olives. Salted popcorn and pretzels. The items listed above may not be a complete list of foods and beverages to avoid. Contact your dietitian for more information. WHERE CAN I FIND MORE INFORMATION? National Heart, Lung, and Blood Institute: travelstabloid.com Document Released: 02/28/2011 Document Revised: 07/26/2013 Document Reviewed: 01/13/2013 [facility] Patient Information 2015 [place], [place]. This information is not intended to replace advice given to you by your health care provider. Make  sure you discuss any questions you have with your health care provider.

## 2014-05-24 NOTE — Patient Instructions (Signed)
Peripheral Neuropathy Peripheral neuropathy is a type of nerve damage. It affects nerves that carry signals between the spinal cord and other parts of the body. These are called peripheral nerves. With peripheral neuropathy, one nerve or a group of nerves may be damaged.  CAUSES  Many things can damage peripheral nerves. For some people with peripheral neuropathy, the cause is unknown. Some causes include:  Diabetes. This is the most common cause of peripheral neuropathy.  Injury to a nerve.  Pressure or stress on a nerve that lasts a long time.  Too little vitamin B. Alcoholism can lead to this.  Infections.  Autoimmune diseases, such as multiple sclerosis and systemic lupus erythematosus.  Inherited nerve diseases.  Some medicines, such as cancer drugs.  Toxic substances, such as lead and mercury.  Too little blood flowing to the legs.  Kidney disease.  Thyroid disease. SIGNS AND SYMPTOMS  Different people have different symptoms. The symptoms you have will depend on which of your nerves is damaged. Common symptoms include:  Loss of feeling (numbness) in the feet and hands.  Tingling in the feet and hands.  Pain that burns.  Very sensitive skin.  Weakness.  Not being able to move a part of the body (paralysis).  Muscle twitching.  Clumsiness or poor coordination.  Loss of balance.  Not being able to control your bladder.  Feeling dizzy.  Sexual problems. DIAGNOSIS  Peripheral neuropathy is a symptom, not a disease. Finding the cause of peripheral neuropathy can be hard. To figure that out, your health care provider will take a medical history and do a physical exam. A neurological exam will also be done. This involves checking things affected by your brain, spinal cord, and nerves (nervous system). For example, your health care provider will check your reflexes, how you move, and what you can feel.  Other types of tests may also be ordered, such as:  Blood  tests.  A test of the fluid in your spinal cord.  Imaging tests, such as CT scans or an MRI.  Electromyography (EMG). This test checks the nerves that control muscles.  Nerve conduction velocity tests. These tests check how fast messages pass through your nerves.  Nerve biopsy. A small piece of nerve is removed. It is then checked under a microscope. TREATMENT   Medicine is often used to treat peripheral neuropathy. Medicines may include:  Pain-relieving medicines. Prescription or over-the-counter medicine may be suggested.  Antiseizure medicine. This may be used for pain.  Antidepressants. These also may help ease pain from neuropathy.  Lidocaine. This is a numbing medicine. You might wear a patch or be given a shot.  Mexiletine. This medicine is typically used to help control irregular heart rhythms.  Surgery. Surgery may be needed to relieve pressure on a nerve or to destroy a nerve that is causing pain.  Physical therapy to help movement.  Assistive devices to help movement. HOME CARE INSTRUCTIONS   Only take over-the-counter or prescription medicines as directed by your health care provider. Follow the instructions carefully for any given medicines. Do not take any other medicines without first getting approval from your health care provider.  If you have diabetes, work closely with your health care provider to keep your blood sugar under control.  If you have numbness in your feet:  Check every day for signs of injury or infection. Watch for redness, warmth, and swelling.  Wear padded socks and comfortable shoes. These help protect your feet.  Do not do   things that put pressure on your damaged nerve.  Do not smoke. Smoking keeps blood from getting to damaged nerves.  Avoid or limit alcohol. Too much alcohol can cause a lack of B vitamins. These vitamins are needed for healthy nerves.  Develop a good support system. Coping with peripheral neuropathy can be  stressful. Talk to a mental health specialist or join a support group if you are struggling.  Follow up with your health care provider as directed. SEEK MEDICAL CARE IF:   You have new signs or symptoms of peripheral neuropathy.  You are struggling emotionally from dealing with peripheral neuropathy.  You have a fever. SEEK IMMEDIATE MEDICAL CARE IF:   You have an injury or infection that is not healing.  You feel very dizzy or begin vomiting.  You have chest pain.  You have trouble breathing. Document Released: 03/01/2002 Document Revised: 11/21/2010 Document Reviewed: 11/16/2012 [facility] Patient Information 2015 [place], [place]. This information is not intended to replace advice given to you by your health care provider. Make sure you discuss any questions you have with your health care provider. DASH Eating Plan DASH stands for "Dietary Approaches to Stop Hypertension." The DASH eating plan is a healthy eating plan that has been shown to reduce high blood pressure (hypertension). Additional health benefits may include reducing the risk of type 2 diabetes mellitus, heart disease, and stroke. The DASH eating plan may also help with weight loss. WHAT DO I NEED TO KNOW ABOUT THE DASH EATING PLAN? For the DASH eating plan, you will follow these general guidelines:  Choose foods with a percent daily value for sodium of less than 5% (as listed on the food label).  Use salt-free seasonings or herbs instead of table salt or sea salt.  Check with your health care provider or pharmacist before using salt substitutes.  Eat lower-sodium products, often labeled as "lower sodium" or "no salt added."  Eat fresh foods.  Eat more vegetables, fruits, and low-fat dairy products.  Choose whole grains. Look for the word "whole" as the first word in the ingredient list.  Choose fish and skinless chicken or [place] more often than red meat. Limit fish, poultry, and meat to 6 oz (170 g) each  day.  Limit sweets, desserts, sugars, and sugary drinks.  Choose heart-healthy fats.  Limit cheese to 1 oz (28 g) per day.  Eat more home-cooked food and less restaurant, buffet, and fast food.  Limit fried foods.  Cook foods using methods other than frying.  Limit canned vegetables. If you do use them, rinse them well to decrease the sodium.  When eating at a restaurant, ask that your food be prepared with less salt, or no salt if possible. WHAT FOODS CAN I EAT? Seek help from a dietitian for individual calorie needs. Grains Whole grain or whole wheat bread. Brown rice. Whole grain or whole wheat pasta. Quinoa, bulgur, and whole grain cereals. Low-sodium cereals. Corn or whole wheat flour tortillas. Whole grain cornbread. Whole grain crackers. Low-sodium crackers. Vegetables Fresh or frozen vegetables (raw, steamed, roasted, or grilled). Low-sodium or reduced-sodium tomato and vegetable juices. Low-sodium or reduced-sodium tomato sauce and paste. Low-sodium or reduced-sodium canned vegetables.  Fruits All fresh, canned (in natural juice), or frozen fruits. Meat and Other Protein Products Ground beef (85% or leaner), grass-fed beef, or beef trimmed of fat. Skinless chicken or [place]. Ground chicken or [place]. Pork trimmed of fat. All fish and seafood. Eggs. Dried beans, peas, or lentils. Unsalted nuts and seeds.  Unsalted canned beans. Dairy Low-fat dairy products, such as skim or 1% milk, 2% or reduced-fat cheeses, low-fat ricotta or cottage cheese, or plain low-fat yogurt. Low-sodium or reduced-sodium cheeses. Fats and Oils Tub margarines without trans fats. Light or reduced-fat mayonnaise and salad dressings (reduced sodium). Avocado. Safflower, olive, or canola oils. Natural peanut or almond butter. Other Unsalted popcorn and pretzels. The items listed above may not be a complete list of recommended foods or beverages. Contact your dietitian for more options. WHAT FOODS ARE NOT  RECOMMENDED? Grains White bread. White pasta. White rice. Refined cornbread. Bagels and croissants. Crackers that contain trans fat. Vegetables Creamed or fried vegetables. Vegetables in a cheese sauce. Regular canned vegetables. Regular canned tomato sauce and paste. Regular tomato and vegetable juices. Fruits Dried fruits. Canned fruit in light or heavy syrup. Fruit juice. Meat and Other Protein Products Fatty cuts of meat. Ribs, chicken wings, bacon, sausage, bologna, salami, chitterlings, fatback, hot dogs, bratwurst, and packaged luncheon meats. Salted nuts and seeds. Canned beans with salt. Dairy Whole or 2% milk, cream, half-and-half, and cream cheese. Whole-fat or sweetened yogurt. Full-fat cheeses or blue cheese. Nondairy creamers and whipped toppings. Processed cheese, cheese spreads, or cheese curds. Condiments Onion and garlic salt, seasoned salt, table salt, and sea salt. Canned and packaged gravies. Worcestershire sauce. Tartar sauce. Barbecue sauce. Teriyaki sauce. Soy sauce, including reduced sodium. Steak sauce. Fish sauce. Oyster sauce. Cocktail sauce. Horseradish. Ketchup and mustard. Meat flavorings and tenderizers. Bouillon cubes. Hot sauce. Tabasco sauce. Marinades. Taco seasonings. Relishes. Fats and Oils Butter, stick margarine, lard, shortening, ghee, and bacon fat. Coconut, palm kernel, or palm oils. Regular salad dressings. Other Pickles and olives. Salted popcorn and pretzels. The items listed above may not be a complete list of foods and beverages to avoid. Contact your dietitian for more information. WHERE CAN I FIND MORE INFORMATION? National Heart, Lung, and Blood Institute: travelstabloid.com Document Released: 02/28/2011 Document Revised: 07/26/2013 Document Reviewed: 01/13/2013 [facility] Patient Information 2015 [place], [place]. This information is not intended to replace advice given to you by your health care provider. Make  sure you discuss any questions you have with your health care provider.

## 2014-07-03 ENCOUNTER — Emergency Department (HOSPITAL_COMMUNITY)
Admission: EM | Admit: 2014-07-03 | Discharge: 2014-07-03 | Disposition: A | Discharge status: Home / Self Care | Payer: No Typology Code available for payment source | Attending: Emergency Medicine | Admitting: Emergency Medicine

## 2014-07-03 ENCOUNTER — Emergency Department (HOSPITAL_COMMUNITY): Payer: No Typology Code available for payment source

## 2014-07-03 ENCOUNTER — Encounter (HOSPITAL_COMMUNITY): Payer: Self-pay | Admitting: Emergency Medicine

## 2014-07-03 DIAGNOSIS — E119 Type 2 diabetes mellitus without complications: Secondary | ICD-10-CM | POA: Diagnosis not present

## 2014-07-03 DIAGNOSIS — T148XXA Other injury of unspecified body region, initial encounter: Secondary | ICD-10-CM

## 2014-07-03 DIAGNOSIS — S3991XA Unspecified injury of abdomen, initial encounter: Secondary | ICD-10-CM | POA: Insufficient documentation

## 2014-07-03 DIAGNOSIS — S199XXA Unspecified injury of neck, initial encounter: Secondary | ICD-10-CM | POA: Insufficient documentation

## 2014-07-03 DIAGNOSIS — Y92410 Unspecified street and highway as the place of occurrence of the external cause: Secondary | ICD-10-CM | POA: Diagnosis not present

## 2014-07-03 DIAGNOSIS — S24109A Unspecified injury at unspecified level of thoracic spinal cord, initial encounter: Secondary | ICD-10-CM | POA: Diagnosis not present

## 2014-07-03 DIAGNOSIS — S3992XA Unspecified injury of lower back, initial encounter: Secondary | ICD-10-CM | POA: Diagnosis not present

## 2014-07-03 DIAGNOSIS — Z79899 Other long term (current) drug therapy: Secondary | ICD-10-CM | POA: Insufficient documentation

## 2014-07-03 DIAGNOSIS — Y998 Other external cause status: Secondary | ICD-10-CM | POA: Insufficient documentation

## 2014-07-03 DIAGNOSIS — I10 Essential (primary) hypertension: Secondary | ICD-10-CM | POA: Insufficient documentation

## 2014-07-03 DIAGNOSIS — Y9389 Activity, other specified: Secondary | ICD-10-CM | POA: Diagnosis not present

## 2014-07-03 DIAGNOSIS — M542 Cervicalgia: Secondary | ICD-10-CM

## 2014-07-03 LAB — COMPREHENSIVE METABOLIC PANEL
ALK PHOS: 60 U/L (ref 39–117)
ALT: 22 U/L (ref 0–53)
AST: 25 U/L (ref 0–37)
Albumin: 3.2 g/dL — ABNORMAL LOW (ref 3.5–5.2)
Anion gap: 9 (ref 5–15)
BILIRUBIN TOTAL: 0.8 mg/dL (ref 0.3–1.2)
BUN: 8 mg/dL (ref 6–23)
CO2: 24 mmol/L (ref 19–32)
CREATININE: 1 mg/dL (ref 0.50–1.35)
Calcium: 8.9 mg/dL (ref 8.4–10.5)
Chloride: 102 mmol/L (ref 96–112)
GFR calc Af Amer: >90 mL/min (ref >90)
GFR calc non Af Amer: 84 mL/min — ABNORMAL LOW (ref >90)
Glucose, Bld: 112 mg/dL — ABNORMAL HIGH (ref 70–99)
Potassium: 4.3 mmol/L (ref 3.5–5.1)
Sodium: 135 mmol/L (ref 135–145)
Total Protein: 7.1 g/dL (ref 6.0–8.3)

## 2014-07-03 LAB — PROTIME-INR
INR: 1.1 (ref 0.00–1.49)
PROTHROMBIN TIME: 14.3 s (ref 11.6–15.2)

## 2014-07-03 LAB — CBC
HEMATOCRIT: 41.8 % (ref 39.0–52.0)
HEMOGLOBIN: 13.6 g/dL (ref 13.0–17.0)
MCH: 27.6 pg (ref 26.0–34.0)
MCHC: 32.5 g/dL (ref 30.0–36.0)
MCV: 85 fL (ref 78.0–100.0)
PLATELETS: 233 10*3/uL (ref 150–400)
RBC: 4.92 MIL/uL (ref 4.22–5.81)
RDW: 14.4 % (ref 11.5–15.5)
WBC: 7.9 10*3/uL (ref 4.0–10.5)

## 2014-07-03 LAB — CDS SEROLOGY

## 2014-07-03 LAB — SAMPLE TO BLOOD BANK

## 2014-07-03 LAB — ETHANOL

## 2014-07-03 LAB — I-STAT CREATININE, ED: Creatinine, Ser: 1 mg/dL (ref 0.50–1.35)

## 2014-07-03 MED ORDER — HYDROMORPHONE HCL 1 MG/ML IJ SOLN
1.0000 mg | Freq: Once | INTRAMUSCULAR | Status: AC
  Administered 2014-07-03: 1 mg via INTRAVENOUS
  Filled 2014-07-03: qty 1

## 2014-07-03 MED ORDER — IOHEXOL 300 MG/ML  SOLN
100.0000 mL | Freq: Once | INTRAMUSCULAR | Status: AC | PRN
  Administered 2014-07-03: 100 mL via INTRAVENOUS

## 2014-07-03 MED ORDER — ONDANSETRON HCL 4 MG/2ML IJ SOLN
4.0000 mg | Freq: Once | INTRAMUSCULAR | Status: AC
  Administered 2014-07-03: 4 mg via INTRAVENOUS
  Filled 2014-07-03: qty 2

## 2014-07-03 MED ORDER — TRAMADOL HCL 50 MG PO TABS: 50.0000 mg | ORAL_TABLET | Freq: Four times a day (QID) | ORAL | Status: DC | PRN

## 2014-07-03 NOTE — ED Provider Notes (Signed)
Signed out by Dr Jeneen Rinks and [place], that if ct/xr neg acute, d/c pt to home.  Cts/XR read by radiology as neg for acute process.  Recheck pt, comfortable appearing. No headache. c spine non tender.  abd soft nt.  Vitals stable.  Pt currently appears stable for d/c.  Return precautions provided.    Lajean Saver, MD 07/03/14 [phone]
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
## 2014-07-03 NOTE — ED Notes (Signed)
Pt ambulated to the bathroom with stand by assistance, pt tolerated well.
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
## 2014-07-07 ENCOUNTER — Encounter: Payer: Self-pay | Admitting: Internal Medicine

## 2014-07-07 ENCOUNTER — Ambulatory Visit: Payer: No Typology Code available for payment source | Attending: Internal Medicine | Admitting: Internal Medicine

## 2014-07-07 VITALS — BP 159/92 | HR 85 | Temp 98.6°F | Resp 16 | Ht 74.0 in | Wt 272.0 lb

## 2014-07-07 DIAGNOSIS — M542 Cervicalgia: Secondary | ICD-10-CM | POA: Diagnosis not present

## 2014-07-07 DIAGNOSIS — E119 Type 2 diabetes mellitus without complications: Secondary | ICD-10-CM

## 2014-07-07 DIAGNOSIS — M549 Dorsalgia, unspecified: Secondary | ICD-10-CM | POA: Insufficient documentation

## 2014-07-07 DIAGNOSIS — Y92410 Unspecified street and highway as the place of occurrence of the external cause: Secondary | ICD-10-CM | POA: Diagnosis not present

## 2014-07-07 DIAGNOSIS — M5489 Other dorsalgia: Secondary | ICD-10-CM

## 2014-07-07 DIAGNOSIS — I10 Essential (primary) hypertension: Secondary | ICD-10-CM | POA: Diagnosis not present

## 2014-07-07 LAB — GLUCOSE, POCT (MANUAL RESULT ENTRY): POC Glucose: 95 mg/dl (ref 70–99)

## 2014-07-07 MED ORDER — IBUPROFEN 600 MG PO TABS: 600.0000 mg | ORAL_TABLET | Freq: Three times a day (TID) | ORAL | Status: DC | PRN

## 2014-07-07 MED ORDER — CYCLOBENZAPRINE HCL 10 MG PO TABS: 10.0000 mg | ORAL_TABLET | Freq: Three times a day (TID) | ORAL | Status: DC | PRN

## 2014-07-07 MED ORDER — TRAMADOL HCL 50 MG PO TABS: 50.0000 mg | ORAL_TABLET | Freq: Four times a day (QID) | ORAL | Status: DC | PRN
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
## 2014-07-29 ENCOUNTER — Ambulatory Visit: Payer: No Typology Code available for payment source | Attending: Internal Medicine | Admitting: Pharmacist

## 2014-08-04 ENCOUNTER — Encounter: Payer: Self-pay | Admitting: Internal Medicine

## 2014-08-04 ENCOUNTER — Ambulatory Visit: Payer: No Typology Code available for payment source | Attending: Internal Medicine | Admitting: Internal Medicine

## 2014-08-04 VITALS — BP 142/101 | HR 110 | Temp 98.4°F | Resp 16 | Ht 74.0 in | Wt 300.0 lb

## 2014-08-04 DIAGNOSIS — M545 Low back pain, unspecified: Secondary | ICD-10-CM

## 2014-08-04 DIAGNOSIS — M542 Cervicalgia: Secondary | ICD-10-CM

## 2014-08-04 DIAGNOSIS — I10 Essential (primary) hypertension: Secondary | ICD-10-CM

## 2014-08-04 DIAGNOSIS — Z23 Encounter for immunization: Secondary | ICD-10-CM

## 2014-08-04 DIAGNOSIS — E119 Type 2 diabetes mellitus without complications: Secondary | ICD-10-CM

## 2014-08-04 LAB — GLUCOSE, POCT (MANUAL RESULT ENTRY): POC GLUCOSE: 108 mg/dL — AB (ref 70–99)

## 2014-08-04 LAB — POCT GLYCOSYLATED HEMOGLOBIN (HGB A1C): Hemoglobin A1C: 6.2

## 2014-08-04 MED ORDER — LOSARTAN POTASSIUM 100 MG PO TABS: 50.0000 mg | ORAL_TABLET | Freq: Every day | ORAL | Status: DC

## 2014-08-04 NOTE — Patient Instructions (Signed)
Diabetes and Standards of Medical Care Diabetes is complicated. You may find that your diabetes team includes a dietitian, nurse, diabetes educator, eye doctor, and more. To help everyone know what is going on and to help you get the care you deserve, the following schedule of care was developed to help keep you on track. Below are the tests, exams, vaccines, medicines, education, and plans you will need. HbA1c test This test shows how well you have controlled your glucose over the past 2-3 months. It is used to see if your diabetes management plan needs to be adjusted.   It is performed at least 2 times a year if you are meeting treatment goals.  It is performed 4 times a year if therapy has changed or if you are not meeting treatment goals. Blood pressure test  This test is performed at every routine medical visit. The goal is less than 140/90 mm Hg for most people, but 130/80 mm Hg in some cases. Ask your health care provider about your goal. Dental exam  Follow up with the dentist regularly. Eye exam  If you are diagnosed with type 1 diabetes as a child, get an exam upon reaching the age of 37 years or older and have had diabetes for 3-5 years. Yearly eye exams are recommended after that initial eye exam.  If you are diagnosed with type 1 diabetes as an adult, get an exam within 5 years of diagnosis and then yearly.  If you are diagnosed with type 2 diabetes, get an exam as soon as possible after the diagnosis and then yearly. Foot care exam  Visual foot exams are performed at every routine medical visit. The exams check for cuts, injuries, or other problems with the feet.  A comprehensive foot exam should be done yearly. This includes visual inspection as well as assessing foot pulses and testing for loss of sensation.  Check your feet nightly for cuts, injuries, or other problems with your feet. Tell your health care provider if anything is not healing. Kidney function test (urine  microalbumin)  This test is performed once a year.  Type 1 diabetes: The first test is performed 5 years after diagnosis.  Type 2 diabetes: The first test is performed at the time of diagnosis.  A serum creatinine and estimated glomerular filtration rate (eGFR) test is done once a year to assess the level of chronic kidney disease (CKD), if present. Lipid profile (cholesterol, HDL, LDL, triglycerides)  Performed every 5 years for most people.  The goal for LDL is less than 100 mg/dL. If you are at high risk, the goal is less than 70 mg/dL.  The goal for HDL is 40 mg/dL-50 mg/dL for men and 50 mg/dL-60 mg/dL for women. An HDL cholesterol of 60 mg/dL or higher gives some protection against heart disease.  The goal for triglycerides is less than 150 mg/dL. Influenza vaccine, pneumococcal vaccine, and hepatitis B vaccine  The influenza vaccine is recommended yearly.  It is recommended that people with diabetes who are over 24 years old get the pneumonia vaccine. In some cases, two separate shots may be given. Ask your health care provider if your pneumonia vaccination is up to date.  The hepatitis B vaccine is also recommended for adults with diabetes. Diabetes self-management education  Education is recommended at diagnosis and ongoing as needed. Treatment plan  Your treatment plan is reviewed at every medical visit. Document Released: 01/06/2009 Document Revised: 07/26/2013 Document Reviewed: 08/11/2012 [facility] Patient Information 2015 [place],  [employer]. This information is not intended to replace advice given to you by your health care provider. Make sure you discuss any questions you have with your health care provider.  
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
## 2014-08-04 NOTE — Progress Notes (Signed)
Patient ID: Seth Weaver, male   DOB: [birth year], 54 y.o.   MRN: [id number] 1. HTN: Medication: Amlodipine 10 mg and Losartan 50mg  daily. Took it today  Home BP monitoring: does not check Positive ROS blurred vision Negative [id number], chest pain, edema   2. DM2:  Medication: Metformin XR 500 mg  Home CBG monitoring: sporadically  Hypoglycemic event: none Positive ROS none  Negative JFH:[id number], polyuria/dipsia   3. Neck pain/Lower back pain. He has been going to a chiropractor which told him one of the vertebraes in his neck is "out of place". He is currently doing physical therapy and massages. He has not been taking any medication for pain. He denies bowel and bladder dysfunction.  Social History reviewed: Smoker never Exercise not currently   Physical Exam  Constitutional: He is oriented to person, place, and time.  Neck: No JVD present.  Cardiovascular: Normal rate, regular rhythm and normal heart sounds.   Pulmonary/Chest: Effort normal and breath sounds normal.  Musculoskeletal: He exhibits tenderness (lower back). He exhibits no edema.  Neurological: He is alert and oriented to person, place, and time.   Seth Weaver was seen today for follow-up.  Diagnoses and all orders for this visit:  Type 2 diabetes mellitus without complication Orders: -     Glucose (CBG) -     HgB A1c -     Microalbumin, urine -     Tdap vaccine greater than or equal to 7yo IM -     Ambulatory referral to Podiatry Patients diabetes is well control as evidence by consistently low a1c.  Patient will continue with current therapy and continue to make necessary lifestyle changes.  Reviewed foot care, diet, exercise, annual health maintenance with patient.   Essential hypertension BP is not controlled, increased losartan to 100 mg daily.  He will come back in 3 weeks for a recheck with RN.   Cervical pain Continue care with chiropractor   Bilateral low back pain without sciatica Same as  above   Return for 2-3 weeks RN-BP check and 3 mo PCP .  Seth Manning, NP  08/04/2014 10:58 PM

## 2014-08-05 LAB — MICROALBUMIN, URINE: Microalb, Ur: 0.4 mg/dL (ref <2.0)

## 2014-08-10 ENCOUNTER — Telehealth: Payer: Self-pay | Admitting: *Deleted

## 2014-08-10 NOTE — Telephone Encounter (Signed)
Per Seth Weaver let patient know to increase his Losartan from 50 mg every day to 100 mg every day.

## 2014-10-03 ENCOUNTER — Other Ambulatory Visit: Payer: Self-pay | Admitting: Internal Medicine

## 2014-10-03 DIAGNOSIS — I10 Essential (primary) hypertension: Secondary | ICD-10-CM

## 2014-10-07 ENCOUNTER — Encounter: Payer: Self-pay | Admitting: Internal Medicine

## 2014-10-07 ENCOUNTER — Ambulatory Visit: Payer: Self-pay | Attending: Internal Medicine | Admitting: Internal Medicine

## 2014-10-07 VITALS — BP 150/96 | HR 100 | Temp 98.2°F | Resp 18 | Ht 74.0 in | Wt 305.8 lb

## 2014-10-07 DIAGNOSIS — I10 Essential (primary) hypertension: Secondary | ICD-10-CM | POA: Insufficient documentation

## 2014-10-07 DIAGNOSIS — E119 Type 2 diabetes mellitus without complications: Secondary | ICD-10-CM | POA: Insufficient documentation

## 2014-10-07 DIAGNOSIS — Z79899 Other long term (current) drug therapy: Secondary | ICD-10-CM | POA: Insufficient documentation

## 2014-10-07 DIAGNOSIS — K4090 Unilateral inguinal hernia, without obstruction or gangrene, not specified as recurrent: Secondary | ICD-10-CM

## 2014-10-07 HISTORY — DX: Unilateral inguinal hernia, without obstruction or gangrene, not specified as recurrent: K40.90

## 2014-10-07 LAB — GLUCOSE, POCT (MANUAL RESULT ENTRY): POC GLUCOSE: 149 mg/dL — AB (ref 70–99)

## 2014-10-07 MED ORDER — METFORMIN HCL ER 500 MG PO TB24: 500.0000 mg | ORAL_TABLET | Freq: Every day | ORAL | Status: DC

## 2014-10-07 MED ORDER — AMLODIPINE BESYLATE 10 MG PO TABS: 10.0000 mg | ORAL_TABLET | Freq: Every day | ORAL | Status: DC

## 2014-10-07 MED ORDER — LOSARTAN POTASSIUM 100 MG PO TABS: 100.0000 mg | ORAL_TABLET | Freq: Every day | ORAL | Status: DC

## 2014-10-07 NOTE — Patient Instructions (Signed)
I have sent you to [facility] for a surgical consult. The initial fee should be $65 and then you can apply for their patient assistance program. Seth Weaver will call you directly.   Inguinal Hernia, Adult Muscles help keep everything in the body in its proper place. But if a weak spot in the muscles develops, something can poke through. That is called a hernia. When this happens in the lower part of the belly (abdomen), it is called an inguinal hernia. (It takes its name from a part of the body in this region called the inguinal canal.) A weak spot in the wall of muscles lets some fat or part of the small intestine bulge through. An inguinal hernia can develop at any age. Men get them more often than women. CAUSES  In adults, an inguinal hernia develops over time.  It can be triggered by:  Suddenly straining the muscles of the lower abdomen.  Lifting heavy objects.  Straining to have a bowel movement. Difficult bowel movements (constipation) can lead to this.  Constant coughing. This may be caused by smoking or lung disease.  Being overweight.  Being pregnant.  Working at a job that requires long periods of standing or heavy lifting.  Having had an inguinal hernia before. One type can be an emergency situation. It is called a strangulated inguinal hernia. It develops if part of the small intestine slips through the weak spot and cannot get back into the abdomen. The blood supply can be cut off. If that happens, part of the intestine may die. This situation requires emergency surgery. SYMPTOMS  Often, a small inguinal hernia has no symptoms. It is found when a healthcare provider does a physical exam. Larger hernias usually have symptoms.   In adults, symptoms may include:  A lump in the groin. This is easier to see when the person is standing. It might disappear when lying down.  In men, a lump in the scrotum.  Pain or burning in the groin. This occurs especially when  lifting, straining or coughing.  A dull ache or feeling of pressure in the groin.  Signs of a strangulated hernia can include:  A bulge in the groin that becomes very painful and tender to the touch.  A bulge that turns red or purple.  Fever, nausea and vomiting.  Inability to have a bowel movement or to pass gas. DIAGNOSIS  To decide if you have an inguinal hernia, a healthcare provider will probably do a physical examination.  This will include asking questions about any symptoms you have noticed.  The healthcare provider might feel the groin area and ask you to cough. If an inguinal hernia is felt, the healthcare provider may try to slide it back into the abdomen.  Usually no other tests are needed. TREATMENT  Treatments can vary. The size of the hernia makes a difference. Options include:  Watchful waiting. This is often suggested if the hernia is small and you have had no symptoms.  No medical procedure will be done unless symptoms develop.  You will need to watch closely for symptoms. If any occur, contact your healthcare provider right away.  Surgery. This is used if the hernia is larger or you have symptoms.  Open surgery. This is usually an outpatient procedure (you will not stay overnight in a hospital). An cut (incision) is made through the skin in the groin. The hernia is put back inside the abdomen. The weak area in the muscles is then  repaired by herniorrhaphy or hernioplasty. Herniorrhaphy: in this type of surgery, the weak muscles are sewn back together. Hernioplasty: a patch or mesh is used to close the weak area in the abdominal wall.  Laparoscopy. In this procedure, a surgeon makes small incisions. A thin tube with a tiny video camera (called a laparoscope) is put into the abdomen. The surgeon repairs the hernia with mesh by looking with the video camera and using two long instruments. HOME CARE INSTRUCTIONS   After surgery to repair an inguinal hernia:  You  will need to take pain medicine prescribed by your healthcare provider. Follow all directions carefully.  You will need to take care of the wound from the incision.  Your activity will be restricted for awhile. This will probably include no heavy lifting for several weeks. You also should not do anything too active for a few weeks. When you can return to work will depend on the type of job that you have.  During "watchful waiting" periods, you should:  Maintain a healthy weight.  Eat a diet high in fiber (fruits, vegetables and whole grains).  Drink plenty of fluids to avoid constipation. This means drinking enough water and other liquids to keep your urine clear or pale yellow.  Do not lift heavy objects.  Do not stand for long periods of time.  Quit smoking. This should keep you from developing a frequent cough. SEEK MEDICAL CARE IF:   A bulge develops in your groin area.  You feel pain, a burning sensation or pressure in the groin. This might be worse if you are lifting or straining.  You develop a fever of more than 100.5 F (38.1 C). SEEK IMMEDIATE MEDICAL CARE IF:   Pain in the groin increases suddenly.  A bulge in the groin gets bigger suddenly and does not go down.  For men, there is sudden pain in the scrotum. Or, the size of the scrotum increases.  A bulge in the groin area becomes red or purple and is painful to touch.  You have nausea or vomiting that does not go away.  You feel your heart beating much faster than normal.  You cannot have a bowel movement or pass gas.  You develop a fever of more than 102.0 F (38.9 C). Document Released: 07/28/2008 Document Revised: 06/03/2011 Document Reviewed: 07/28/2008 [facility] Patient Information 2015 [place], [place]. This information is not intended to replace advice given to you by your health care provider. Make sure you discuss any questions you have with your health care provider.

## 2014-10-07 NOTE — Progress Notes (Addendum)
Patient ID: Seth Weaver, male   DOB: [birth year], 54 y.o.   MRN: [id number] Subjective:  Seth Weaver is a 54 y.o. male with hypertension.  Patient reports that he has only been taking 50 mg of losartan daily. He was unsure about the directions from the last visit. He complains that he has a inguinal hernia on his right side. He states that he has had this hernia for 2 years but has been unable to get it fixed because he did not have insurance. It is not currently painful unless he coughs or sneeze.  Current Outpatient Prescriptions  Medication Sig Dispense Refill  . amLODipine (NORVASC) 10 MG tablet TAKE 1 TABLET BY MOUTH DAILY. 30 tablet 3  . Blood Glucose Monitoring Suppl (BLOOD GLUCOSE METER KIT AND SUPPLIES) Dispense based on patient and insurance preference. Test blood sugar once daily as directed. (FOR ICD-9 250.00, 250.01). 1 each 0  . losartan (COZAAR) 100 MG tablet Take 0.5 tablets (50 mg total) by mouth daily. 30 tablet 4  . metFORMIN (GLUCOPHAGE XR) 500 MG 24 hr tablet Take 1 tablet (500 mg total) by mouth daily with breakfast. 60 tablet 5  . cyclobenzaprine (FLEXERIL) 10 MG tablet Take 1 tablet (10 mg total) by mouth 3 (three) times daily as needed for muscle spasms. (Patient not taking: Reported on 08/04/2014) 90 tablet 0  . gabapentin (NEURONTIN) 300 MG capsule Take 1 capsule (300 mg total) by mouth 3 (three) times daily. (Patient not taking: Reported on 10/07/2014) 90 capsule 2  . ibuprofen (ADVIL,MOTRIN) 600 MG tablet Take 1 tablet (600 mg total) by mouth every 8 (eight) hours as needed. (Patient not taking: Reported on 08/04/2014) 60 tablet 0  . traMADol (ULTRAM) 50 MG tablet Take 1 tablet (50 mg total) by mouth every 6 (six) hours as needed. (Patient not taking: Reported on 08/04/2014) 60 tablet 0   No current facility-administered medications for this visit.    Hypertension ROS: taking medications as instructed, no medication side effects noted, no TIA's, no chest pain on exertion, no  dyspnea on exertion and no swelling of ankles.   Objective:  BP 150/96 mmHg  Pulse 100  Temp(Src) 98.2 F (36.8 C) (Oral)  Resp 18  Ht $R'6\' 2"'is$  (1.88 m)  Wt 305 lb 12.8 oz (138.71 kg)  BMI 39.25 kg/m2  SpO2 97%  Appearance alert, well appearing, and in no distress, oriented to person, place, and time and overweight. Large right inguinal hernia.  General exam BP noted to be elevated today in office, S1, S2 normal, no gallop, no murmur, chest clear, no JVD, no HSM, no edema.  Lab review: labs are reviewed, up to date and normal.   Assessment:   Hypertension needs improvement and needs to follow diet more regularly.  DM: continue current therapy  Inguinal hernia: General surgery referral placed to [facility].   Plan:  Reviewed diet, exercise and weight control. Patient will increase to 100 mg of losartan and we will recheck in 2 weeks.  Return in about 3 weeks (around 10/28/2014) for Nurse Visit--RN visit and 3 mo PCP .  Seth Bosch, NP 10/07/2014 2:00 PM

## 2014-10-07 NOTE — Progress Notes (Signed)
Patient here for hernia located in his groin area. Patient denies any pain today. CBG is 149. Patient has taken his losartan and metformin for today. Patient needs refills on losartan and amlodipine.

## 2014-10-31 ENCOUNTER — Ambulatory Visit: Payer: Self-pay | Attending: Internal Medicine | Admitting: *Deleted

## 2014-10-31 VITALS — BP 138/85 | HR 99 | Temp 98.6°F | Resp 16 | Ht 74.0 in | Wt 307.2 lb

## 2014-10-31 DIAGNOSIS — I10 Essential (primary) hypertension: Secondary | ICD-10-CM | POA: Insufficient documentation

## 2014-10-31 NOTE — Patient Instructions (Signed)
DASH Eating Plan °DASH stands for "Dietary Approaches to Stop Hypertension." The DASH eating plan is a healthy eating plan that has been shown to reduce high blood pressure (hypertension). Additional health benefits may include reducing the risk of type 2 diabetes mellitus, heart disease, and stroke. The DASH eating plan may also help with weight loss. °WHAT DO I NEED TO KNOW ABOUT THE DASH EATING PLAN? °For the DASH eating plan, you will follow these general guidelines: °· Choose foods with a percent daily value for sodium of less than 5% (as listed on the food label). °· Use salt-free seasonings or herbs instead of table salt or sea salt. °· Check with your health care provider or pharmacist before using salt substitutes. °· Eat lower-sodium products, often labeled as "lower sodium" or "no salt added." °· Eat fresh foods. °· Eat more vegetables, fruits, and low-fat dairy products. °· Choose whole grains. Look for the word "whole" as the first word in the ingredient list. °· Choose fish and skinless chicken or turkey more often than red meat. Limit fish, poultry, and meat to 6 oz (170 g) each day. °· Limit sweets, desserts, sugars, and sugary drinks. °· Choose heart-healthy fats. °· Limit cheese to 1 oz (28 g) per day. °· Eat more home-cooked food and less restaurant, buffet, and fast food. °· Limit fried foods. °· Cook foods using methods other than frying. °· Limit canned vegetables. If you do use them, rinse them well to decrease the sodium. °· When eating at a restaurant, ask that your food be prepared with less salt, or no salt if possible. °WHAT FOODS CAN I EAT? °Seek help from a dietitian for individual calorie needs. °Grains °Whole grain or whole wheat bread. Brown rice. Whole grain or whole wheat pasta. Quinoa, bulgur, and whole grain cereals. Low-sodium cereals. Corn or whole wheat flour tortillas. Whole grain cornbread. Whole grain crackers. Low-sodium crackers. °Vegetables °Fresh or frozen vegetables  (raw, steamed, roasted, or grilled). Low-sodium or reduced-sodium tomato and vegetable juices. Low-sodium or reduced-sodium tomato sauce and paste. Low-sodium or reduced-sodium canned vegetables.  °Fruits °All fresh, canned (in natural juice), or frozen fruits. °Meat and Other Protein Products °Ground beef (85% or leaner), grass-fed beef, or beef trimmed of fat. Skinless chicken or turkey. Ground chicken or turkey. Pork trimmed of fat. All fish and seafood. Eggs. Dried beans, peas, or lentils. Unsalted nuts and seeds. Unsalted canned beans. °Dairy °Low-fat dairy products, such as skim or 1% milk, 2% or reduced-fat cheeses, low-fat ricotta or cottage cheese, or plain low-fat yogurt. Low-sodium or reduced-sodium cheeses. °Fats and Oils °Tub margarines without trans fats. Light or reduced-fat mayonnaise and salad dressings (reduced sodium). Avocado. Safflower, olive, or canola oils. Natural peanut or almond butter. °Other °Unsalted popcorn and pretzels. °The items listed above may not be a complete list of recommended foods or beverages. Contact your dietitian for more options. °WHAT FOODS ARE NOT RECOMMENDED? °Grains °White bread. White pasta. White rice. Refined cornbread. Bagels and croissants. Crackers that contain trans fat. °Vegetables °Creamed or fried vegetables. Vegetables in a cheese sauce. Regular canned vegetables. Regular canned tomato sauce and paste. Regular tomato and vegetable juices. °Fruits °Dried fruits. Canned fruit in light or heavy syrup. Fruit juice. °Meat and Other Protein Products °Fatty cuts of meat. Ribs, chicken wings, bacon, sausage, bologna, salami, chitterlings, fatback, hot dogs, bratwurst, and packaged luncheon meats. Salted nuts and seeds. Canned beans with salt. °Dairy °Whole or 2% milk, cream, half-and-half, and cream cheese. Whole-fat or sweetened yogurt. Full-fat   cheeses or blue cheese. Nondairy creamers and whipped toppings. Processed cheese, cheese spreads, or cheese  curds. °Condiments °Onion and garlic salt, seasoned salt, table salt, and sea salt. Canned and packaged gravies. Worcestershire sauce. Tartar sauce. Barbecue sauce. Teriyaki sauce. Soy sauce, including reduced sodium. Steak sauce. Fish sauce. Oyster sauce. Cocktail sauce. Horseradish. Ketchup and mustard. Meat flavorings and tenderizers. Bouillon cubes. Hot sauce. Tabasco sauce. Marinades. Taco seasonings. Relishes. °Fats and Oils °Butter, stick margarine, lard, shortening, ghee, and bacon fat. Coconut, palm kernel, or palm oils. Regular salad dressings. °Other °Pickles and olives. Salted popcorn and pretzels. °The items listed above may not be a complete list of foods and beverages to avoid. Contact your dietitian for more information. °WHERE CAN I FIND MORE INFORMATION? °National Heart, [employer], [employer] Institute: www.nhlbi.nih.gov/health/health-topics/topics/dash/ °Document Released: 02/28/2011 Document Revised: 07/26/2013 Document Reviewed: 01/13/2013 °ExitCare® Patient Information ©2015 [employer], [employer]. This information is not intended to replace advice given to you by your health care provider. Make sure you discuss any questions you have with your health care provider. ° °
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
## 2014-11-07 DIAGNOSIS — E669 Obesity, unspecified: Secondary | ICD-10-CM | POA: Insufficient documentation

## 2014-11-07 DIAGNOSIS — K4020 Bilateral inguinal hernia, without obstruction or gangrene, not specified as recurrent: Secondary | ICD-10-CM | POA: Insufficient documentation

## 2015-01-04 DIAGNOSIS — Z09 Encounter for follow-up examination after completed treatment for conditions other than malignant neoplasm: Secondary | ICD-10-CM | POA: Insufficient documentation

## 2015-02-21 ENCOUNTER — Encounter: Payer: Self-pay | Admitting: Pharmacist

## 2015-05-08 ENCOUNTER — Ambulatory Visit: Payer: Self-pay | Attending: Internal Medicine | Admitting: Internal Medicine

## 2015-05-08 ENCOUNTER — Encounter: Payer: Self-pay | Admitting: Internal Medicine

## 2015-05-08 VITALS — BP 158/105 | HR 97 | Temp 98.0°F | Resp 16 | Ht 74.0 in | Wt 304.0 lb

## 2015-05-08 DIAGNOSIS — I10 Essential (primary) hypertension: Secondary | ICD-10-CM

## 2015-05-08 DIAGNOSIS — Z7984 Long term (current) use of oral hypoglycemic drugs: Secondary | ICD-10-CM | POA: Insufficient documentation

## 2015-05-08 DIAGNOSIS — N529 Male erectile dysfunction, unspecified: Secondary | ICD-10-CM

## 2015-05-08 DIAGNOSIS — E119 Type 2 diabetes mellitus without complications: Secondary | ICD-10-CM

## 2015-05-08 LAB — BASIC METABOLIC PANEL
BUN: 9 mg/dL (ref 7–25)
CHLORIDE: 102 mmol/L (ref 98–110)
CO2: 32 mmol/L — ABNORMAL HIGH (ref 20–31)
CREATININE: 0.99 mg/dL (ref 0.70–1.33)
Calcium: 8.9 mg/dL (ref 8.6–10.3)
Glucose, Bld: 107 mg/dL — ABNORMAL HIGH (ref 65–99)
POTASSIUM: 4 mmol/L (ref 3.5–5.3)
SODIUM: 140 mmol/L (ref 135–146)

## 2015-05-08 LAB — GLUCOSE, POCT (MANUAL RESULT ENTRY): POC GLUCOSE: 143 mg/dL — AB (ref 70–99)

## 2015-05-08 LAB — POCT GLYCOSYLATED HEMOGLOBIN (HGB A1C): HEMOGLOBIN A1C: 6.1

## 2015-05-08 MED ORDER — METFORMIN HCL ER 500 MG PO TB24: 500.0000 mg | ORAL_TABLET | Freq: Every day | ORAL | Status: DC

## 2015-05-08 MED ORDER — AMLODIPINE BESYLATE 10 MG PO TABS: 10.0000 mg | ORAL_TABLET | Freq: Every day | ORAL | Status: DC

## 2015-05-08 MED ORDER — SILDENAFIL CITRATE 25 MG PO TABS: 25.0000 mg | ORAL_TABLET | Freq: Every day | ORAL | Status: DC | PRN

## 2015-05-08 MED ORDER — LOSARTAN POTASSIUM 100 MG PO TABS: 100.0000 mg | ORAL_TABLET | Freq: Every day | ORAL | Status: DC

## 2015-05-08 MED FILL — METFORMIN HCL ER 500 MG TAB: 500 | 30 days supply | Qty: 30 | Fill #0

## 2015-05-08 MED FILL — LOSARTAN POTASSIUM 100 MG T: 100 | 30 days supply | Qty: 30 | Fill #0

## 2015-05-08 MED FILL — ?AMLODIPINE BESYLATE 10 MG: 10 | 30 days supply | Qty: 30 | Fill #0

## 2015-05-08 NOTE — Progress Notes (Signed)
Patient ID: Seth Weaver, male   DOB: [birth year], 55 y.o.   MRN: [id number] SUBJECTIVE: 55 y.o. male for follow up of diabetes and hypertension. Diabetic Review of Systems - medication compliance: compliant all of the time, diabetic diet compliance: compliant all of the time, home glucose monitoring: is performed sporadically, further diabetic ROS: no polyuria or polydipsia, no chest pain, dyspnea or TIA's, no numbness, tingling or pain in extremities, no unusual visual symptoms, no hypoglycemia.  Other symptoms and concerns: Has not taken blood pressure medication in over one week because it has been causing him erectile dysfunction. Patient states that since beginning Amlodipine he has noticed gradual worsening of erectile dysfunction over the past 2 months. He is requesting a prescription for Viagra.  Current Outpatient Prescriptions  Medication Sig Dispense Refill  . amLODipine (NORVASC) 10 MG tablet Take 1 tablet (10 mg total) by mouth daily. 30 tablet 3  . losartan (COZAAR) 100 MG tablet Take 1 tablet (100 mg total) by mouth daily. 30 tablet 4  . metFORMIN (GLUCOPHAGE XR) 500 MG 24 hr tablet Take 1 tablet (500 mg total) by mouth daily with breakfast. 60 tablet 5  . Blood Glucose Monitoring Suppl (BLOOD GLUCOSE METER KIT AND SUPPLIES) Dispense based on patient and insurance preference. Test blood sugar once daily as directed. (FOR ICD-9 250.00, 250.01). 1 each 0  . cyclobenzaprine (FLEXERIL) 10 MG tablet Take 1 tablet (10 mg total) by mouth 3 (three) times daily as needed for muscle spasms. (Patient not taking: Reported on 08/04/2014) 90 tablet 0  . gabapentin (NEURONTIN) 300 MG capsule Take 1 capsule (300 mg total) by mouth 3 (three) times daily. (Patient not taking: Reported on 10/07/2014) 90 capsule 2  . ibuprofen (ADVIL,MOTRIN) 600 MG tablet Take 1 tablet (600 mg total) by mouth every 8 (eight) hours as needed. (Patient not taking: Reported on 08/04/2014) 60 tablet 0  . traMADol (ULTRAM) 50 MG  tablet Take 1 tablet (50 mg total) by mouth every 6 (six) hours as needed. (Patient not taking: Reported on 08/04/2014) 60 tablet 0   No current facility-administered medications for this visit.  Review of Systems: Other than what is stated in HPI, all other systems are negative.   OBJECTIVE: Appearance: alert, well appearing, and in no distress, oriented to person, place, and time and overweight. BP 158/105 mmHg  Pulse 97  Temp(Src) 98 F (36.7 C)  Resp 16  Ht '6\' 2"'$  (1.88 m)  Wt 304 lb (137.893 kg)  BMI 39.01 kg/m2  SpO2 100%  Exam: heart sounds normal rate, regular rhythm, normal S1, S2, no murmurs, rubs, clicks or gallops, no JVD, chest clear, no carotid bruits, feet: warm, good capillary refill, no trophic changes or ulcerative lesions, normal DP and PT pulses, normal monofilament exam and normal sensory exam  ASSESSMENT: [employer] was seen today for follow-up.  Diagnoses and all orders for this visit:  Type 2 diabetes mellitus without complication, without long-term current use of insulin (HCC) -     Glucose (CBG) -     HgB A1c -     Refilled metFORMIN (GLUCOPHAGE XR) 500 MG 24 hr tablet; Take 1 tablet (500 mg total) by mouth daily with breakfast. -     Basic Metabolic Panel Patients diabetes is well control as evidence by consistently low a1c.  Patient will continue with current therapy and continue to make necessary lifestyle changes.  Reviewed foot care, diet, exercise, annual health maintenance with patient.   Essential hypertension -  amLODipine (NORVASC) 10 MG tablet; Take 1 tablet (10 mg total) by mouth daily. -     losartan (COZAAR) 100 MG tablet; Take 1 tablet (100 mg total) by mouth daily. BP is currently not controlled because patient has been non-complaint with regimen. I have addressed long term complications of uncontrolled HTN with patient. Stressed weight loss, exercise, and DASH diet.   Erectile dysfunction, unspecified erectile dysfunction type -      sildenafil (VIAGRA) 25 MG tablet; Take 1 tablet (25 mg total) by mouth daily as needed for erectile dysfunction. I have given patient the option to switch off Amlodipine but he states that he will take it if he is given Viagra to use when needed. I have explained that if he continues to have problems he may call back and we can d/c Amlodipine.    Return in about 3 months (around 08/05/2015).   Lance Bosch, NP 05/09/2015 10:26 AM

## 2015-05-08 NOTE — Progress Notes (Signed)
Patient here for follow up on his diabetes and HTN Patient presents with elevated blood pressure but stated Had a personal matter to take care  Of and will take the medication When he gets home

## 2015-05-10 ENCOUNTER — Telehealth: Payer: Self-pay

## 2015-05-10 NOTE — Telephone Encounter (Signed)
-----   Message from Lance Bosch, NP sent at 05/09/2015  4:39 PM EST ----- Labs ok

## 2015-05-10 NOTE — Telephone Encounter (Signed)
Tried to contact patient this am Patient not available  Message left on voice mail to return our call 

## 2015-06-21 MED FILL — AMLODIPINE BESYLATE 10 MG T: 10 | 30 days supply | Qty: 30 | Fill #1

## 2015-06-21 MED FILL — LOSARTAN POTASSIUM 100 MG T: 100 | 30 days supply | Qty: 30 | Fill #1

## 2015-06-21 MED FILL — METFORMIN HCL ER 500 MG TAB: 500 | 30 days supply | Qty: 30 | Fill #1

## 2015-08-14 ENCOUNTER — Encounter: Payer: Self-pay | Admitting: Internal Medicine

## 2015-08-14 ENCOUNTER — Ambulatory Visit: Payer: Medicaid Other | Attending: Internal Medicine | Admitting: Internal Medicine

## 2015-08-14 VITALS — BP 149/97 | HR 115 | Temp 98.7°F | Resp 18 | Ht 74.0 in | Wt 297.0 lb

## 2015-08-14 DIAGNOSIS — I10 Essential (primary) hypertension: Secondary | ICD-10-CM | POA: Insufficient documentation

## 2015-08-14 DIAGNOSIS — Z79891 Long term (current) use of opiate analgesic: Secondary | ICD-10-CM | POA: Insufficient documentation

## 2015-08-14 DIAGNOSIS — Z791 Long term (current) use of non-steroidal anti-inflammatories (NSAID): Secondary | ICD-10-CM | POA: Insufficient documentation

## 2015-08-14 DIAGNOSIS — Z7984 Long term (current) use of oral hypoglycemic drugs: Secondary | ICD-10-CM | POA: Insufficient documentation

## 2015-08-14 DIAGNOSIS — R7303 Prediabetes: Secondary | ICD-10-CM | POA: Diagnosis not present

## 2015-08-14 DIAGNOSIS — Z79899 Other long term (current) drug therapy: Secondary | ICD-10-CM | POA: Insufficient documentation

## 2015-08-14 DIAGNOSIS — Z8249 Family history of ischemic heart disease and other diseases of the circulatory system: Secondary | ICD-10-CM | POA: Insufficient documentation

## 2015-08-14 DIAGNOSIS — Z9889 Other specified postprocedural states: Secondary | ICD-10-CM | POA: Diagnosis not present

## 2015-08-14 DIAGNOSIS — Z77098 Contact with and (suspected) exposure to other hazardous, chiefly nonmedicinal, chemicals: Secondary | ICD-10-CM | POA: Insufficient documentation

## 2015-08-14 DIAGNOSIS — Z833 Family history of diabetes mellitus: Secondary | ICD-10-CM | POA: Insufficient documentation

## 2015-08-14 LAB — GLUCOSE, POCT (MANUAL RESULT ENTRY): POC GLUCOSE: 111 mg/dL — AB (ref 70–99)

## 2015-08-14 MED ORDER — AMLODIPINE BESYLATE 10 MG PO TABS: 10.0000 mg | ORAL_TABLET | Freq: Every day | ORAL | Status: DC

## 2015-08-14 MED ORDER — LOSARTAN POTASSIUM 100 MG PO TABS: 100.0000 mg | ORAL_TABLET | Freq: Every day | ORAL | Status: DC

## 2015-08-14 MED ORDER — HYDROCHLOROTHIAZIDE 25 MG PO TABS: 25.0000 mg | ORAL_TABLET | Freq: Every day | ORAL | Status: DC

## 2015-08-14 NOTE — Patient Instructions (Signed)
Low-Sodium Eating Plan Sodium raises blood pressure and causes water to be held in the body. Getting less sodium from food will help lower your blood pressure, reduce any swelling, and protect your heart, liver, and kidneys. We get sodium by adding salt (sodium chloride) to food. Most of our sodium comes from canned, boxed, and frozen foods. Restaurant foods, fast foods, and pizza are also very high in sodium. Even if you take medicine to lower your blood pressure or to reduce fluid in your body, getting less sodium from your food is important. WHAT IS MY PLAN? Most people should limit their sodium intake to 2,300 mg a day. Your health care provider recommends that you limit your sodium intake to __________ a day.  WHAT DO I NEED TO KNOW ABOUT THIS EATING PLAN? For the low-sodium eating plan, you will follow these general guidelines:  Choose foods with a % Daily Value for sodium of less than 5% (as listed on the food label).   Use salt-free seasonings or herbs instead of table salt or sea salt.   Check with your health care provider or pharmacist before using salt substitutes.   Eat fresh foods.  Eat more vegetables and fruits.  Limit canned vegetables. If you do use them, rinse them well to decrease the sodium.   Limit cheese to 1 oz (28 g) per day.   Eat lower-sodium products, often labeled as "lower sodium" or "no salt added."  Avoid foods that contain monosodium glutamate (MSG). MSG is sometimes added to [place] food and some canned foods.  Check food labels (Nutrition Facts labels) on foods to learn how much sodium is in one serving.  Eat more home-cooked food and less restaurant, buffet, and fast food.  When eating at a restaurant, ask that your food be prepared with less salt, or no salt if possible.  HOW DO I READ FOOD LABELS FOR SODIUM INFORMATION? The Nutrition Facts label lists the amount of sodium in one serving of the food. If you eat more than one serving, you  must multiply the listed amount of sodium by the number of servings. Food labels may also identify foods as:  Sodium free--Less than 5 mg in a serving.  Very low sodium--35 mg or less in a serving.  Low sodium--140 mg or less in a serving.  Light in sodium--50% less sodium in a serving. For example, if a food that usually has 300 mg of sodium is changed to become light in sodium, it will have 150 mg of sodium.  Reduced sodium--25% less sodium in a serving. For example, if a food that usually has 400 mg of sodium is changed to reduced sodium, it will have 300 mg of sodium. WHAT FOODS CAN I EAT? Grains Low-sodium cereals, including oats, puffed wheat and rice, and shredded wheat cereals. Low-sodium crackers. Unsalted rice and pasta. Lower-sodium bread.  Vegetables Frozen or fresh vegetables. Low-sodium or reduced-sodium canned vegetables. Low-sodium or reduced-sodium tomato sauce and paste. Low-sodium or reduced-sodium tomato and vegetable juices.  Fruits Fresh, frozen, and canned fruit. Fruit juice.  Meat and Other Protein Products Low-sodium canned tuna and salmon. Fresh or frozen meat, poultry, seafood, and fish. Lamb. Unsalted nuts. Dried beans, peas, and lentils without added salt. Unsalted canned beans. Homemade soups without salt. Eggs.  Dairy Milk. Soy milk. Ricotta cheese. Low-sodium or reduced-sodium cheeses. Yogurt.  Condiments Fresh and dried herbs and spices. Salt-free seasonings. Onion and garlic powders. Low-sodium varieties of mustard and ketchup. Fresh or refrigerated horseradish. [place]  juice.  Fats and Oils Reduced-sodium salad dressings. Unsalted butter.  Other Unsalted popcorn and pretzels.  The items listed above may not be a complete list of recommended foods or beverages. Contact your dietitian for more options. WHAT FOODS ARE NOT RECOMMENDED? Grains Instant hot cereals. Bread stuffing, pancake, and biscuit mixes. Croutons. Seasoned rice or pasta mixes.  Noodle soup cups. Boxed or frozen macaroni and cheese. Self-rising flour. Regular salted crackers. Vegetables Regular canned vegetables. Regular canned tomato sauce and paste. Regular tomato and vegetable juices. Frozen vegetables in sauces. Salted [place] fries. Olives. Angie Fava. Relishes. Sauerkraut. Salsa. Meat and Other Protein Products Salted, canned, smoked, spiced, or pickled meats, seafood, or fish. Bacon, ham, sausage, hot dogs, corned beef, chipped beef, and packaged luncheon meats. Salt pork. Jerky. Pickled herring. Anchovies, regular canned tuna, and sardines. Salted nuts. Dairy Processed cheese and cheese spreads. Cheese curds. Blue cheese and cottage cheese. Buttermilk.  Condiments Onion and garlic salt, seasoned salt, table salt, and sea salt. Canned and packaged gravies. Worcestershire sauce. Tartar sauce. Barbecue sauce. Teriyaki sauce. Soy sauce, including reduced sodium. Steak sauce. Fish sauce. Oyster sauce. Cocktail sauce. Horseradish that you find on the shelf. Regular ketchup and mustard. Meat flavorings and tenderizers. Bouillon cubes. Hot sauce. Tabasco sauce. Marinades. Taco seasonings. Relishes. Fats and Oils Regular salad dressings. Salted butter. Margarine. Ghee. Bacon fat.  Other Potato and tortilla chips. Corn chips and puffs. Salted popcorn and pretzels. Canned or dried soups. Pizza. Frozen entrees and pot pies.  The items listed above may not be a complete list of foods and beverages to avoid. Contact your dietitian for more information.   This information is not intended to replace advice given to you by your health care provider. Make sure you discuss any questions you have with your health care provider.   Document Released: 08/31/2001 Document Revised: 04/01/2014 Document Reviewed: 01/13/2013 Elsevier Interactive Patient Education 2016 [place] for Eating Away From Home If You Have Diabetes Controlling your level of blood glucose, also  known as blood sugar, can be challenging. It can be even more difficult when you do not prepare your own meals. The following tips can help you manage your diabetes when you eat away from home. PLANNING AHEAD Plan ahead if you know you will be eating away from home:  Ask your health care provider how to time meals and medicine if you are taking insulin.  Make a list of restaurants near you that offer healthy choices. If they have a carry-out menu, take it home and plan what you will order ahead of time.  Look up the restaurant you want to eat at online. Many chain and fast-food restaurants list nutritional information online. Use this information to choose the healthiest options and to calculate how many carbohydrates will be in your meal.  Use a carbohydrate-counting book or mobile app to look up the carbohydrate content and serving size of the foods you want to eat.  Become familiar with serving sizes and learn to recognize how many servings are in a portion. This will allow you to estimate how many carbohydrates you can eat. FREE FOODS A "free food" is any food or drink that has less than 5 g of carbohydrates per serving. Free foods include:  Many vegetables.  Hard boiled eggs.  Nuts or seeds.  Olives.  Cheeses.  Meats. These types of foods make good appetizer choices and are often available at salad bars. Lemon juice, vinegar, or a low-calorie salad dressing of fewer  than 20 calories per serving can be used as a "free" salad dressing.  CHOICES TO REDUCE CARBOHYDRATES  Substitute nonfat sweetened yogurt with a sugar-free yogurt. Yogurt made from soy milk may also be used, but you will still want a sugar-free or plain option to choose a lower carbohydrate amount.  Ask your server to take away the bread basket or chips from your table.  Order fresh fruit. A salad bar often offers fresh fruit choices. Avoid canned fruit because it is usually packed in sugar or syrup.  Order a  salad, and eat it without dressing. Or, create a "free" salad dressing.  Ask for substitutions. For example, instead of [place] fries, request an order of a vegetable such as salad, green beans, or broccoli. OTHER TIPS   If you take insulin, take the insulin once your food arrives to your table. This will ensure your insulin and food are timed correctly.  Ask your server about the portion size before your order, and ask for a take-out box if the portion has more servings than you should have. When your food comes, leave the amount you should have on the plate, and put the rest in the take-out box.  Consider splitting an entree with someone and ordering a side salad.   This information is not intended to replace advice given to you by your health care provider. Make sure you discuss any questions you have with your health care provider.   Document Released: 03/11/2005 Document Revised: 11/30/2014 Document Reviewed: 06/08/2013 Elsevier Interactive Patient Education 2016 [employer] - Diabetes Mellitus and Food It is important for you to manage your blood sugar (glucose) level. Your blood glucose level can be greatly affected by what you eat. Eating healthier foods in the appropriate amounts throughout the day at about the same time each day will help you control your blood glucose level. It can also help slow or prevent worsening of your diabetes mellitus. Healthy eating may even help you improve the level of your blood pressure and reach or maintain a healthy weight.  General recommendations for healthful eating and cooking habits include:  Eating meals and snacks regularly. Avoid going long periods of time without eating to lose weight.  Eating a diet that consists mainly of plant-based foods, such as fruits, vegetables, nuts, legumes, and whole grains.  Using low-heat cooking methods, such as baking, instead of high-heat cooking methods, such as deep frying. Work with your dietitian to  make sure you understand how to use the Nutrition Facts information on food labels. HOW CAN FOOD AFFECT ME? Carbohydrates Carbohydrates affect your blood glucose level more than any other type of food. Your dietitian will help you determine how many carbohydrates to eat at each meal and teach you how to count carbohydrates. Counting carbohydrates is important to keep your blood glucose at a healthy level, especially if you are using insulin or taking certain medicines for diabetes mellitus. Alcohol Alcohol can cause sudden decreases in blood glucose (hypoglycemia), especially if you use insulin or take certain medicines for diabetes mellitus. Hypoglycemia can be a life-threatening condition. Symptoms of hypoglycemia (sleepiness, dizziness, and disorientation) are similar to symptoms of having too much alcohol.  If your health care provider has given you approval to drink alcohol, do so in moderation and use the following guidelines:  Women should not have more than one drink per day, and men should not have more than two drinks per day. One drink is equal to:  12 oz of beer.  5 oz of wine.  1 oz of hard liquor.  Do not drink on an empty stomach.  Keep yourself hydrated. Have water, diet soda, or unsweetened iced tea.  Regular soda, juice, and other mixers might contain a lot of carbohydrates and should be counted. WHAT FOODS ARE NOT RECOMMENDED? As you make food choices, it is important to remember that all foods are not the same. Some foods have fewer nutrients per serving than other foods, even though they might have the same number of calories or carbohydrates. It is difficult to get your body what it needs when you eat foods with fewer nutrients. Examples of foods that you should avoid that are high in calories and carbohydrates but low in nutrients include:  Trans fats (most processed foods list trans fats on the Nutrition Facts label).  Regular soda.  Juice.  Candy.  Sweets,  such as cake, pie, doughnuts, and cookies.  Fried foods. WHAT FOODS CAN I EAT? Eat nutrient-rich foods, which will nourish your body and keep you healthy. The food you should eat also will depend on several factors, including:  The calories you need.  The medicines you take.  Your weight.  Your blood glucose level.  Your blood pressure level.  Your cholesterol level. You should eat a variety of foods, including:  Protein.  Lean cuts of meat.  Proteins low in saturated fats, such as fish, egg whites, and beans. Avoid processed meats.  Fruits and vegetables.  Fruits and vegetables that may help control blood glucose levels, such as apples, mangoes, and yams.  Dairy products.  Choose fat-free or low-fat dairy products, such as milk, yogurt, and cheese.  Grains, bread, pasta, and rice.  Choose whole grain products, such as multigrain bread, whole oats, and brown rice. These foods may help control blood pressure.  Fats.  Foods containing healthful fats, such as nuts, avocado, olive oil, canola oil, and fish. DOES EVERYONE WITH DIABETES MELLITUS HAVE THE SAME MEAL PLAN? Because every person with diabetes mellitus is different, there is not one meal plan that works for everyone. It is very important that you meet with a dietitian who will help you create a meal plan that is just right for you.   This information is not intended to replace advice given to you by your health care provider. Make sure you discuss any questions you have with your health care provider.   Document Released: 12/06/2004 Document Revised: 04/01/2014 Document Reviewed: 02/05/2013 Elsevier Interactive Patient Education 2016 [employer].  - Diabetes and Exercise Exercising regularly is important. It is not just about losing weight. It has many health benefits, such as:  Improving your overall fitness, flexibility, and endurance.  Increasing your bone density.  Helping with weight  control.  Decreasing your body fat.  Increasing your muscle strength.  Reducing stress and tension.  Improving your overall health. People with diabetes who exercise gain additional benefits because exercise:  Reduces appetite.  Improves the body's use of blood sugar (glucose).  Helps lower or control blood glucose.  Decreases blood pressure.  Helps control blood lipids (such as cholesterol and triglycerides).  Improves the body's use of the hormone insulin by:  Increasing the body's insulin sensitivity.  Reducing the body's insulin needs.  Decreases the risk for heart disease because exercising:  Lowers cholesterol and triglycerides levels.  Increases the levels of good cholesterol (such as high-density lipoproteins [HDL]) in the body.  Lowers blood glucose levels. YOUR ACTIVITY PLAN  Choose an activity that  you enjoy, and set realistic goals. To exercise safely, you should begin practicing any new physical activity slowly, and gradually increase the intensity of the exercise over time. Your health care provider or diabetes educator can help create an activity plan that works for you. General recommendations include:  Encouraging children to engage in at least 60 minutes of physical activity each day.  Stretching and performing strength training exercises, such as yoga or weight lifting, at least 2 times per week.  Performing a total of at least 150 minutes of moderate-intensity exercise each week, such as brisk walking or water aerobics.  Exercising at least 3 days per week, making sure you allow no more than 2 consecutive days to pass without exercising.  Avoiding long periods of inactivity (90 minutes or more). When you have to spend an extended period of time sitting down, take frequent breaks to walk or stretch. RECOMMENDATIONS FOR EXERCISING WITH TYPE 1 OR TYPE 2 DIABETES   Check your blood glucose before exercising. If blood glucose levels are greater than 240  mg/dL, check for urine ketones. Do not exercise if ketones are present.  Avoid injecting insulin into areas of the body that are going to be exercised. For example, avoid injecting insulin into:  The arms when playing tennis.  The legs when jogging.  Keep a record of:  Food intake before and after you exercise.  Expected peak times of insulin action.  Blood glucose levels before and after you exercise.  The type and amount of exercise you have done.  Review your records with your health care provider. Your health care provider will help you to develop guidelines for adjusting food intake and insulin amounts before and after exercising.  If you take insulin or oral hypoglycemic agents, watch for signs and symptoms of hypoglycemia. They include:  Dizziness.  Shaking.  Sweating.  Chills.  Confusion.  Drink plenty of water while you exercise to prevent dehydration or heat stroke. Body water is lost during exercise and must be replaced.  Talk to your health care provider before starting an exercise program to make sure it is safe for you. Remember, almost any type of activity is better than none.   This information is not intended to replace advice given to you by your health care provider. Make sure you discuss any questions you have with your health care provider.   Document Released: 06/01/2003 Document Revised: 07/26/2014 Document Reviewed: 08/18/2012 Elsevier Interactive Patient Education [employer].

## 2015-08-14 NOTE — Progress Notes (Signed)
Patient is here for FU BP  Patient denies pain at this time.  Patient is requesting refills on medication.  Patient has not taken medication today and patient has not eaten.

## 2015-08-14 NOTE — Progress Notes (Signed)
Seth Weaver, is a 55 y.o. male  NFA:[id number]  ION:[id number]  DOB - Aug 26, 1960  CC:  Chief Complaint  Patient presents with  . Chemical Exposure    BP       HPI: Seth Weaver is a 55 y.o. male here today to establish medical care, last seen in clinic w/ NP on 05/08/15. Per pt, had bilateral hernia repairs on 08/03/15 and doing well since.  Still not allowed to do heavy lifting, but is walking a bit more everyday.  Pt states he tries to eat low salt meal, but his wife does like a lot of salt on her food.  Pt does much of cooking at home, but will go out to restaurant/fast food about 1 /wk.  Not walking or exercizing much since the hernia surgeries, but he use to walk his dog about 10-42mns daily prior.  He denies smoking or drinking. Patient has No headache, No chest pain, No abdominal pain - No Nausea, No new weakness tingling or numbness, No Cough - SOB.  Has not had colonoscopy yet, waiting for the hernia surgeries to recover and his medicaid to kick in.  No Known Allergies Past Medical History  Diagnosis Date  . Hypertension   . Diabetes mellitus without complication ([facility]    Current Outpatient Prescriptions on File Prior to Visit  Medication Sig Dispense Refill  . Blood Glucose Monitoring Suppl (BLOOD GLUCOSE METER KIT AND SUPPLIES) Dispense based on patient and insurance preference. Test blood sugar once daily as directed. (FOR ICD-9 250.00, 250.01). 1 each 0  . metFORMIN (GLUCOPHAGE XR) 500 MG 24 hr tablet Take 1 tablet (500 mg total) by mouth daily with breakfast. 60 tablet 5  . sildenafil (VIAGRA) 25 MG tablet Take 1 tablet (25 mg total) by mouth daily as needed for erectile dysfunction. 30 tablet 3  . cyclobenzaprine (FLEXERIL) 10 MG tablet Take 1 tablet (10 mg total) by mouth 3 (three) times daily as needed for muscle spasms. (Patient not taking: Reported on 08/04/2014) 90 tablet 0  . gabapentin (NEURONTIN) 300 MG capsule Take 1 capsule (300 mg total) by mouth 3  (three) times daily. (Patient not taking: Reported on 10/07/2014) 90 capsule 2  . ibuprofen (ADVIL,MOTRIN) 600 MG tablet Take 1 tablet (600 mg total) by mouth every 8 (eight) hours as needed. (Patient not taking: Reported on 08/04/2014) 60 tablet 0  . traMADol (ULTRAM) 50 MG tablet Take 1 tablet (50 mg total) by mouth every 6 (six) hours as needed. (Patient not taking: Reported on 08/04/2014) 60 tablet 0   No current facility-administered medications on file prior to visit.   Family History  Problem Relation Age of Onset  . Diabetes Father   . Hypertension Father    Social History   Social History  . Marital Status: Married    Spouse Name: N/A  . Number of Children: N/A  . Years of Education: N/A   Occupational History  . Not on file.   Social History Main Topics  . Smoking status: Never Smoker   . Smokeless tobacco: Never Used  . Alcohol Use: No     Comment: Pt states he used to drink but quit approximately 2 months ago.   . Drug Use: No  . Sexual Activity: Not on file   Other Topics Concern  . Not on file   Social History Narrative    Review of Systems: Constitutional: Negative for fever, chills, diaphoresis, activity change, appetite change and fatigue. HENT: Negative for ear pain,  nosebleeds, congestion, facial swelling, rhinorrhea, neck pain, neck stiffness and ear discharge.  Eyes: Negative for pain, discharge, redness, itching and visual disturbance. Respiratory: Negative for cough, choking, chest tightness, shortness of breath, wheezing and stridor.  Cardiovascular: Negative for chest pain, palpitations and leg swelling. Gastrointestinal: Negative for abdominal distention. Genitourinary: Negative for dysuria, urgency, frequency, hematuria, flank pain, decreased urine volume, difficulty urinating and dyspareunia.  Musculoskeletal: Negative for back pain, joint swelling, arthralgia and gait problem. Neurological: Negative for dizziness, tremors, seizures, syncope,  facial asymmetry, speech difficulty, weakness, light-headedness, numbness and headaches.  Hematological: Negative for adenopathy. Does not bruise/bleed easily. Psychiatric/Behavioral: Negative for hallucinations, behavioral problems, confusion, dysphoric mood, decreased concentration and agitation.    Objective:   Filed Vitals:   08/14/15 1632  BP: 149/97  Pulse: 115  Temp: 98.7 F (37.1 C)  Resp: 18    Filed Weights   08/14/15 1632  Weight: 297 lb (134.718 kg)    BP Readings from Last 3 Encounters:  08/14/15 149/97  05/08/15 158/105  10/31/14 138/85    Physical Exam: Constitutional: Patient appears well-developed and well-nourished. No distress. AAOx3, obese male. HENT: Normocephalic, atraumatic, External right and left ear normal. Oropharynx is clear and moist.  Eyes: Conjunctivae and EOM are normal. PERRL, no scleral icterus. Neck: Normal ROM. Neck supple. No JVD.  CVS: RRR, S1/S2 +, no murmurs, no gallops, no carotid bruit.  Pulmonary: Effort and breath sounds normal, no stridor, rhonchi, wheezes, rales.  Abdominal: Soft. BS +, obese, no distension, tenderness, rebound or guarding.  Musculoskeletal: Normal range of motion. No edema and no tenderness.  LE: bilat/ no c/c/e, pulses 2+ bilateral. Neuro: Alert. muscle tone coordination. No cranial nerve deficit grossly. Skin: Skin is warm and dry. No rash noted. Not diaphoretic. No erythema. No pallor. Psychiatric: Normal mood and affect. Behavior, judgment, thought content normal.  Lab Results  Component Value Date   WBC 7.9 07/03/2014   HGB 13.6 07/03/2014   HCT 41.8 07/03/2014   MCV 85.0 07/03/2014   PLT 233 07/03/2014   Lab Results  Component Value Date   CREATININE 0.99 05/08/2015   BUN 9 05/08/2015   NA 140 05/08/2015   K 4.0 05/08/2015   CL 102 05/08/2015   CO2 32* 05/08/2015    Lab Results  Component Value Date   HGBA1C 6.10 05/08/2015   Lipid Panel     Component Value Date/Time   CHOL 149  01/07/2014 0921   TRIG 107 01/07/2014 0921   HDL 34* 01/07/2014 0921   CHOLHDL 4.4 01/07/2014 0921   VLDL 21 01/07/2014 0921   LDLCALC 94 01/07/2014 0921       Depression screen PHQ 2/9 08/14/2015 10/07/2014 04/26/2014  Decreased Interest 0 1 3  Down, Depressed, Hopeless 0 1 1  PHQ - 2 Score 0 2 4  Altered sleeping - 0 0  Tired, decreased energy - 0 1  Change in appetite - 0 0  Feeling bad or failure about yourself  - 1 1  Trouble concentrating - 0 0  Moving slowly or fidgety/restless - 0 0  Suicidal thoughts - 0 0  PHQ-9 Score - 3 6    Assessment and plan:   1. Essential hypertension Uncontrolled, stressed importance of low salt/dash/<2gm daily diet as well as increase exercise; goal 15mns /day as able. - amLODipine (NORVASC) 10 MG tablet; Take 1 tablet (10 mg total) by mouth daily.  Dispense: 90 tablet; Refill: 5 - losartan (COZAAR) 100 MG tablet; Take 1 tablet (100 mg  total) by mouth daily.  Dispense: 90 tablet; Refill: 5 - added hctz '25mg'$  po qday  2. Prediabetes - a1c 6.1 (05/08/15);  - Glucose (CBG) 107 today - continue metformin daily, low carb/ada diet discussed today as well  3. Sp bilateral hernia repairs 08/03/15 - defer to Gs.  4. Health maintenance - pt advised to call me when Medicaid/insurance kicks in to order colonoscopy.   Return in about 3 months (around 11/14/2015), or if symptoms worsen or fail to improve, for htn/dm.  The patient was given clear instructions to go to ER or return to medical center if symptoms don't improve, worsen or new problems develop. The patient verbalized understanding. The patient was told to call to get lab results if they haven't heard anything in the next week.      Maren Reamer, MD, [place], [place]   08/14/2015, 5:02 PM

## 2015-08-15 MED FILL — LOSARTAN POTASSIUM 100 MG T: 100 | 30 days supply | Qty: 30 | Fill #0

## 2015-08-15 MED FILL — HYDROCHLOROTHIAZIDE 25 MG T: 25 | 30 days supply | Qty: 30 | Fill #0

## 2015-08-15 MED FILL — AMLODIPINE BESYLATE 10 MG T: 10 | 30 days supply | Qty: 30 | Fill #0

## 2015-08-18 MED FILL — METFORMIN HCL ER 500 MG TAB: 500 | 30 days supply | Qty: 30 | Fill #5

## 2015-09-01 MED FILL — !VIAGRA 25 MG TABLET: 25 | 30 days supply | Qty: 5 | Fill #0

## 2015-09-13 ENCOUNTER — Other Ambulatory Visit: Payer: Self-pay | Admitting: *Deleted

## 2015-09-13 DIAGNOSIS — N529 Male erectile dysfunction, unspecified: Secondary | ICD-10-CM

## 2015-09-13 MED ORDER — SILDENAFIL CITRATE 25 MG PO TABS: 25.0000 mg | ORAL_TABLET | Freq: Every day | ORAL | Status: DC | PRN

## 2015-09-13 NOTE — Telephone Encounter (Signed)
PASS PROGRAM 

## 2015-09-15 MED FILL — LOSARTAN POTASSIUM 100 MG T: 100 | 30 days supply | Qty: 30 | Fill #1

## 2015-09-15 MED FILL — HYDROCHLOROTHIAZIDE 25 MG T: 25 | 30 days supply | Qty: 30 | Fill #1

## 2015-09-15 MED FILL — METFORMIN HCL ER 500 MG TAB: 500 | 30 days supply | Qty: 30 | Fill #6

## 2015-09-15 MED FILL — AMLODIPINE BESYLATE 10 MG T: 10 | 30 days supply | Qty: 30 | Fill #1

## 2015-11-06 MED FILL — LOSARTAN POTASSIUM 100 MG T: 100 | 30 days supply | Qty: 30 | Fill #2

## 2015-11-06 MED FILL — METFORMIN HCL ER 500 MG TAB: 500 | 30 days supply | Qty: 30 | Fill #2

## 2015-11-06 MED FILL — HYDROCHLOROTHIAZIDE 25 MG T: 25 | 30 days supply | Qty: 30 | Fill #2

## 2015-11-06 MED FILL — AMLODIPINE BESYLATE 10 MG T: 10 | 30 days supply | Qty: 30 | Fill #2

## 2016-02-01 MED FILL — AMLODIPINE BESYLATE 10 MG T: 10 | 30 days supply | Qty: 30 | Fill #3

## 2016-02-01 MED FILL — LOSARTAN POTASSIUM 100 MG T: 100 | 30 days supply | Qty: 30 | Fill #3

## 2016-02-01 MED FILL — HYDROCHLOROTHIAZIDE 25 MG T: 25 | 30 days supply | Qty: 30 | Fill #3

## 2016-02-01 MED FILL — METFORMIN HCL ER 500 MG TAB: 500 | 30 days supply | Qty: 30 | Fill #3

## 2016-04-18 MED FILL — METFORMIN HCL ER 500 MG TAB: 500 | 30 days supply | Qty: 30 | Fill #4

## 2016-05-27 ENCOUNTER — Encounter (HOSPITAL_COMMUNITY): Payer: Self-pay | Admitting: Emergency Medicine

## 2016-05-27 ENCOUNTER — Emergency Department (HOSPITAL_COMMUNITY): Payer: Medicaid Other

## 2016-05-27 ENCOUNTER — Emergency Department (HOSPITAL_COMMUNITY)
Admission: EM | Admit: 2016-05-27 | Discharge: 2016-05-27 | Disposition: A | Discharge status: Home / Self Care | Payer: Medicaid Other | Attending: Emergency Medicine | Admitting: Emergency Medicine

## 2016-05-27 DIAGNOSIS — I10 Essential (primary) hypertension: Secondary | ICD-10-CM | POA: Insufficient documentation

## 2016-05-27 DIAGNOSIS — J189 Pneumonia, unspecified organism: Secondary | ICD-10-CM

## 2016-05-27 DIAGNOSIS — E119 Type 2 diabetes mellitus without complications: Secondary | ICD-10-CM | POA: Insufficient documentation

## 2016-05-27 DIAGNOSIS — Z7984 Long term (current) use of oral hypoglycemic drugs: Secondary | ICD-10-CM | POA: Insufficient documentation

## 2016-05-27 LAB — I-STAT TROPONIN, ED: TROPONIN I, POC: 0.01 ng/mL (ref 0.00–0.08)

## 2016-05-27 LAB — CBC
HCT: 42.3 % (ref 39.0–52.0)
HEMOGLOBIN: 14.2 g/dL (ref 13.0–17.0)
MCH: 28.2 pg (ref 26.0–34.0)
MCHC: 33.6 g/dL (ref 30.0–36.0)
MCV: 83.9 fL (ref 78.0–100.0)
PLATELETS: 223 10*3/uL (ref 150–400)
RBC: 5.04 MIL/uL (ref 4.22–5.81)
RDW: 15.3 % (ref 11.5–15.5)
WBC: 8.9 10*3/uL (ref 4.0–10.5)

## 2016-05-27 LAB — BASIC METABOLIC PANEL
ANION GAP: 5 (ref 5–15)
BUN: 13 mg/dL (ref 6–20)
CALCIUM: 8.5 mg/dL — AB (ref 8.9–10.3)
CO2: 26 mmol/L (ref 22–32)
Chloride: 106 mmol/L (ref 101–111)
Creatinine, Ser: 1.2 mg/dL (ref 0.61–1.24)
Glucose, Bld: 104 mg/dL — ABNORMAL HIGH (ref 65–99)
Potassium: 4.5 mmol/L (ref 3.5–5.1)
SODIUM: 137 mmol/L (ref 135–145)

## 2016-05-27 MED ORDER — ALBUTEROL SULFATE HFA 108 (90 BASE) MCG/ACT IN AERS
2.0000 | INHALATION_SPRAY | RESPIRATORY_TRACT | Status: DC | PRN
  Administered 2016-05-27: 2 via RESPIRATORY_TRACT
  Filled 2016-05-27: qty 6.7

## 2016-05-27 MED ORDER — ALBUTEROL SULFATE (2.5 MG/3ML) 0.083% IN NEBU
5.0000 mg | INHALATION_SOLUTION | Freq: Once | RESPIRATORY_TRACT | Status: AC
  Administered 2016-05-27: 5 mg via RESPIRATORY_TRACT
  Filled 2016-05-27: qty 6

## 2016-05-27 MED ORDER — IOPAMIDOL (ISOVUE-370) INJECTION 76%
INTRAVENOUS | Status: AC
  Filled 2016-05-27: qty 100

## 2016-05-27 MED ORDER — AZITHROMYCIN 250 MG PO TABS: 250.0000 mg | ORAL_TABLET | Freq: Every day | ORAL | 0 refills | Status: DC

## 2016-05-27 MED ORDER — IOPAMIDOL (ISOVUE-370) INJECTION 76%
100.0000 mL | Freq: Once | INTRAVENOUS | Status: AC | PRN
  Administered 2016-05-27: 100 mL via INTRAVENOUS

## 2016-05-27 MED ORDER — BENZONATATE 100 MG PO CAPS: 100.0000 mg | ORAL_CAPSULE | Freq: Three times a day (TID) | ORAL | 0 refills | Status: DC

## 2016-05-27 NOTE — ED Notes (Signed)
Discharge instructions, follow up care, and rx x2 reviewed with patient. Patient verbalized understanding. 

## 2016-05-27 NOTE — ED Triage Notes (Signed)
Patient c/o productive cough with congestion since this morning with chest tightness while coughing. Denies SOB, N/V/D and abdominal pain. Ambulatory to triage.

## 2016-05-27 NOTE — ED Provider Notes (Signed)
[facility] DEPT Provider Note   CSN: [id number] Arrival date & time: 05/27/16  1212     History   Chief Complaint Chief Complaint  Patient presents with  . Cough    HPI Seth Weaver is a 56 y.o. male.  Pt with hx of HTN and DM presents with cough and SOB.  States that this am noticed some mild rhinorrhea and dry cough which quickly progressed to a severe coughing spell and tightness to center of chest.  Has mild ongoing pain to center of chest.  Cough is productive now of clear sputum.  No fevers.  Mild ongoing SOB.  No fevers.  No myalgias.  No leg pain or swelling.  No hx of asthma or other lung issues.      Past Medical History:  Diagnosis Date  . Diabetes mellitus without complication ([place])   . Hypertension     Patient Active Problem List   Diagnosis Date Noted  . Inguinal hernia 10/07/2014  . Essential hypertension 02/22/2014  . DM type 2 (diabetes mellitus, type 2) ([place]) 02/22/2014    History reviewed. No pertinent surgical history.     Home Medications    Prior to Admission medications   Medication Sig Start Date End Date Taking? Authorizing Provider  amLODipine (NORVASC) 10 MG tablet Take 1 tablet (10 mg total) by mouth daily. 08/14/15  Yes Maren Reamer, MD  Blood Glucose Monitoring Suppl (BLOOD GLUCOSE METER KIT AND SUPPLIES) Dispense based on patient and insurance preference. Test blood sugar once daily as directed. (FOR ICD-9 250.00, 250.01). 01/18/14  Yes Lance Bosch, NP  losartan (COZAAR) 100 MG tablet Take 1 tablet (100 mg total) by mouth daily. 08/14/15  Yes Maren Reamer, MD  metFORMIN (GLUCOPHAGE XR) 500 MG 24 hr tablet Take 1 tablet (500 mg total) by mouth daily with breakfast. 05/08/15  Yes Lance Bosch, NP  azithromycin (ZITHROMAX) 250 MG tablet Take 1 tablet (250 mg total) by mouth daily. Take first 2 tablets together, then 1 every day until finished. 05/27/16   Malvin Johns, MD  benzonatate (TESSALON) 100 MG capsule Take 1 capsule  (100 mg total) by mouth every 8 (eight) hours. 05/27/16   Malvin Johns, MD  gabapentin (NEURONTIN) 300 MG capsule Take 1 capsule (300 mg total) by mouth 3 (three) times daily. Patient not taking: Reported on 10/07/2014 05/24/14   Lance Bosch, NP  hydrochlorothiazide (HYDRODIURIL) 25 MG tablet Take 1 tablet (25 mg total) by mouth daily. Patient not taking: Reported on 05/27/2016 08/14/15   Maren Reamer, MD  sildenafil (VIAGRA) 25 MG tablet Take 1 tablet (25 mg total) by mouth daily as needed for erectile dysfunction. Patient not taking: Reported on 05/27/2016 09/13/15   Tresa Garter, MD    Family History Family History  Problem Relation Age of Onset  . Diabetes Father   . Hypertension Father     Social History Social History  Substance Use Topics  . Smoking status: Never Smoker  . Smokeless tobacco: Never Used  . Alcohol use No     Comment: Pt states he used to drink but quit approximately 2 months ago.      Allergies   Patient has no known allergies.   Review of Systems Review of Systems  Constitutional: Negative for chills, diaphoresis, fatigue and fever.  HENT: Positive for rhinorrhea. Negative for congestion and sneezing.   Eyes: Negative.   Respiratory: Positive for cough, chest tightness and shortness of breath.   Cardiovascular:  Positive for chest pain. Negative for leg swelling.  Gastrointestinal: Negative for abdominal pain, blood in stool, diarrhea, nausea and vomiting.  Genitourinary: Negative for difficulty urinating, flank pain, frequency and hematuria.  Musculoskeletal: Negative for arthralgias and back pain.  Skin: Negative for rash.  Neurological: Negative for dizziness, speech difficulty, weakness, numbness and headaches.     Physical Exam Updated Vital Signs BP 148/84   Pulse 107   Temp 98.6 F (37 C) (Oral)   Resp 14   SpO2 96%   Physical Exam  Constitutional: He is oriented to person, place, and time. He appears well-developed and  well-nourished.  HENT:  Head: Normocephalic and atraumatic.  Eyes: Pupils are equal, round, and reactive to light.  Neck: Normal range of motion. Neck supple.  Cardiovascular: Regular rhythm and normal heart sounds.  Tachycardia present.   Pulmonary/Chest: Effort normal. No respiratory distress. He has wheezes (scarce wheezing). He has no rales. He exhibits no tenderness.  Abdominal: Soft. Bowel sounds are normal. There is no tenderness. There is no rebound and no guarding.  Musculoskeletal: Normal range of motion. He exhibits no edema.  No edema or calf tenderness  Lymphadenopathy:    He has no cervical adenopathy.  Neurological: He is alert and oriented to person, place, and time.  Skin: Skin is warm and dry. No rash noted.  Psychiatric: He has a normal mood and affect.     ED Treatments / Results  Labs (all labs ordered are listed, but only abnormal results are displayed) Labs Reviewed  BASIC METABOLIC PANEL - Abnormal; Notable for the following:       Result Value   Glucose, Bld 104 (*)    Calcium 8.5 (*)    All other components within normal limits  CBC  I-STAT TROPOININ, ED    EKG  EKG Interpretation  Date/Time:  Monday May 27 2016 12:35:30 EST Ventricular Rate:  130 PR Interval:    QRS Duration: 87 QT Interval:  315 QTC Calculation: 464 R Axis:   49 Text Interpretation:  Sinus tachycardia Anteroseptal infarct, old Baseline wander in lead(s) V5 SINCE LAST TRACING HEART RATE HAS INCREASED Confirmed by Joyelle Siedlecki  MD, Pericles Carmicheal ([id number]) on 05/27/2016 2:48:52 PM       Radiology Dg Chest 2 View  Result Date: 05/27/2016 CLINICAL DATA:  Productive cough EXAM: CHEST  2 VIEW COMPARISON:  07/03/2014 FINDINGS: Heart size mildly enlarged. Negative for heart failure. Lungs are clear without infiltrate effusion or mass. No acute skeletal abnormality. IMPRESSION: No active cardiopulmonary disease. Electronically Signed   By: Franchot Gallo M.D.   On: 05/27/2016 12:47   Ct Angio  Chest Pe W/cm &/or Wo Cm  Result Date: 05/27/2016 CLINICAL DATA:  56 year old male with mid chest pain and wheezing since 09/30 this morning EXAM: CT ANGIOGRAPHY CHEST WITH CONTRAST TECHNIQUE: Multidetector CT imaging of the chest was performed using the standard protocol during bolus administration of intravenous contrast. Multiplanar CT image reconstructions and MIPs were obtained to evaluate the vascular anatomy. CONTRAST:  100 mL Isovue 370 COMPARISON:  Prior chest x-ray 05/27/2016; prior CT scan of the chest 07/03/2014 FINDINGS: Cardiovascular: Satisfactory opacification of the pulmonary arteries to the segmental level. No evidence of pulmonary embolism. Normal heart size. No pericardial effusion. Two vessel aortic arch anatomy. The right brachiocephalic and left common carotid arteries share a common origin. Mediastinum/Nodes: Unremarkable CT appearance of the thyroid gland. No suspicious mediastinal or hilar adenopathy. No soft tissue mediastinal mass. The thoracic esophagus is unremarkable. Lungs/Pleura: Subtle patchy  ground glass attenuation airspace opacities within the central aspect of the right upper, middle and lower lobes. The left lung is relatively spared. No suspicious pulmonary nodule or mass. No pleural effusion or pneumothorax. Upper Abdomen: No acute abnormality. Musculoskeletal: No chest wall abnormality. No acute or significant osseous findings. Review of the MIP images confirms the above findings. IMPRESSION: 1. Negative for acute pulmonary embolus. 2. Subtle patchy ground-glass attenuation airspace opacities in a central peribronchovascular distribution affecting the right upper, middle and lower lobes with sparing of the left lung. Differential considerations are most consistent with an acute infectious or inflammatory process such as multifocal or atypical pneumonia. Electronically Signed   By: Jacqulynn Cadet M.D.   On: 05/27/2016 16:09    Procedures Procedures (including critical  care time)  Medications Ordered in ED Medications  iopamidol (ISOVUE-370) 76 % injection (not administered)  albuterol (PROVENTIL HFA;VENTOLIN HFA) 108 (90 Base) MCG/ACT inhaler 2 puff (not administered)  albuterol (PROVENTIL) (2.5 MG/3ML) 0.083% nebulizer solution 5 mg (5 mg Nebulization Given 05/27/16 1600)  iopamidol (ISOVUE-370) 76 % injection 100 mL (100 mLs Intravenous Contrast Given 05/27/16 1547)     Initial Impression / Assessment and Plan / ED Course  I have reviewed the triage vital signs and the nursing notes.  Pertinent labs & imaging results that were available during my care of the patient were reviewed by me and considered in my medical decision making (see chart for details).     13:30 pt with sudden coughing spell with post-tussive emesis and SOB.  Has ongoing tachycardia to 120 when pt was sitting up on my exam.  No hypoxia.  Will check Chest CT  17:00 no PE or PTX seen.  +probable pneumonia.  Will tx with zithromax, tessalon perles.  Pt feels better after nebulizer.  No hypoxia or increased WOB.  Has mild tachypnea.  Was given albuterol MDI.  Return precautions given.  Advised to f/u with PCP.  Final Clinical Impressions(s) / ED Diagnoses   Final diagnoses:  Community acquired pneumonia, unspecified laterality    New Prescriptions New Prescriptions   AZITHROMYCIN (ZITHROMAX) 250 MG TABLET    Take 1 tablet (250 mg total) by mouth daily. Take first 2 tablets together, then 1 every day until finished.   BENZONATATE (TESSALON) 100 MG CAPSULE    Take 1 capsule (100 mg total) by mouth every 8 (eight) hours.     Malvin Johns, MD 05/27/16 [phone]

## 2016-05-27 NOTE — ED Notes (Signed)
ED Provider at bedside. 

## 2016-05-27 NOTE — ED Notes (Signed)
Patient transported to MRI 

## 2016-06-06 ENCOUNTER — Other Ambulatory Visit: Payer: Self-pay | Admitting: Internal Medicine

## 2016-06-06 DIAGNOSIS — E119 Type 2 diabetes mellitus without complications: Secondary | ICD-10-CM

## 2016-06-27 ENCOUNTER — Telehealth: Payer: Self-pay

## 2016-06-27 NOTE — Telephone Encounter (Signed)
Called to find out if pt wants colonoscopy scheduled

## 2016-07-01 ENCOUNTER — Encounter: Payer: Self-pay | Admitting: Internal Medicine

## 2016-07-01 ENCOUNTER — Ambulatory Visit: Payer: Self-pay | Attending: Internal Medicine | Admitting: Internal Medicine

## 2016-07-01 VITALS — BP 154/118 | HR 105 | Temp 98.8°F | Resp 16

## 2016-07-01 DIAGNOSIS — Z125 Encounter for screening for malignant neoplasm of prostate: Secondary | ICD-10-CM

## 2016-07-01 DIAGNOSIS — E119 Type 2 diabetes mellitus without complications: Secondary | ICD-10-CM

## 2016-07-01 DIAGNOSIS — I10 Essential (primary) hypertension: Secondary | ICD-10-CM

## 2016-07-01 DIAGNOSIS — Z794 Long term (current) use of insulin: Secondary | ICD-10-CM | POA: Insufficient documentation

## 2016-07-01 DIAGNOSIS — Z1329 Encounter for screening for other suspected endocrine disorder: Secondary | ICD-10-CM

## 2016-07-01 DIAGNOSIS — Z114 Encounter for screening for human immunodeficiency virus [HIV]: Secondary | ICD-10-CM

## 2016-07-01 DIAGNOSIS — Z1159 Encounter for screening for other viral diseases: Secondary | ICD-10-CM

## 2016-07-01 DIAGNOSIS — N529 Male erectile dysfunction, unspecified: Secondary | ICD-10-CM

## 2016-07-01 DIAGNOSIS — E118 Type 2 diabetes mellitus with unspecified complications: Secondary | ICD-10-CM

## 2016-07-01 DIAGNOSIS — Z79899 Other long term (current) drug therapy: Secondary | ICD-10-CM | POA: Insufficient documentation

## 2016-07-01 DIAGNOSIS — Z23 Encounter for immunization: Secondary | ICD-10-CM

## 2016-07-01 LAB — POCT GLYCOSYLATED HEMOGLOBIN (HGB A1C): HEMOGLOBIN A1C: 6.1

## 2016-07-01 LAB — GLUCOSE, POCT (MANUAL RESULT ENTRY): POC Glucose: 98 mg/dl (ref 70–99)

## 2016-07-01 MED ORDER — HYDROCHLOROTHIAZIDE 25 MG PO TABS: 25.0000 mg | ORAL_TABLET | Freq: Every day | ORAL | 3 refills | Status: DC

## 2016-07-01 MED ORDER — LOSARTAN POTASSIUM 100 MG PO TABS: 100.0000 mg | ORAL_TABLET | Freq: Every day | ORAL | 5 refills | Status: DC

## 2016-07-01 MED ORDER — SILDENAFIL CITRATE 25 MG PO TABS: 25.0000 mg | ORAL_TABLET | Freq: Every day | ORAL | 3 refills | Status: DC | PRN

## 2016-07-01 MED ORDER — METFORMIN HCL ER 500 MG PO TB24: 500.0000 mg | ORAL_TABLET | Freq: Every day | ORAL | 3 refills | Status: DC

## 2016-07-01 MED ORDER — AMLODIPINE BESYLATE 10 MG PO TABS: 10.0000 mg | ORAL_TABLET | Freq: Every day | ORAL | 5 refills | Status: DC

## 2016-07-01 MED FILL — METFORMIN HCL ER 500 MG TAB: 500 | 30 days supply | Qty: 30 | Fill #0

## 2016-07-01 MED FILL — AMLODIPINE BESYLATE 10 MG T: 10 | 30 days supply | Qty: 30 | Fill #0

## 2016-07-01 MED FILL — LOSARTAN POTASSIUM 100 MG T: 100 | 30 days supply | Qty: 30 | Fill #0

## 2016-07-01 MED FILL — HYDROCHLOROTHIAZIDE 25 MG T: 25 | 30 days supply | Qty: 30 | Fill #0

## 2016-07-01 NOTE — Progress Notes (Signed)
Seth Weaver, is a 56 y.o. male  ZOX:[id number]  [id number]:[id number]  DOB - 1960-11-05  Chief Complaint  Patient presents with  . Diabetes  . Hypertension        Subjective:   Seth Weaver is a 56 y.o. male here today for a follow up visit for htn. Last seen 08/14/15. Was seen in ED 05/27/16 for presumed pna, and treated w/ zpack. Pt states his sxs have resolved. He states he ran out of all his meds about 3 wks ago.   Patient has No headache, No chest pain, No abdominal pain - No Nausea, No new weakness tingling or numbness, No Cough - SOB.  Denies tob, rare etoh.  No problems updated.  ALLERGIES: No Known Allergies  PAST MEDICAL HISTORY: Past Medical History:  Diagnosis Date  . Diabetes mellitus without complication ([place])   . Hypertension     MEDICATIONS AT HOME: Prior to Admission medications   Medication Sig Start Date End Date Taking? Authorizing Provider  amLODipine (NORVASC) 10 MG tablet Take 1 tablet (10 mg total) by mouth daily. 07/01/16   Maren Reamer, MD  azithromycin (ZITHROMAX) 250 MG tablet Take 1 tablet (250 mg total) by mouth daily. Take first 2 tablets together, then 1 every day until finished. Patient not taking: Reported on 07/01/2016 05/27/16   Malvin Johns, MD  benzonatate (TESSALON) 100 MG capsule Take 1 capsule (100 mg total) by mouth every 8 (eight) hours. 05/27/16   Malvin Johns, MD  Blood Glucose Monitoring Suppl (BLOOD GLUCOSE METER KIT AND SUPPLIES) Dispense based on patient and insurance preference. Test blood sugar once daily as directed. (FOR ICD-9 250.00, 250.01). 01/18/14   Lance Bosch, NP  gabapentin (NEURONTIN) 300 MG capsule Take 1 capsule (300 mg total) by mouth 3 (three) times daily. Patient not taking: Reported on 10/07/2014 05/24/14   Lance Bosch, NP  hydrochlorothiazide (HYDRODIURIL) 25 MG tablet Take 1 tablet (25 mg total) by mouth daily. 07/01/16   Maren Reamer, MD  losartan (COZAAR) 100 MG tablet Take 1 tablet (100 mg total) by  mouth daily. 07/01/16   Maren Reamer, MD  metFORMIN (GLUCOPHAGE-XR) 500 MG 24 hr tablet Take 1 tablet (500 mg total) by mouth daily with breakfast. 07/01/16   Maren Reamer, MD  sildenafil (VIAGRA) 25 MG tablet Take 1 tablet (25 mg total) by mouth daily as needed for erectile dysfunction. 07/01/16   Maren Reamer, MD     Objective:   Vitals:   07/01/16 0953  BP: (!) 154/118  Pulse: (!) 105  Resp: 16  Temp: 98.8 F (37.1 C)  TempSrc: Oral  SpO2: 97%    Exam General appearance : Awake, alert, not in any distress. Speech Clear. Not toxic looking HEENT: Atraumatic and Normocephalic, pupils equally reactive to light. Neck: supple, no JVD. No cervical lymphadenopathy.  Chest:Good air entry bilaterally, no added sounds. CVS: S1 S2 regular, no murmurs/gallups or rubs. Abdomen: Bowel sounds active, obese, Non tender and not distended with no gaurding, rigidity or rebound. Foot exam: bilateral peripheral pulses 2+ (dorsalis pedis and post tibialis pulses), no ulcers noted/no ecchymosis, warm to touch, monofilament testing 3/3 bilat. Sensation intact.  No c/c/e. Neurology: Awake alert, and oriented X 3, CN II-XII grossly intact, Non focal Skin:No Rash  Data Review Lab Results  Component Value Date   HGBA1C 6.1 07/01/2016   HGBA1C 6.10 05/08/2015   HGBA1C 6.20 08/04/2014    Depression screen [facility] 2/9 07/01/2016 08/14/2015 10/07/2014 04/26/2014  Decreased Interest 0 0 1 3  Down, Depressed, Hopeless 0 0 1 1  PHQ - 2 Score 0 0 2 4  Altered sleeping - - 0 0  Tired, decreased energy - - 0 1  Change in appetite - - 0 0  Feeling bad or failure about yourself  - - 1 1  Trouble concentrating - - 0 0  Moving slowly or fidgety/restless - - 0 0  Suicidal thoughts - - 0 0  PHQ-9 Score - - 3 6      Assessment & Plan   1. Type 2 diabetes mellitus with complication, unspecified long term insulin use status (HCC) - well controlled, proper foot care discussed  - POCT glucose (manual  entry) - POCT glycosylated hemoglobin (Hb A1C) 6.1 - Microalbumin/Creatinine Ratio, Urine - metFORMIN (GLUCOPHAGE-XR) 500 MG 24 hr tablet; Take 1 tablet (500 mg total) by mouth daily with breakfast.  Dispense: 90 tablet; Refill: 3 - Lipid Panel - recd eye exam, out of pocket cost as well. - pneumococcal 23v today.  2. Essential hypertension Renewed all meds, advised to take - low salt diet advised - amLODipine (NORVASC) 10 MG tablet; Take 1 tablet (10 mg total) by mouth daily.  Dispense: 90 tablet; Refill: 5 - losartan (COZAAR) 100 MG tablet; Take 1 tablet (100 mg total) by mouth daily.  Dispense: 90 tablet; Refill: 5 - hctz 25 qd - recheck  w/ Carilyn Goodpasture RN 2 wks - if sbp <130 - change cozaar and hctz to hyzaar 100-25 qd.   If sbp >130, change cozaar and hctz to Hyzaar 100-25 qd,  than ADD hydralazine 10tid and fu w/ me in 2 wks.  Continue amlodipine 10 qd. thanks  4. Prostate cancer screening - PSA  5. Thyroid disorder screen - TSH  6. Encounter for screening for HIV - HIV antibody (with reflex)  7. Need for hepatitis C screening test - Hepatitis C antibody  8. Erectile dysfunction, unspecified erectile dysfunction type - sildenafil (VIAGRA) 25 MG tablet; Take 1 tablet (25 mg total) by mouth daily as needed for erectile dysfunction.  Dispense: 30 tablet; Refill: 3     Patient have been counseled extensively about nutrition and exercise  Return in about 3 months (around 09/30/2016), or if symptoms worsen or fail to improve.  The patient was given clear instructions to go to ER or return to medical center if symptoms don't improve, worsen or new problems develop. The patient verbalized understanding. The patient was told to call to get lab results if they haven't heard anything in the next week.   This note has been created with Surveyor, quantity. Any transcriptional errors are unintentional.   Maren Reamer, MD, [place] [facility] [place], [place]   07/01/2016, 10:19 AM

## 2016-07-01 NOTE — Patient Instructions (Addendum)
Carilyn Goodpasture RN - bp check 2 wks. - financial aid/  -   Low-Sodium Eating Plan Sodium, which is an element that makes up salt, helps you maintain a healthy balance of fluids in your body. Too much sodium can increase your blood pressure and cause fluid and waste to be held in your body. Your health care provider or dietitian may recommend following this plan if you have high blood pressure (hypertension), kidney disease, liver disease, or heart failure. Eating less sodium can help lower your blood pressure, reduce swelling, and protect your heart, liver, and kidneys. What are tips for following this plan? General guidelines   Most people on this plan should limit their sodium intake to 1,500-2,000 mg (milligrams) of sodium each day. Reading food labels   The Nutrition Facts label lists the amount of sodium in one serving of the food. If you eat more than one serving, you must multiply the listed amount of sodium by the number of servings.  Choose foods with less than 140 mg of sodium per serving.  Avoid foods with 300 mg of sodium or more per serving. Shopping   Look for lower-sodium products, often labeled as "low-sodium" or "no salt added."  Always check the sodium content even if foods are labeled as "unsalted" or "no salt added".  Buy fresh foods.  Avoid canned foods and premade or frozen meals.  Avoid canned, cured, or processed meats  Buy breads that have less than 80 mg of sodium per slice. Cooking   Eat more home-cooked food and less restaurant, buffet, and fast food.  Avoid adding salt when cooking. Use salt-free seasonings or herbs instead of table salt or sea salt. Check with your health care provider or pharmacist before using salt substitutes.  Cook with plant-based oils, such as canola, sunflower, or olive oil. Meal planning   When eating at a restaurant, ask that your food be prepared with less salt or no salt, if possible.  Avoid foods that contain MSG  (monosodium glutamate). MSG is sometimes added to [place] food, bouillon, and some canned foods. What foods are recommended? The items listed may not be a complete list. Talk with your dietitian about what dietary choices are best for you. Grains  Low-sodium cereals, including oats, puffed wheat and rice, and shredded wheat. Low-sodium crackers. Unsalted rice. Unsalted pasta. Low-sodium bread. Whole-grain breads and whole-grain pasta. Vegetables  Fresh or frozen vegetables. "No salt added" canned vegetables. "No salt added" tomato sauce and paste. Low-sodium or reduced-sodium tomato and vegetable juice. Fruits  Fresh, frozen, or canned fruit. Fruit juice. Meats and other protein foods  Fresh or frozen (no salt added) meat, poultry, seafood, and fish. Low-sodium canned tuna and salmon. Unsalted nuts. Dried peas, beans, and lentils without added salt. Unsalted canned beans. Eggs. Unsalted nut butters. Dairy  Milk. Soy milk. Cheese that is naturally low in sodium, such as ricotta cheese, fresh mozzarella, or Swiss cheese Low-sodium or reduced-sodium cheese. Cream cheese. Yogurt. Fats and oils  Unsalted butter. Unsalted margarine with no trans fat. Vegetable oils such as canola or olive oils. Seasonings and other foods  Fresh and dried herbs and spices. Salt-free seasonings. Low-sodium mustard and ketchup. Sodium-free salad dressing. Sodium-free light mayonnaise. Fresh or refrigerated horseradish. Lemon juice. Vinegar. Homemade, reduced-sodium, or low-sodium soups. Unsalted popcorn and pretzels. Low-salt or salt-free chips. What foods are not recommended? The items listed may not be a complete list. Talk with your dietitian about what dietary choices are best for you. Grains  Instant hot cereals. Bread stuffing, pancake, and biscuit mixes. Croutons. Seasoned rice or pasta mixes. Noodle soup cups. Boxed or frozen macaroni and cheese. Regular salted crackers. Self-rising flour. Vegetables   Sauerkraut, pickled vegetables, and relishes. Olives. [place] fries. Onion rings. Regular canned vegetables (not low-sodium or reduced-sodium). Regular canned tomato sauce and paste (not low-sodium or reduced-sodium). Regular tomato and vegetable juice (not low-sodium or reduced-sodium). Frozen vegetables in sauces. Meats and other protein foods  Meat or fish that is salted, canned, smoked, spiced, or pickled. Bacon, ham, sausage, hotdogs, corned beef, chipped beef, packaged lunch meats, salt pork, jerky, pickled herring, anchovies, regular canned tuna, sardines, salted nuts. Dairy  Processed cheese and cheese spreads. Cheese curds. Blue cheese. Feta cheese. String cheese. Regular cottage cheese. Buttermilk. Canned milk. Fats and oils  Salted butter. Regular margarine. Ghee. Bacon fat. Seasonings and other foods  Onion salt, garlic salt, seasoned salt, table salt, and sea salt. Canned and packaged gravies. Worcestershire sauce. Tartar sauce. Barbecue sauce. Teriyaki sauce. Soy sauce, including reduced-sodium. Steak sauce. Fish sauce. Oyster sauce. Cocktail sauce. Horseradish that you find on the shelf. Regular ketchup and mustard. Meat flavorings and tenderizers. Bouillon cubes. Hot sauce and Tabasco sauce. Premade or packaged marinades. Premade or packaged taco seasonings. Relishes. Regular salad dressings. Salsa. Potato and tortilla chips. Corn chips and puffs. Salted popcorn and pretzels. Canned or dried soups. Pizza. Frozen entrees and pot pies. Summary  Eating less sodium can help lower your blood pressure, reduce swelling, and protect your heart, liver, and kidneys.  Most people on this plan should limit their sodium intake to 1,500-2,000 mg (milligrams) of sodium each day.  Canned, boxed, and frozen foods are high in sodium. Restaurant foods, fast foods, and pizza are also very high in sodium. You also get sodium by adding salt to food.  Try to cook at home, eat more fresh fruits and  vegetables, and eat less fast food, canned, processed, or prepared foods. This information is not intended to replace advice given to you by your health care provider. Make sure you discuss any questions you have with your health care provider. Document Released: 08/31/2001 Document Revised: 03/04/2016 Document Reviewed: 03/04/2016 Elsevier Interactive Patient Education  2017 [employer] Inc[employer] -   Prediabetes Prediabetes is the condition of having a blood sugar (blood glucose) level that is higher than it should be, but not high enough for you to be diagnosed with type 2 diabetes. Having prediabetes puts you at risk for developing type 2 diabetes (type 2 diabetes mellitus). Prediabetes may be called impaired glucose tolerance or impaired fasting glucose. Prediabetes usually does not cause symptoms. Your health care provider can diagnose this condition with blood tests. You may be tested for prediabetes if you are overweight and if you have at least one other risk factor for prediabetes. Risk factors for prediabetes include:  Having a family member with type 2 diabetes.  Being overweight or obese.  Being older than age 80.  Being of American-Indian, African-American, Hispanic/Latino, or Asian/Pacific Islander descent.  Having an inactive (sedentary) lifestyle.  Having a history of gestational diabetes or polycystic ovarian syndrome (PCOS).  Having low levels of good cholesterol (HDL-C) or high levels of blood fats (triglycerides).  Having high blood pressure. What is blood glucose and how is blood glucose measured?   Blood glucose refers to the amount of glucose in your bloodstream. Glucose comes from eating foods that contain sugars and starches (carbohydrates) that the body breaks down into glucose. Your blood glucose level  may be measured in mg/dL (milligrams per deciliter) or mmol/L (millimoles per liter).Your blood glucose may be checked with one or more of the following blood  tests:  A fasting blood glucose (FBG) test. You will not be allowed to eat (you will fast) for at least 8 hours before a blood sample is taken.  A normal range for FBG is 70-100 mg/dl (3.9-5.6 mmol/L).  An A1c (hemoglobin A1c) blood test. This test provides information about blood glucose control over the previous 2?74months.  An oral glucose tolerance test (OGTT). This test measures your blood glucose twice:  After fasting. This is your baseline level.  Two hours after you drink a beverage that contains glucose. You may be diagnosed with prediabetes:  If your FBG is 100?125 mg/dL (5.6-6.9 mmol/L).  If your A1c level is 5.7?6.4%.  If your OGGT result is 140?199 mg/dL (7.8-11 mmol/L). These blood tests may be repeated to confirm your diagnosis. What happens if blood glucose is too high? The pancreas produces a hormone (insulin) that helps move glucose from the bloodstream into cells. When cells in the body do not respond properly to insulin that the body makes (insulin resistance), excess glucose builds up in the blood instead of going into cells. As a result, high blood glucose (hyperglycemia) can develop, which can cause many complications. This is a symptom of prediabetes. What can happen if blood glucose stays higher than normal for a long time? Having high blood glucose for a long time is dangerous. Too much glucose in your blood can damage your nerves and blood vessels. Long-term damage can lead to complications from diabetes, which may include:  Heart disease.  Stroke.  Blindness.  Kidney disease.  Depression.  Poor circulation in the feet and legs, which could lead to surgical removal (amputation) in severe cases. How can prediabetes be prevented from turning into type 2 diabetes?   To help prevent type 2 diabetes, take the following actions:  Be physically active.  Do moderate-intensity physical activity for at least 30 minutes on at least 5 days of the week, or as  much as told by your health care provider. This could be brisk walking, biking, or water aerobics.  Ask your health care provider what activities are safe for you. A mix of physical activities may be best, such as walking, swimming, cycling, and strength training.  Lose weight as told by your health care provider.  Losing 5-7% of your body weight can reverse insulin resistance.  Your health care provider can determine how much weight loss is best for you and can help you lose weight safely.  Follow a healthy meal plan. This includes eating lean proteins, complex carbohydrates, fresh fruits and vegetables, low-fat dairy products, and healthy fats.  Follow instructions from your health care provider about eating or drinking restrictions.  Make an appointment to see a diet and nutrition specialist (registered dietitian) to help you create a healthy eating plan that is right for you.  Do not smoke or use any tobacco products, such as cigarettes, chewing tobacco, and e-cigarettes. If you need help quitting, ask your health care provider.  Take over-the-counter and prescription medicines as told by your health care provider. You may be prescribed medicines that help lower the risk of type 2 diabetes. This information is not intended to replace advice given to you by your health care provider. Make sure you discuss any questions you have with your health care provider. Document Released: 07/03/2015 Document Revised: 08/17/2015 Document Reviewed: 05/02/2015  Elsevier Interactive Patient Education  2017 [place].  -   Hypertension Hypertension is another name for high blood pressure. High blood pressure forces your heart to work harder to pump blood. This can cause problems over time. There are two numbers in a blood pressure reading. There is a top number (systolic) over a bottom number (diastolic). It is best to have a blood pressure below 120/80. Healthy choices can help lower your blood  pressure. You may need medicine to help lower your blood pressure if:  Your blood pressure cannot be lowered with healthy choices.  Your blood pressure is higher than 130/80. Follow these instructions at home: Eating and drinking   If directed, follow the DASH eating plan. This diet includes:  Filling half of your plate at each meal with fruits and vegetables.  Filling one quarter of your plate at each meal with whole grains. Whole grains include whole wheat pasta, brown rice, and whole grain bread.  Eating or drinking low-fat dairy products, such as skim milk or low-fat yogurt.  Filling one quarter of your plate at each meal with low-fat (lean) proteins. Low-fat proteins include fish, skinless chicken, eggs, beans, and tofu.  Avoiding fatty meat, cured and processed meat, or chicken with skin.  Avoiding premade or processed food.  Eat less than 1,500 mg of salt (sodium) a day.  Limit alcohol use to no more than 1 drink a day for nonpregnant women and 2 drinks a day for men. One drink equals 12 oz of beer, 5 oz of wine, or 1 oz of hard liquor. Lifestyle   Work with your doctor to stay at a healthy weight or to lose weight. Ask your doctor what the best weight is for you.  Get at least 30 minutes of exercise that causes your heart to beat faster (aerobic exercise) most days of the week. This may include walking, swimming, or biking.  Get at least 30 minutes of exercise that strengthens your muscles (resistance exercise) at least 3 days a week. This may include lifting weights or pilates.  Do not use any products that contain nicotine or tobacco. This includes cigarettes and e-cigarettes. If you need help quitting, ask your doctor.  Check your blood pressure at home as told by your doctor.  Keep all follow-up visits as told by your doctor. This is important. Medicines   Take over-the-counter and prescription medicines only as told by your doctor. Follow directions  carefully.  Do not skip doses of blood pressure medicine. The medicine does not work as well if you skip doses. Skipping doses also puts you at risk for problems.  Ask your doctor about side effects or reactions to medicines that you should watch for. Contact a doctor if:  You think you are having a reaction to the medicine you are taking.  You have headaches that keep coming back (recurring).  You feel dizzy.  You have swelling in your ankles.  You have trouble with your vision. Get help right away if:  You get a very bad headache.  You start to feel confused.  You feel weak or numb.  You feel faint.  You get very bad pain in your:  Chest.  Belly (abdomen).  You throw up (vomit) more than once.  You have trouble breathing. Summary  Hypertension is another name for high blood pressure.  Making healthy choices can help lower blood pressure. If your blood pressure cannot be controlled with healthy choices, you may need to take medicine.  This information is not intended to replace advice given to you by your health care provider. Make sure you discuss any questions you have with your health care provider. Document Released: 08/28/2007 Document Revised: 02/07/2016 Document Reviewed: 02/07/2016 Elsevier Interactive Patient Education  2017 [place] Maintenance, Male A healthy lifestyle and preventive care is important for your health and wellness. Ask your health care provider about what schedule of regular examinations is right for you. What should I know about weight and diet?  Eat a Healthy Diet  Eat plenty of vegetables, fruits, whole grains, low-fat dairy products, and lean protein.  Do not eat a lot of foods high in solid fats, added sugars, or salt. Maintain a Healthy Weight  Regular exercise can help you achieve or maintain a healthy weight. You should:  Do at least 150 minutes of exercise each week. The exercise should increase your  heart rate and make you sweat (moderate-intensity exercise).  Do strength-training exercises at least twice a week. Watch Your Levels of Cholesterol and Blood Lipids  Have your blood tested for lipids and cholesterol every 5 years starting at 57 years of age. If you are at high risk for heart disease, you should start having your blood tested when you are 56 years old. You may need to have your cholesterol levels checked more often if:  Your lipid or cholesterol levels are high.  You are older than 56 years of age.  You are at high risk for heart disease. What should I know about cancer screening? Many types of cancers can be detected early and may often be prevented. Lung Cancer  You should be screened every year for lung cancer if:  You are a current smoker who has smoked for at least 30 years.  You are a former smoker who has quit within the past 15 years.  Talk to your health care provider about your screening options, when you should start screening, and how often you should be screened. Colorectal Cancer  Routine colorectal cancer screening usually begins at 56 years of age and should be repeated every 5-10 years until you are 56 years old. You may need to be screened more often if early forms of precancerous polyps or small growths are found. Your health care provider may recommend screening at an earlier age if you have risk factors for colon cancer.  Your health care provider may recommend using home test kits to check for hidden blood in the stool.  A small camera at the end of a tube can be used to examine your colon (sigmoidoscopy or colonoscopy). This checks for the earliest forms of colorectal cancer. Prostate and Testicular Cancer  Depending on your age and overall health, your health care provider may do certain tests to screen for prostate and testicular cancer.  Talk to your health care provider about any symptoms or concerns you have about testicular or prostate  cancer. Skin Cancer  Check your skin from head to toe regularly.  Tell your health care provider about any new moles or changes in moles, especially if:  There is a change in a mole's size, shape, or color.  You have a mole that is larger than a pencil eraser.  Always use sunscreen. Apply sunscreen liberally and repeat throughout the day.  Protect yourself by wearing long sleeves, pants, a wide-brimmed hat, and sunglasses when outside. What should I know about heart disease, diabetes, and high blood pressure?  If you  are 89-21 years of age, have your blood pressure checked every 3-5 years. If you are 34 years of age or older, have your blood pressure checked every year. You should have your blood pressure measured twice-once when you are at a hospital or clinic, and once when you are not at a hospital or clinic. Record the average of the two measurements. To check your blood pressure when you are not at a hospital or clinic, you can use:  An automated blood pressure machine at a pharmacy.  A home blood pressure monitor.  Talk to your health care provider about your target blood pressure.  If you are between 49-3 years old, ask your health care provider if you should take aspirin to prevent heart disease.  Have regular diabetes screenings by checking your fasting blood sugar level.  If you are at a normal weight and have a low risk for diabetes, have this test once every three years after the age of 57.  If you are overweight and have a high risk for diabetes, consider being tested at a younger age or more often.  A one-time screening for abdominal aortic aneurysm (AAA) by ultrasound is recommended for men aged 62-75 years who are current or former smokers. What should I know about preventing infection? Hepatitis B  If you have a higher risk for hepatitis B, you should be screened for this virus. Talk with your health care provider to find out if you are at risk for hepatitis B  infection. Hepatitis C  Blood testing is recommended for:  Everyone born from [birth year] through 1965.  Anyone with known risk factors for hepatitis C. Sexually Transmitted Diseases (STDs)  You should be screened each year for STDs including gonorrhea and chlamydia if:  You are sexually active and are younger than 56 years of age.  You are older than 56 years of age and your health care provider tells you that you are at risk for this type of infection.  Your sexual activity has changed since you were last screened and you are at an increased risk for chlamydia or gonorrhea. Ask your health care provider if you are at risk.  Talk with your health care provider about whether you are at high risk of being infected with HIV. Your health care provider may recommend a prescription medicine to help prevent HIV infection. What else can I do?  Schedule regular health, dental, and eye exams.  Stay current with your vaccines (immunizations).  Do not use any tobacco products, such as cigarettes, chewing tobacco, and e-cigarettes. If you need help quitting, ask your health care provider.  Limit alcohol intake to no more than 2 drinks per day. One drink equals 12 ounces of beer, 5 ounces of wine, or 1 ounces of hard liquor.  Do not use street drugs.  Do not share needles.  Ask your health care provider for help if you need support or information about quitting drugs.  Tell your health care provider if you often feel depressed.  Tell your health care provider if you have ever been abused or do not feel safe at home. This information is not intended to replace advice given to you by your health care provider. Make sure you discuss any questions you have with your health care provider. Document Released: 09/07/2007 Document Revised: 11/08/2015 Document Reviewed: 12/13/2014 Elsevier Interactive Patient Education  2017 [place]. pneumPneumococcal Polysaccharide Vaccine: What You Need to  Know 1. Why get vaccinated? Vaccination can protect older adults (  and some children and younger adults) from pneumococcal disease. Pneumococcal disease is caused by bacteria that can spread from person to person through close contact. It can cause ear infections, and it can also lead to more serious infections of the:  Lungs (pneumonia),  Blood (bacteremia), and  Covering of the brain and spinal cord (meningitis). Meningitis can cause deafness and brain damage, and it can be fatal. Anyone can get pneumococcal disease, but children under 54 years of age, people with certain medical conditions, adults over 48 years of age, and cigarette smokers are at the highest risk. About 18,000 older adults die each year from pneumococcal disease in the [place]. Treatment of pneumococcal infections with penicillin and other drugs used to be more effective. But some strains of the disease have become resistant to these drugs. This makes prevention of the disease, through vaccination, even more important. 2. Pneumococcal polysaccharide vaccine (PPSV23) Pneumococcal polysaccharide vaccine (PPSV23) protects against 23 types of pneumococcal bacteria. It will not prevent all pneumococcal disease. PPSV23 is recommended for:  All adults 64 years of age and older,  Anyone 2 through 56 years of age with certain long-term health problems,  Anyone 2 through 56 years of age with a weakened immune system,  Adults 20 through 56 years of age who smoke cigarettes or have asthma. Most people need only one dose of PPSV. A second dose is recommended for certain high-risk groups. People 41 and older should get a dose even if they have gotten one or more doses of the vaccine before they turned 65. Your healthcare provider can give you more information about these recommendations. Most healthy adults develop protection within 2 to 3 weeks of getting the shot. 3. Some people should not get this vaccine  Anyone who has  had a life-threatening allergic reaction to PPSV should not get another dose.  Anyone who has a severe allergy to any component of PPSV should not receive it. Tell your provider if you have any severe allergies.  Anyone who is moderately or severely ill when the shot is scheduled may be asked to wait until they recover before getting the vaccine. Someone with a mild illness can usually be vaccinated.  Children less than 86 years of age should not receive this vaccine.  There is no evidence that PPSV is harmful to either a pregnant woman or to her fetus. However, as a precaution, women who need the vaccine should be vaccinated before becoming pregnant, if possible. 4. Risks of a vaccine reaction With any medicine, including vaccines, there is a chance of side effects. These are usually mild and go away on their own, but serious reactions are also possible. About half of people who get PPSV have mild side effects, such as redness or pain where the shot is given, which go away within about two days. Less than 1 out of 100 people develop a fever, muscle aches, or more severe local reactions. Problems that could happen after any vaccine:   People sometimes faint after a medical procedure, including vaccination. Sitting or lying down for about 15 minutes can help prevent fainting, and injuries caused by a fall. Tell your doctor if you feel dizzy, or have vision changes or ringing in the ears.  Some people get severe pain in the shoulder and have difficulty moving the arm where a shot was given. This happens very rarely.  Any medication can cause a severe allergic reaction. Such reactions from a vaccine are very rare, estimated at about  1 in a million doses, and would happen within a few minutes to a few hours after the vaccination. As with any medicine, there is a very remote chance of a vaccine causing a serious injury or death. The safety of vaccines is always being monitored. For more information,  visit: http://www.aguilar.org/ 5. What if there is a serious reaction? What should I look for?  Look for anything that concerns you, such as signs of a severe allergic reaction, very high fever, or unusual behavior. Signs of a severe allergic reaction can include hives, swelling of the face and throat, difficulty breathing, a fast heartbeat, dizziness, and weakness. These would usually start a few minutes to a few hours after the vaccination. What should I do?  If you think it is a severe allergic reaction or other emergency that can't wait, call 9-1-1 or get to the nearest hospital. Otherwise, call your doctor. Afterward, the reaction should be reported to the Vaccine Adverse Event Reporting System (VAERS). Your doctor might file this report, or you can do it yourself through the VAERS web site at www.vaers.SamedayNews.es, or by calling (979)176-5376. VAERS does not give medical advice.  6. How can I learn more?  Ask your doctor. He or she can give you the vaccine package insert or suggest other sources of information.  Call your local or state health department.  Contact the Centers for Disease Control and Prevention (CDC):  Call 5394518688 (1-800-CDC-INFO) or  Visit CDC's website at http://hunter.com/ CDC Pneumococcal Polysaccharide Vaccine VIS (07/16/13) This information is not intended to replace advice given to you by your health care provider. Make sure you discuss any questions you have with your health care provider. Document Released: 01/06/2006 Document Revised: 11/30/2015 Document Reviewed: 11/30/2015 Elsevier Interactive Patient Education  2017 [employer].

## 2016-07-02 ENCOUNTER — Other Ambulatory Visit: Payer: Self-pay | Admitting: Internal Medicine

## 2016-07-02 LAB — LIPID PANEL
CHOL/HDL RATIO: 4.3 ratio (ref 0.0–5.0)
Cholesterol, Total: 212 mg/dL — ABNORMAL HIGH (ref 100–199)
HDL: 49 mg/dL (ref >39)
LDL Calculated: 147 mg/dL — ABNORMAL HIGH (ref 0–99)
TRIGLYCERIDES: 79 mg/dL (ref 0–149)
VLDL Cholesterol Cal: 16 mg/dL (ref 5–40)

## 2016-07-02 LAB — PSA: PROSTATE SPECIFIC AG, SERUM: 1.2 ng/mL (ref 0.0–4.0)

## 2016-07-02 LAB — TSH: TSH: 1.38 u[IU]/mL (ref 0.450–4.500)

## 2016-07-02 LAB — MICROALBUMIN / CREATININE URINE RATIO
CREATININE, UR: 192.8 mg/dL
MICROALB/CREAT RATIO: 3.6 mg/g{creat} (ref 0.0–30.0)
MICROALBUM., U, RANDOM: 7 ug/mL

## 2016-07-02 LAB — HEPATITIS C ANTIBODY

## 2016-07-02 LAB — HIV ANTIBODY (ROUTINE TESTING W REFLEX): HIV SCREEN 4TH GENERATION: NONREACTIVE

## 2016-07-02 MED ORDER — ASPIRIN EC 81 MG PO TBEC: 81.0000 mg | DELAYED_RELEASE_TABLET | Freq: Every day | ORAL | 3 refills | Status: DC

## 2016-07-02 MED ORDER — PRAVASTATIN SODIUM 20 MG PO TABS: 20.0000 mg | ORAL_TABLET | Freq: Every day | ORAL | 3 refills | Status: DC

## 2016-07-02 MED FILL — PRAVASTATIN NA 20 MG TAB: 20 | 30 days supply | Qty: 30 | Fill #0

## 2016-07-02 MED FILL — $VIAGRA 25 MG TABLET: 25 MG | 30 days supply | Qty: 10 | Fill #0

## 2016-07-02 NOTE — Progress Notes (Unsigned)
ASCVD risk score > 20% over 10 years. Bleeding risk w/ asa 1.20%

## 2016-07-03 ENCOUNTER — Telehealth: Payer: Self-pay

## 2016-07-03 NOTE — Telephone Encounter (Signed)
Contacted pt to go over lab results pt didn't answer and was unable to lvm

## 2016-08-08 ENCOUNTER — Encounter: Payer: Self-pay | Admitting: Internal Medicine

## 2016-08-09 ENCOUNTER — Encounter: Payer: Self-pay | Admitting: Internal Medicine

## 2016-08-09 MED FILL — LOSARTAN POTASSIUM 100 MG T: 100 | 30 days supply | Qty: 30 | Fill #1

## 2016-08-09 MED FILL — ?AMLODIPINE BESYLATE 10 MG: 10 | 30 days supply | Qty: 30 | Fill #1

## 2016-08-09 MED FILL — PRAVASTATIN NA 20 MG TAB: 20 | 30 days supply | Qty: 30 | Fill #1

## 2016-08-09 MED FILL — $VIAGRA 25 MG TABLET: 25 MG | 30 days supply | Qty: 10 | Fill #1

## 2016-08-09 MED FILL — HYDROCHLOROTHIAZIDE 25 MG T: 25 | 30 days supply | Qty: 30 | Fill #1

## 2016-08-09 MED FILL — METFORMIN HCL ER 500 MG TAB: 500 | 30 days supply | Qty: 30 | Fill #1

## 2016-08-12 ENCOUNTER — Encounter: Payer: Self-pay | Admitting: Internal Medicine

## 2016-09-13 MED FILL — PRAVASTATIN NA 20 MG TAB: 20 | 30 days supply | Qty: 30 | Fill #2

## 2016-09-13 MED FILL — LOSARTAN POTASSIUM 100 MG T: 100 | 30 days supply | Qty: 30 | Fill #2

## 2016-09-13 MED FILL — $VIAGRA 25 MG TABLET: 25 MG | 30 days supply | Qty: 10 | Fill #2

## 2016-09-13 MED FILL — HYDROCHLOROTHIAZIDE 25 MG T: 25 | 30 days supply | Qty: 30 | Fill #2

## 2016-09-13 MED FILL — AMLODIPINE BESYLATE 10 MG T: 10 | 30 days supply | Qty: 30 | Fill #2

## 2016-09-13 MED FILL — METFORMIN HCL ER 500 MG TAB: 500 | 30 days supply | Qty: 30 | Fill #2

## 2016-10-01 ENCOUNTER — Encounter: Payer: Self-pay | Admitting: Family Medicine

## 2016-10-01 ENCOUNTER — Ambulatory Visit: Payer: Self-pay | Attending: Family Medicine | Admitting: Family Medicine

## 2016-10-01 VITALS — BP 117/81 | HR 97 | Temp 98.3°F | Resp 18 | Ht 74.0 in | Wt 303.4 lb

## 2016-10-01 DIAGNOSIS — Z6830 Body mass index (BMI) 30.0-30.9, adult: Secondary | ICD-10-CM | POA: Insufficient documentation

## 2016-10-01 DIAGNOSIS — B353 Tinea pedis: Secondary | ICD-10-CM | POA: Insufficient documentation

## 2016-10-01 DIAGNOSIS — H538 Other visual disturbances: Secondary | ICD-10-CM | POA: Insufficient documentation

## 2016-10-01 DIAGNOSIS — E785 Hyperlipidemia, unspecified: Secondary | ICD-10-CM | POA: Insufficient documentation

## 2016-10-01 DIAGNOSIS — I10 Essential (primary) hypertension: Secondary | ICD-10-CM | POA: Insufficient documentation

## 2016-10-01 DIAGNOSIS — E119 Type 2 diabetes mellitus without complications: Secondary | ICD-10-CM | POA: Insufficient documentation

## 2016-10-01 DIAGNOSIS — Z7984 Long term (current) use of oral hypoglycemic drugs: Secondary | ICD-10-CM | POA: Insufficient documentation

## 2016-10-01 DIAGNOSIS — E1165 Type 2 diabetes mellitus with hyperglycemia: Secondary | ICD-10-CM

## 2016-10-01 DIAGNOSIS — E669 Obesity, unspecified: Secondary | ICD-10-CM | POA: Insufficient documentation

## 2016-10-01 DIAGNOSIS — Z7982 Long term (current) use of aspirin: Secondary | ICD-10-CM | POA: Insufficient documentation

## 2016-10-01 LAB — POCT GLYCOSYLATED HEMOGLOBIN (HGB A1C): Hemoglobin A1C: 6.4

## 2016-10-01 LAB — GLUCOSE, POCT (MANUAL RESULT ENTRY): POC GLUCOSE: 105 mg/dL — AB (ref 70–99)

## 2016-10-01 MED ORDER — AMLODIPINE BESYLATE 10 MG PO TABS: 10.0000 mg | ORAL_TABLET | Freq: Every day | ORAL | 3 refills | Status: DC

## 2016-10-01 MED ORDER — LOSARTAN POTASSIUM 100 MG PO TABS: 100.0000 mg | ORAL_TABLET | Freq: Every day | ORAL | 3 refills | Status: DC

## 2016-10-01 MED ORDER — METFORMIN HCL ER 500 MG PO TB24: 500.0000 mg | ORAL_TABLET | Freq: Every day | ORAL | 3 refills | Status: DC

## 2016-10-01 MED ORDER — HYDROCHLOROTHIAZIDE 25 MG PO TABS: 25.0000 mg | ORAL_TABLET | Freq: Every day | ORAL | 3 refills | Status: DC

## 2016-10-01 MED ORDER — TERBINAFINE HCL 1 % EX CREA: 1.0000 "application " | TOPICAL_CREAM | Freq: Two times a day (BID) | CUTANEOUS | 0 refills | Status: DC

## 2016-10-01 MED ORDER — TRUEPLUS LANCETS 28G MISC: 1.0000 | Freq: Once | 12 refills | Status: AC

## 2016-10-01 MED ORDER — GLUCOSE BLOOD VI STRP: ORAL_STRIP | 12 refills | Status: DC

## 2016-10-01 NOTE — Progress Notes (Signed)
Subjective:  Patient ID: Seth Weaver, male    DOB: 19-Feb-1961  Age: 56 y.o. MRN: [id number]  CC: Hypertension and Diabetes   HPI Seth Weaver presents for  for follow up of diabetes. Symptoms: visual disturbances, blurred vision for 1 year. Symptoms have remained unchanged.. Patient denies foot ulcerations, nausea, paresthesia of the feet, polydipsia, polyuria and vomitting.  Evaluation to date has been included: fasting blood sugar, fasting lipid panel, hemoglobin A1C and microalbuminuria.  Home sugars: reports checking CBG twice a month. Treatment to date: Continued metformin which has been effective. History of hypertension.  He is not exercising and is not adherent to low salt diet.  He does not check BP  at home. Cardiac symptoms none. Patient denies chest pain, chest pressure/discomfort, claudication, dyspnea, lower extremity edema, near-syncope, orthopnea, palpitations and syncope.  Cardiovascular risk factors: diabetes mellitus, dyslipidemia, hypertension, male gender, obesity (BMI >= 30 kg/m2) and sedentary lifestyle. He is a non-smoker. Use of agents associated with hypertension: none. History of target organ damage: none.    Outpatient Medications Prior to Visit  Medication Sig Dispense Refill  . aspirin EC 81 MG tablet Take 1 tablet (81 mg total) by mouth daily. 90 tablet 3  . azithromycin (ZITHROMAX) 250 MG tablet Take 1 tablet (250 mg total) by mouth daily. Take first 2 tablets together, then 1 every day until finished. (Patient not taking: Reported on 07/01/2016) 6 tablet 0  . benzonatate (TESSALON) 100 MG capsule Take 1 capsule (100 mg total) by mouth every 8 (eight) hours. 21 capsule 0  . Blood Glucose Monitoring Suppl (BLOOD GLUCOSE METER KIT AND SUPPLIES) Dispense based on patient and insurance preference. Test blood sugar once daily as directed. (FOR ICD-9 250.00, 250.01). 1 each 0  . gabapentin (NEURONTIN) 300 MG capsule Take 1 capsule (300 mg total) by mouth 3 (three) times  daily. (Patient not taking: Reported on 10/07/2014) 90 capsule 2  . pravastatin (PRAVACHOL) 20 MG tablet Take 1 tablet (20 mg total) by mouth daily. 90 tablet 3  . sildenafil (VIAGRA) 25 MG tablet Take 1 tablet (25 mg total) by mouth daily as needed for erectile dysfunction. 30 tablet 3  . amLODipine (NORVASC) 10 MG tablet Take 1 tablet (10 mg total) by mouth daily. 90 tablet 5  . hydrochlorothiazide (HYDRODIURIL) 25 MG tablet Take 1 tablet (25 mg total) by mouth daily. 90 tablet 3  . losartan (COZAAR) 100 MG tablet Take 1 tablet (100 mg total) by mouth daily. 90 tablet 5  . metFORMIN (GLUCOPHAGE-XR) 500 MG 24 hr tablet Take 1 tablet (500 mg total) by mouth daily with breakfast. 90 tablet 3   No facility-administered medications prior to visit.     ROS Review of Systems  Constitutional: Negative.   Eyes: Negative.   Respiratory: Negative.   Cardiovascular: Negative.   Gastrointestinal: Negative.   Endocrine: Negative.   Skin: Negative.   Neurological: Negative.      Objective:  BP 117/81 (BP Location: Left Arm, Patient Position: Sitting, Cuff Size: Normal)   Pulse 97   Temp 98.3 F (36.8 C) (Oral)   Resp 18   Ht '6\' 2"'$  (1.88 m)   Wt (!) 303 lb 6.4 oz (137.6 kg)   SpO2 98%   BMI 38.95 kg/m   BP/Weight 10/01/2016 11/27/6385 07/29/4330  Systolic BP 951 884 166  Diastolic BP 81 063 84  Wt. (Lbs) 303.4 - -  BMI 38.95 - -   Physical Exam  Constitutional: He is oriented to person, place,  and time. He appears well-developed and well-nourished.  Eyes: Conjunctivae are normal. Pupils are equal, round, and reactive to light.  Neck: No JVD present.  Cardiovascular: Normal rate, regular rhythm, normal heart sounds and intact distal pulses.   Pulmonary/Chest: Effort normal and breath sounds normal.  Abdominal: Soft. Bowel sounds are normal. There is no tenderness.  Neurological: He is alert and oriented to person, place, and time.  Skin: Skin is warm and dry. Rash (bilateral plantar  surface; intertriginous areas between toes) noted.  Psychiatric: He has a normal mood and affect.  Nursing note and vitals reviewed.  Diabetic Foot Exam - Simple   Simple Foot Form Diabetic Foot exam was performed with the following findings:  Yes 10/01/2016  9:08 AM  Visual Inspection No deformities, no ulcerations, no other skin breakdown bilaterally:  Yes Sensation Testing Intact to touch and monofilament testing bilaterally:  Yes Pulse Check Posterior Tibialis and Dorsalis pulse intact bilaterally:  Yes Comments Scaly rash to bilateral plantar surface and intertriginous areas between toes.     Assessment & Plan:   Problem List Items Addressed This Visit      Cardiovascular and Mediastinum   Essential hypertension   Relevant Medications   losartan (COZAAR) 100 MG tablet   amLODipine (NORVASC) 10 MG tablet   hydrochlorothiazide (HYDRODIURIL) 25 MG tablet    Other Visit Diagnoses    Controlled type 2 diabetes mellitus without complication, without long-term current use of insulin (HCC)    -  Primary   Relevant Medications   metFORMIN (GLUCOPHAGE-XR) 500 MG 24 hr tablet   losartan (COZAAR) 100 MG tablet   glucose blood test strip   Other Relevant Orders   Lipid Panel (Completed)   POCT glycosylated hemoglobin (Hb A1C) (Completed)   Glucose (CBG) (Completed)   Ambulatory referral to Ophthalmology   Tinea pedis of both feet       Relevant Medications   terbinafine (LAMISIL AT) 1 % cream   Blurred vision, bilateral       Vision acuity screen   Relevant Orders   Ambulatory referral to Ophthalmology      Meds ordered this encounter  Medications  . metFORMIN (GLUCOPHAGE-XR) 500 MG 24 hr tablet    Sig: Take 1 tablet (500 mg total) by mouth daily with breakfast.    Dispense:  90 tablet    Refill:  3    Must have office visit for refills    Order Specific Question:   Supervising Provider    Answer:   Tresa Garter [id number]  . losartan (COZAAR) 100 MG tablet     Sig: Take 1 tablet (100 mg total) by mouth daily.    Dispense:  90 tablet    Refill:  3    Must have office visit for refills.    Order Specific Question:   Supervising Provider    Answer:   Tresa Garter [id number]  . amLODipine (NORVASC) 10 MG tablet    Sig: Take 1 tablet (10 mg total) by mouth daily.    Dispense:  90 tablet    Refill:  3    Must have office visit for refills.    Order Specific Question:   Supervising Provider    Answer:   Tresa Garter [id number]  . hydrochlorothiazide (HYDRODIURIL) 25 MG tablet    Sig: Take 1 tablet (25 mg total) by mouth daily.    Dispense:  90 tablet    Refill:  3    Must have office  visit for refills.    Order Specific Question:   Supervising Provider    Answer:   Tresa Garter [id number]  . glucose blood test strip    Sig: Use as instructed    Dispense:  100 each    Refill:  12    Order Specific Question:   Supervising Provider    Answer:   Tresa Garter [[id number]]  . TRUEPLUS LANCETS 28G MISC    Sig: 1 kit by Does not apply route once.    Dispense:  100 each    Refill:  12    Order Specific Question:   Supervising Provider    Answer:   Tresa Garter [id number]  . terbinafine (LAMISIL AT) 1 % cream    Sig: Apply 1 application topically 2 (two) times daily. To soles of both feet and in between toes.    Dispense:  30 g    Refill:  0    Order Specific Question:   Supervising Provider    Answer:   Tresa Garter [[id number]]    Follow-up: Return in about 3 months (around 01/01/2017) for HTN/DM.   Alfonse Spruce FNP

## 2016-10-01 NOTE — Patient Instructions (Addendum)
Apply for orange card to complete referral process. You will be called with your labs results.  Diabetes Mellitus and Food It is important for you to manage your blood sugar (glucose) level. Your blood glucose level can be greatly affected by what you eat. Eating healthier foods in the appropriate amounts throughout the day at about the same time each day will help you control your blood glucose level. It can also help slow or prevent worsening of your diabetes mellitus. Healthy eating may even help you improve the level of your blood pressure and reach or maintain a healthy weight. General recommendations for healthful eating and cooking habits include:  Eating meals and snacks regularly. Avoid going long periods of time without eating to lose weight.  Eating a diet that consists mainly of plant-based foods, such as fruits, vegetables, nuts, legumes, and whole grains.  Using low-heat cooking methods, such as baking, instead of high-heat cooking methods, such as deep frying.  Work with your dietitian to make sure you understand how to use the Nutrition Facts information on food labels. How can food affect me? Carbohydrates Carbohydrates affect your blood glucose level more than any other type of food. Your dietitian will help you determine how many carbohydrates to eat at each meal and teach you how to count carbohydrates. Counting carbohydrates is important to keep your blood glucose at a healthy level, especially if you are using insulin or taking certain medicines for diabetes mellitus. Alcohol Alcohol can cause sudden decreases in blood glucose (hypoglycemia), especially if you use insulin or take certain medicines for diabetes mellitus. Hypoglycemia can be a life-threatening condition. Symptoms of hypoglycemia (sleepiness, dizziness, and disorientation) are similar to symptoms of having too much alcohol. If your health care provider has given you approval to drink alcohol, do so in moderation  and use the following guidelines:  Women should not have more than one drink per day, and men should not have more than two drinks per day. One drink is equal to: ? 12 oz of beer. ? 5 oz of wine. ? 1 oz of hard liquor.  Do not drink on an empty stomach.  Keep yourself hydrated. Have water, diet soda, or unsweetened iced tea.  Regular soda, juice, and other mixers might contain a lot of carbohydrates and should be counted.  What foods are not recommended? As you make food choices, it is important to remember that all foods are not the same. Some foods have fewer nutrients per serving than other foods, even though they might have the same number of calories or carbohydrates. It is difficult to get your body what it needs when you eat foods with fewer nutrients. Examples of foods that you should avoid that are high in calories and carbohydrates but low in nutrients include:  Trans fats (most processed foods list trans fats on the Nutrition Facts label).  Regular soda.  Juice.  Candy.  Sweets, such as cake, pie, doughnuts, and cookies.  Fried foods.  What foods can I eat? Eat nutrient-rich foods, which will nourish your body and keep you healthy. The food you should eat also will depend on several factors, including:  The calories you need.  The medicines you take.  Your weight.  Your blood glucose level.  Your blood pressure level.  Your cholesterol level.  You should eat a variety of foods, including:  Protein. ? Lean cuts of meat. ? Proteins low in saturated fats, such as fish, egg whites, and beans. Avoid processed meats.  Fruits and vegetables. ? Fruits and vegetables that may help control blood glucose levels, such as apples, mangoes, and yams.  Dairy products. ? Choose fat-free or low-fat dairy products, such as milk, yogurt, and cheese.  Grains, bread, pasta, and rice. ? Choose whole grain products, such as multigrain bread, whole oats, and brown rice.  These foods may help control blood pressure.  Fats. ? Foods containing healthful fats, such as nuts, avocado, olive oil, canola oil, and fish.  Does everyone with diabetes mellitus have the same meal plan? Because every person with diabetes mellitus is different, there is not one meal plan that works for everyone. It is very important that you meet with a dietitian who will help you create a meal plan that is just right for you. This information is not intended to replace advice given to you by your health care provider. Make sure you discuss any questions you have with your health care provider. Document Released: 12/06/2004 Document Revised: 08/17/2015 Document Reviewed: 02/05/2013 Elsevier Interactive Patient Education  2017 [place] Athlete's foot (tinea pedis) is a fungal infection of the skin on the feet. It often occurs on the skin that is between or underneath the toes. It can also occur on the soles of the feet. The infection can spread from person to person (is contagious). Follow these instructions at home:  Apply or take over-the-counter and prescription medicines only as told by your doctor.  Keep all follow-up visits as told by your doctor. This is important.  Do not scratch your feet.  Keep your feet dry: ? Wear cotton or wool socks. Change your socks every day or if they become wet. ? Wear shoes that allow air to move around, such as sandals or canvas tennis shoes.  Wash and dry your feet: ? Every day or as told by your doctor. ? After exercising. ? Including the area between your toes.  Wear sandals in wet areas, such as locker rooms and shared showers.  Do not share any of these items: ? Towels. ? Nail clippers. ? Other personal items that touch your feet.  If you have diabetes, keep your blood sugar under control. Contact a doctor if:  You have a fever.  You have swelling, soreness, warmth, or redness in your foot.  You are not  getting better with treatment.  Your symptoms get worse.  You have new symptoms. This information is not intended to replace advice given to you by your health care provider. Make sure you discuss any questions you have with your health care provider. Document Released: 08/28/2007 Document Revised: 08/17/2015 Document Reviewed: 09/12/2014 Elsevier Interactive Patient Education  2018 [employer].

## 2016-10-01 NOTE — Progress Notes (Signed)
Patient  is here for establish care

## 2016-10-02 LAB — LIPID PANEL
CHOL/HDL RATIO: 3.9 ratio (ref 0.0–5.0)
Cholesterol, Total: 164 mg/dL (ref 100–199)
HDL: 42 mg/dL (ref >39)
LDL CALC: 102 mg/dL — AB (ref 0–99)
Triglycerides: 99 mg/dL (ref 0–149)
VLDL CHOLESTEROL CAL: 20 mg/dL (ref 5–40)

## 2016-10-07 ENCOUNTER — Other Ambulatory Visit: Payer: Self-pay | Admitting: Family Medicine

## 2016-10-07 ENCOUNTER — Telehealth: Payer: Self-pay

## 2016-10-07 NOTE — Telephone Encounter (Signed)
CMA call regarding lab results  Patient di not answer & no VM set up

## 2016-10-07 NOTE — Telephone Encounter (Signed)
-----   Message from Alfonse Spruce, [place] sent at 10/07/2016  9:26 AM EDT ----- Lipid levels have improved on current dosage of pravastatin. Will continue pravastatin at current dosage. Eat a diet low in saturated fat. Limit your intake of fried foods, red meats, and whole milk. Increase physical activity.

## 2016-11-05 MED FILL — AMLODIPINE BESYLATE 10 MG T: 10 | 30 days supply | Qty: 30 | Fill #0

## 2016-11-05 MED FILL — LOSARTAN POTASSIUM 100 MG T: 100 | 30 days supply | Qty: 30 | Fill #0

## 2016-11-05 MED FILL — HYDROCHLOROTHIAZIDE 25 MG T: 25 | 30 days supply | Qty: 30 | Fill #0

## 2016-11-05 MED FILL — METFORMIN HCL ER 500 MG TAB: 500 | 30 days supply | Qty: 30 | Fill #0

## 2016-11-05 MED FILL — PRAVASTATIN NA 20 MG TAB: 20 | 30 days supply | Qty: 30 | Fill #3

## 2016-12-23 MED FILL — HYDROCHLOROTHIAZIDE 25 MG T: 25 | 30 days supply | Qty: 30 | Fill #1

## 2016-12-23 MED FILL — METFORMIN HCL ER 500 MG TAB: 500 | 30 days supply | Qty: 30 | Fill #1

## 2016-12-23 MED FILL — AMLODIPINE BESYLATE 10 MG T: 10 | 30 days supply | Qty: 30 | Fill #1

## 2016-12-23 MED FILL — PRAVASTATIN NA 20 MG TAB: 20 | 30 days supply | Qty: 30 | Fill #4

## 2016-12-23 MED FILL — LOSARTAN POTASSIUM 100 MG T: 100 | 30 days supply | Qty: 30 | Fill #1

## 2017-01-01 ENCOUNTER — Ambulatory Visit: Payer: Self-pay | Admitting: Family Medicine

## 2017-01-27 ENCOUNTER — Encounter: Payer: Self-pay | Admitting: Family Medicine

## 2017-01-27 ENCOUNTER — Ambulatory Visit: Payer: Self-pay | Attending: Family Medicine | Admitting: Family Medicine

## 2017-01-27 VITALS — BP 129/81 | HR 91 | Temp 99.0°F | Resp 18 | Ht 74.0 in | Wt 308.8 lb

## 2017-01-27 DIAGNOSIS — Z7984 Long term (current) use of oral hypoglycemic drugs: Secondary | ICD-10-CM | POA: Insufficient documentation

## 2017-01-27 DIAGNOSIS — Z6839 Body mass index (BMI) 39.0-39.9, adult: Secondary | ICD-10-CM | POA: Insufficient documentation

## 2017-01-27 DIAGNOSIS — E785 Hyperlipidemia, unspecified: Secondary | ICD-10-CM

## 2017-01-27 DIAGNOSIS — E669 Obesity, unspecified: Secondary | ICD-10-CM | POA: Insufficient documentation

## 2017-01-27 DIAGNOSIS — I10 Essential (primary) hypertension: Secondary | ICD-10-CM

## 2017-01-27 DIAGNOSIS — E1169 Type 2 diabetes mellitus with other specified complication: Secondary | ICD-10-CM

## 2017-01-27 DIAGNOSIS — Z800 Family history of malignant neoplasm of digestive organs: Secondary | ICD-10-CM

## 2017-01-27 DIAGNOSIS — Z7982 Long term (current) use of aspirin: Secondary | ICD-10-CM | POA: Insufficient documentation

## 2017-01-27 DIAGNOSIS — B353 Tinea pedis: Secondary | ICD-10-CM

## 2017-01-27 DIAGNOSIS — Z79899 Other long term (current) drug therapy: Secondary | ICD-10-CM | POA: Insufficient documentation

## 2017-01-27 DIAGNOSIS — E118 Type 2 diabetes mellitus with unspecified complications: Secondary | ICD-10-CM

## 2017-01-27 DIAGNOSIS — E119 Type 2 diabetes mellitus without complications: Secondary | ICD-10-CM

## 2017-01-27 LAB — POCT GLYCOSYLATED HEMOGLOBIN (HGB A1C): HEMOGLOBIN A1C: 6.2

## 2017-01-27 LAB — GLUCOSE, POCT (MANUAL RESULT ENTRY): POC Glucose: 119 mg/dl — AB (ref 70–99)

## 2017-01-27 MED ORDER — LOSARTAN POTASSIUM 100 MG PO TABS: 100.0000 mg | ORAL_TABLET | Freq: Every day | ORAL | 3 refills | Status: DC

## 2017-01-27 MED ORDER — AMLODIPINE BESYLATE 10 MG PO TABS: 10.0000 mg | ORAL_TABLET | Freq: Every day | ORAL | 3 refills | Status: DC

## 2017-01-27 MED ORDER — HYDROCHLOROTHIAZIDE 25 MG PO TABS: 25.0000 mg | ORAL_TABLET | Freq: Every day | ORAL | 3 refills | Status: DC

## 2017-01-27 MED ORDER — METFORMIN HCL ER 500 MG PO TB24: 500.0000 mg | ORAL_TABLET | Freq: Every day | ORAL | 3 refills | Status: DC

## 2017-01-27 MED ORDER — ASPIRIN EC 81 MG PO TBEC: 81.0000 mg | DELAYED_RELEASE_TABLET | Freq: Every day | ORAL | 3 refills | Status: DC

## 2017-01-27 MED ORDER — TERBINAFINE HCL 1 % EX CREA: 1.0000 "application " | TOPICAL_CREAM | Freq: Two times a day (BID) | CUTANEOUS | 0 refills | Status: DC

## 2017-01-27 MED ORDER — PRAVASTATIN SODIUM 20 MG PO TABS: 20.0000 mg | ORAL_TABLET | Freq: Every day | ORAL | 3 refills | Status: DC

## 2017-01-27 NOTE — Progress Notes (Signed)
Subjective:  Patient ID: Seth Weaver, male    DOB: 06/11/1960  Age: 56 y.o. MRN: [id number]  CC: Diabetes and Hypertension   HPI Seth Weaver presents for  for follow up of diabetes. Symptoms: none. Patient denies foot ulcerations, nausea, paresthesia of the feet, polydipsia, polyuria and vomitting.  Evaluation to date has been included: fasting blood sugar, fasting lipid panel, hemoglobin A1C and microalbuminuria.  Inconsistent with CBG's. Treatment to date: Continued metformin which has been effective. History of hypertension.  He is not exercising and is adherent to low salt diet.  He does not check BP  at home. Cardiac symptoms none. Patient denies chest pain, chest pressure/discomfort, claudication, dyspnea, lower extremity edema, near-syncope, orthopnea, palpitations and syncope.  Cardiovascular risk factors: diabetes mellitus, dyslipidemia, hypertension, male gender, obesity (BMI >= 30 kg/m2) and sedentary lifestyle. He is a non-smoker. Use of agents associated with hypertension: none. History of target organ damage: none.     Outpatient Medications Prior to Visit  Medication Sig Dispense Refill  . Blood Glucose Monitoring Suppl (BLOOD GLUCOSE METER KIT AND SUPPLIES) Dispense based on patient and insurance preference. Test blood sugar once daily as directed. (FOR ICD-9 250.00, 250.01). 1 each 0  . glucose blood test strip Use as instructed 100 each 12  . sildenafil (VIAGRA) 25 MG tablet Take 1 tablet (25 mg total) by mouth daily as needed for erectile dysfunction. 30 tablet 3  . amLODipine (NORVASC) 10 MG tablet Take 1 tablet (10 mg total) by mouth daily. 90 tablet 3  . aspirin EC 81 MG tablet Take 1 tablet (81 mg total) by mouth daily. 90 tablet 3  . azithromycin (ZITHROMAX) 250 MG tablet Take 1 tablet (250 mg total) by mouth daily. Take first 2 tablets together, then 1 every day until finished. (Patient not taking: Reported on 07/01/2016) 6 tablet 0  . benzonatate (TESSALON) 100 MG  capsule Take 1 capsule (100 mg total) by mouth every 8 (eight) hours. 21 capsule 0  . gabapentin (NEURONTIN) 300 MG capsule Take 1 capsule (300 mg total) by mouth 3 (three) times daily. (Patient not taking: Reported on 10/07/2014) 90 capsule 2  . hydrochlorothiazide (HYDRODIURIL) 25 MG tablet Take 1 tablet (25 mg total) by mouth daily. 90 tablet 3  . losartan (COZAAR) 100 MG tablet Take 1 tablet (100 mg total) by mouth daily. 90 tablet 3  . metFORMIN (GLUCOPHAGE-XR) 500 MG 24 hr tablet Take 1 tablet (500 mg total) by mouth daily with breakfast. 90 tablet 3  . pravastatin (PRAVACHOL) 20 MG tablet Take 1 tablet (20 mg total) by mouth daily. 90 tablet 3  . terbinafine (LAMISIL AT) 1 % cream Apply 1 application topically 2 (two) times daily. To soles of both feet and in between toes. 30 g 0   No facility-administered medications prior to visit.     ROS Review of Systems  Constitutional: Negative.   Eyes: Negative.   Respiratory: Negative.   Cardiovascular: Negative.   Gastrointestinal: Negative.   Endocrine: Negative.   Skin: Negative.   Neurological: Negative.      Objective:  BP 129/81   Pulse 91   Temp 99 F (37.2 C) (Oral)   Resp 18   Ht '6\' 2"'$  (1.88 m)   Wt (!) 308 lb 12.8 oz (140.1 kg)   SpO2 95%   BMI 39.65 kg/m   BP/Weight 01/27/2017 9/83/3825 0/07/3974  Systolic BP 734 193 790  Diastolic BP 81 81 240  Wt. (Lbs) 308.8 303.4 -  BMI  39.65 38.95 -   Physical Exam  Constitutional: He is oriented to person, place, and time. He appears well-developed and well-nourished.  Eyes: Conjunctivae are normal. Pupils are equal, round, and reactive to light.  Neck: No JVD present.  Cardiovascular: Normal rate, regular rhythm, normal heart sounds and intact distal pulses.  Pulmonary/Chest: Effort normal and breath sounds normal.  Abdominal: Soft. Bowel sounds are normal. There is no tenderness.  Neurological: He is alert and oriented to person, place, and time.  Skin: Skin is warm  and dry. Rash (bilateral plantar surface; intertriginous areas between toes) noted.  Psychiatric: He has a normal mood and affect.  Nursing note and vitals reviewed.    Assessment & Plan:   1. Type 2 diabetes mellitus with complication, unspecified whether long term insulin use (HCC)  - HgB A1c - Glucose (CBG) - Ambulatory referral to Ophthalmology  2. Controlled type 2 diabetes mellitus without complication, without long-term current use of insulin (HCC)  - metFORMIN (GLUCOPHAGE-XR) 500 MG 24 hr tablet; Take 1 tablet (500 mg total) daily with breakfast by mouth.  Dispense: 90 tablet; Refill: 3  3. Essential hypertension  - amLODipine (NORVASC) 10 MG tablet; Take 1 tablet (10 mg total) daily by mouth.  Dispense: 90 tablet; Refill: 3 - hydrochlorothiazide (HYDRODIURIL) 25 MG tablet; Take 1 tablet (25 mg total) daily by mouth.  Dispense: 90 tablet; Refill: 3 - losartan (COZAAR) 100 MG tablet; Take 1 tablet (100 mg total) daily by mouth.  Dispense: 90 tablet; Refill: 3 - aspirin EC 81 MG tablet; Take 1 tablet (81 mg total) daily by mouth.  Dispense: 90 tablet; Refill: 3  4. Tinea pedis of both feet  - terbinafine (LAMISIL AT) 1 % cream; Apply 1 application 2 (two) times daily topically. To soles of both feet and in between toes.  Dispense: 30 g; Refill: 0  5. Family history of colon cancer When asked about colon cancer screening he reports family history of colon cancer. - Ambulatory referral to Gastroenterology  6. Hyperlipidemia associated with type 2 diabetes mellitus (HCC)  - pravastatin (PRAVACHOL) 20 MG tablet; Take 1 tablet (20 mg total) daily by mouth.  Dispense: 90 tablet; Refill: 3     Follow-up: Return in about 1 month (around 02/26/2017) for Physical.   Alfonse Spruce FNP

## 2017-01-27 NOTE — Patient Instructions (Signed)

## 2017-02-26 ENCOUNTER — Encounter: Payer: Self-pay | Admitting: Family Medicine

## 2017-05-27 MED FILL — AMLODIPINE BESYLATE 10 MG T: 10 | 30 days supply | Qty: 30 | Fill #2

## 2017-05-27 MED FILL — HYDROCHLOROTHIAZIDE 25 MG T: 25 | 30 days supply | Qty: 30 | Fill #2

## 2017-05-27 MED FILL — LOSARTAN POTASSIUM 100 MG T: 100 | 30 days supply | Qty: 30 | Fill #2

## 2017-05-27 MED FILL — PRAVASTATIN NA 20 MG TAB: 20 | 30 days supply | Qty: 30 | Fill #5

## 2017-05-27 MED FILL — METFORMIN HCL ER 500 MG TAB: 500 | 30 days supply | Qty: 30 | Fill #2

## 2017-06-25 ENCOUNTER — Ambulatory Visit: Payer: Self-pay | Attending: Family Medicine | Admitting: Family Medicine

## 2017-06-25 ENCOUNTER — Encounter: Payer: Self-pay | Admitting: Family Medicine

## 2017-06-25 VITALS — BP 130/85 | HR 83 | Temp 97.7°F | Ht 74.0 in | Wt 310.4 lb

## 2017-06-25 DIAGNOSIS — R2241 Localized swelling, mass and lump, right lower limb: Secondary | ICD-10-CM | POA: Insufficient documentation

## 2017-06-25 DIAGNOSIS — Z8249 Family history of ischemic heart disease and other diseases of the circulatory system: Secondary | ICD-10-CM | POA: Insufficient documentation

## 2017-06-25 DIAGNOSIS — E1169 Type 2 diabetes mellitus with other specified complication: Secondary | ICD-10-CM | POA: Insufficient documentation

## 2017-06-25 DIAGNOSIS — Z833 Family history of diabetes mellitus: Secondary | ICD-10-CM | POA: Insufficient documentation

## 2017-06-25 DIAGNOSIS — E119 Type 2 diabetes mellitus without complications: Secondary | ICD-10-CM

## 2017-06-25 DIAGNOSIS — Z7984 Long term (current) use of oral hypoglycemic drugs: Secondary | ICD-10-CM | POA: Insufficient documentation

## 2017-06-25 DIAGNOSIS — Z79899 Other long term (current) drug therapy: Secondary | ICD-10-CM | POA: Insufficient documentation

## 2017-06-25 DIAGNOSIS — Z1211 Encounter for screening for malignant neoplasm of colon: Secondary | ICD-10-CM

## 2017-06-25 DIAGNOSIS — E118 Type 2 diabetes mellitus with unspecified complications: Secondary | ICD-10-CM

## 2017-06-25 DIAGNOSIS — E785 Hyperlipidemia, unspecified: Secondary | ICD-10-CM | POA: Insufficient documentation

## 2017-06-25 DIAGNOSIS — I10 Essential (primary) hypertension: Secondary | ICD-10-CM | POA: Insufficient documentation

## 2017-06-25 DIAGNOSIS — Z7982 Long term (current) use of aspirin: Secondary | ICD-10-CM | POA: Insufficient documentation

## 2017-06-25 LAB — POCT GLYCOSYLATED HEMOGLOBIN (HGB A1C): HEMOGLOBIN A1C: 6.1

## 2017-06-25 LAB — GLUCOSE, POCT (MANUAL RESULT ENTRY): POC Glucose: 99 mg/dl (ref 70–99)

## 2017-06-25 MED ORDER — METFORMIN HCL ER 500 MG PO TB24: 500.0000 mg | ORAL_TABLET | Freq: Every day | ORAL | 1 refills | Status: DC

## 2017-06-25 MED ORDER — HYDROCHLOROTHIAZIDE 25 MG PO TABS: 25.0000 mg | ORAL_TABLET | Freq: Every day | ORAL | 1 refills | Status: DC

## 2017-06-25 MED ORDER — LOSARTAN POTASSIUM 100 MG PO TABS: 100.0000 mg | ORAL_TABLET | Freq: Every day | ORAL | 1 refills | Status: DC

## 2017-06-25 MED ORDER — PRAVASTATIN SODIUM 20 MG PO TABS: 20.0000 mg | ORAL_TABLET | Freq: Every day | ORAL | 1 refills | Status: DC

## 2017-06-25 MED ORDER — AMLODIPINE BESYLATE 10 MG PO TABS: 10.0000 mg | ORAL_TABLET | Freq: Every day | ORAL | 1 refills | Status: DC

## 2017-06-25 NOTE — Progress Notes (Signed)
Subjective:  Patient ID: Seth Weaver, male    DOB: 10-Aug-1960  Age: 57 y.o. MRN: [id number]  CC: Hypertension   HPI Seth Weaver is a 57 year old male with a history of type 2 diabetes mellitus (A1c 6.1), hypertension, hyperlipidemia who presents to establish care with me as he was previously followed by Fredia Beets FNP.  He is concerned about a posterior right thigh swelling which has been present for a couple of months, unchanged in size and nontender probably would like excised. With regards to his diabetes mellitus he is doing well on metformin and denies adverse effects, denies visual concerns, paresthesias,  Hypoglycemia. He has not had a recent eye exam. Also tolerating his antihypertensive and statin with no complaints of myalgias or other adverse effects He does not exercise much but is adherent with a low cholesterol, low sodium, diabetic diet. He has no additional concerns today.  Past Medical History:  Diagnosis Date  . Diabetes mellitus without complication ([place])   . Hypertension     History reviewed. No pertinent surgical history.  Family History  Problem Relation Age of Onset  . Diabetes Father   . Hypertension Father     No Known Allergies   Outpatient Medications Prior to Visit  Medication Sig Dispense Refill  . aspirin EC 81 MG tablet Take 1 tablet (81 mg total) daily by mouth. 90 tablet 3  . Blood Glucose Monitoring Suppl (BLOOD GLUCOSE METER KIT AND SUPPLIES) Dispense based on patient and insurance preference. Test blood sugar once daily as directed. (FOR ICD-9 250.00, 250.01). 1 each 0  . glucose blood test strip Use as instructed 100 each 12  . amLODipine (NORVASC) 10 MG tablet Take 1 tablet (10 mg total) daily by mouth. 90 tablet 3  . hydrochlorothiazide (HYDRODIURIL) 25 MG tablet Take 1 tablet (25 mg total) daily by mouth. 90 tablet 3  . losartan (COZAAR) 100 MG tablet Take 1 tablet (100 mg total) daily by mouth. 90 tablet 3  . metFORMIN  (GLUCOPHAGE-XR) 500 MG 24 hr tablet Take 1 tablet (500 mg total) daily with breakfast by mouth. 90 tablet 3  . pravastatin (PRAVACHOL) 20 MG tablet Take 1 tablet (20 mg total) daily by mouth. 90 tablet 3  . sildenafil (VIAGRA) 25 MG tablet Take 1 tablet (25 mg total) by mouth daily as needed for erectile dysfunction. (Patient not taking: Reported on 06/25/2017) 30 tablet 3  . terbinafine (LAMISIL AT) 1 % cream Apply 1 application 2 (two) times daily topically. To soles of both feet and in between toes. (Patient not taking: Reported on 06/25/2017) 30 g 0   No facility-administered medications prior to visit.     ROS Review of Systems  Constitutional: Negative for activity change and appetite change.  HENT: Negative for sinus pressure and sore throat.   Eyes: Negative for visual disturbance.  Respiratory: Negative for cough, chest tightness and shortness of breath.   Cardiovascular: Negative for chest pain and leg swelling.  Gastrointestinal: Negative for abdominal distention, abdominal pain, constipation and diarrhea.  Endocrine: Negative.   Genitourinary: Negative for dysuria.  Musculoskeletal: Negative for joint swelling and myalgias.  Skin: Negative for rash.  Allergic/Immunologic: Negative.   Neurological: Negative for weakness, light-headedness and numbness.  Psychiatric/Behavioral: Negative for dysphoric mood and suicidal ideas.    Objective:  BP 130/85   Pulse 83   Temp 97.7 F (36.5 C) (Oral)   Ht _0  (1.88 m)   Wt (!) 310 lb 6.4 oz (140.8 kg)  SpO2 98%   BMI 39.85 kg/m   BP/Weight 06/25/2017 01/27/2017 12/16/2681  Systolic BP 419 622 297  Diastolic BP 85 81 81  Wt. (Lbs) 310.4 308.8 303.4  BMI 39.85 39.65 38.95      Physical Exam  Constitutional: He is oriented to person, place, and time. He appears well-developed and well-nourished.  Cardiovascular: Normal rate, normal heart sounds and intact distal pulses.  No murmur heard. Pulmonary/Chest: Effort normal and  breath sounds normal. He has no wheezes. He has no rales. He exhibits no tenderness.  Abdominal: Soft. Bowel sounds are normal. He exhibits no distension and no mass. There is no tenderness.  Musculoskeletal: Normal range of motion.  Neurological: He is alert and oriented to person, place, and time.  Skin: Skin is warm and dry.  Psychiatric: He has a normal mood and affect.   Lipid Panel     Component Value Date/Time   CHOL 164 10/01/2016 0914   TRIG 99 10/01/2016 0914   HDL 42 10/01/2016 0914   CHOLHDL 3.9 10/01/2016 0914   CHOLHDL 4.4 01/07/2014 0921   VLDL 21 01/07/2014 0921   LDLCALC 102 (H) 10/01/2016 0914    Lab Results  Component Value Date   HGBA1C 6.1 06/25/2017    Assessment & Plan:   1. Essential hypertension Controlled Counseled on blood pressure goal of less than 130/80, low-sodium, DASH diet, medication compliance, 150 minutes of moderate intensity exercise per week. Discussed medication compliance, adverse effects. - amLODipine (NORVASC) 10 MG tablet; Take 1 tablet (10 mg total) by mouth daily.  Dispense: 90 tablet; Refill: 1 - hydrochlorothiazide (HYDRODIURIL) 25 MG tablet; Take 1 tablet (25 mg total) by mouth daily.  Dispense: 90 tablet; Refill: 1 - losartan (COZAAR) 100 MG tablet; Take 1 tablet (100 mg total) by mouth daily.  Dispense: 90 tablet; Refill: 1  2. Controlled type 2 diabetes mellitus without complication, without long-term current use of insulin (HCC) Controlled with A1c of 6.1 - POCT glucose (manual entry) - POCT glycosylated hemoglobin (Hb A1C) - CMP14+EGFR - Microalbumin/Creatinine Ratio, Urine - Ambulatory referral to Ophthalmology - metFORMIN (GLUCOPHAGE-XR) 500 MG 24 hr tablet; Take 1 tablet (500 mg total) by mouth daily with breakfast.  Dispense: 90 tablet; Refill: 1  3. Hyperlipidemia associated with type 2 diabetes mellitus (HCC) Controlled Low-cholesterol diet, lifestyle modifications - pravastatin (PRAVACHOL) 20 MG tablet; Take 1  tablet (20 mg total) by mouth daily.  Dispense: 90 tablet; Refill: 1  4. Screening for colon cancer - Ambulatory referral to Gastroenterology  5. Lump of right thigh - Ambulatory referral to General Surgery   Meds ordered this encounter  Medications  . amLODipine (NORVASC) 10 MG tablet    Sig: Take 1 tablet (10 mg total) by mouth daily.    Dispense:  90 tablet    Refill:  1  . hydrochlorothiazide (HYDRODIURIL) 25 MG tablet    Sig: Take 1 tablet (25 mg total) by mouth daily.    Dispense:  90 tablet    Refill:  1  . losartan (COZAAR) 100 MG tablet    Sig: Take 1 tablet (100 mg total) by mouth daily.    Dispense:  90 tablet    Refill:  1  . metFORMIN (GLUCOPHAGE-XR) 500 MG 24 hr tablet    Sig: Take 1 tablet (500 mg total) by mouth daily with breakfast.    Dispense:  90 tablet    Refill:  1  . pravastatin (PRAVACHOL) 20 MG tablet    Sig: Take 1  tablet (20 mg total) by mouth daily.    Dispense:  90 tablet    Refill:  1    Follow-up: Return in about 6 months (around 12/25/2017) for follow up of chronic medical conditions.   Charlott Rakes MD

## 2017-06-26 ENCOUNTER — Encounter: Payer: Self-pay | Admitting: Gastroenterology

## 2017-06-26 ENCOUNTER — Telehealth: Payer: Self-pay

## 2017-06-26 LAB — CMP14+EGFR
ALK PHOS: 68 IU/L (ref 39–117)
ALT: 19 IU/L (ref 0–44)
AST: 18 IU/L (ref 0–40)
Albumin/Globulin Ratio: 1.2 (ref 1.2–2.2)
Albumin: 3.8 g/dL (ref 3.5–5.5)
BUN/Creatinine Ratio: 14 (ref 9–20)
BUN: 15 mg/dL (ref 6–24)
Bilirubin Total: 0.4 mg/dL (ref 0.0–1.2)
CALCIUM: 9.5 mg/dL (ref 8.7–10.2)
CO2: 25 mmol/L (ref 20–29)
Chloride: 99 mmol/L (ref 96–106)
Creatinine, Ser: 1.11 mg/dL (ref 0.76–1.27)
GFR calc Af Amer: 85 mL/min/{1.73_m2} (ref >59)
GFR, EST NON AFRICAN AMERICAN: 74 mL/min/{1.73_m2} (ref >59)
GLUCOSE: 98 mg/dL (ref 65–99)
Globulin, Total: 3.2 g/dL (ref 1.5–4.5)
Potassium: 4.3 mmol/L (ref 3.5–5.2)
Sodium: 139 mmol/L (ref 134–144)
Total Protein: 7 g/dL (ref 6.0–8.5)

## 2017-06-26 LAB — MICROALBUMIN / CREATININE URINE RATIO
CREATININE, UR: 182.7 mg/dL
MICROALB/CREAT RATIO: 1.7 mg/g{creat} (ref 0.0–30.0)
MICROALBUM., U, RANDOM: 3.1 ug/mL

## 2017-06-26 NOTE — Telephone Encounter (Signed)
Patient was called to go over lab results. Patient has a voicemail that is not yet set up.

## 2017-06-27 ENCOUNTER — Telehealth: Payer: Self-pay

## 2017-06-27 NOTE — Telephone Encounter (Signed)
Patient was called and a voicemail was left informing patient to return phone call fo lab results.   If patient returns phone call please inform patient of normal lab results.

## 2017-07-07 ENCOUNTER — Ambulatory Visit (AMBULATORY_SURGERY_CENTER): Payer: Self-pay | Admitting: *Deleted

## 2017-07-07 ENCOUNTER — Other Ambulatory Visit: Payer: Self-pay

## 2017-07-07 VITALS — Ht 74.0 in | Wt 316.8 lb

## 2017-07-07 DIAGNOSIS — Z1211 Encounter for screening for malignant neoplasm of colon: Secondary | ICD-10-CM

## 2017-07-07 MED ORDER — NA SULFATE-K SULFATE-MG SULF 17.5-3.13-1.6 GM/177ML PO SOLN: 1.0000 [IU] | Freq: Once | ORAL | 0 refills | Status: AC

## 2017-07-07 MED FILL — SUPREP BOWEL PREP KIT: 17.5-3.13-1 | 1 days supply | Qty: 1 | Fill #0

## 2017-07-07 NOTE — Progress Notes (Signed)
No egg or soy allergy known to patient  No issues with past sedation with any surgeries  or procedures, no intubation problems  No diet pills per patient No home 02 use per patient  No blood thinners per patient  Pt denies issues with constipation  No A fib or A flutter  EMMI video sent to pt's e mail  

## 2017-07-09 MED FILL — PRAVASTATIN SODIUM 20 MG TA: 20 | 30 days supply | Qty: 30 | Fill #0

## 2017-07-09 MED FILL — LOSARTAN POTASSIUM 100 MG T: 100 | 30 days supply | Qty: 30 | Fill #0

## 2017-07-09 MED FILL — HYDROCHLOROTHIAZIDE 25 MG T: 25 | 30 days supply | Qty: 30 | Fill #0

## 2017-07-09 MED FILL — AMLODIPINE BESYLATE 10 MG T: 10 | 30 days supply | Qty: 30 | Fill #0

## 2017-07-09 MED FILL — METFORMIN HCL ER 500 MG TAB: 500 | 30 days supply | Qty: 30 | Fill #0

## 2017-07-25 ENCOUNTER — Encounter: Payer: Self-pay | Admitting: Gastroenterology

## 2017-07-25 ENCOUNTER — Ambulatory Visit (AMBULATORY_SURGERY_CENTER): Payer: Medicaid Other | Admitting: Gastroenterology

## 2017-07-25 ENCOUNTER — Other Ambulatory Visit: Payer: Self-pay

## 2017-07-25 VITALS — BP 120/75 | HR 90 | Temp 99.1°F | Resp 13 | Ht 74.0 in | Wt 316.0 lb

## 2017-07-25 DIAGNOSIS — D123 Benign neoplasm of transverse colon: Secondary | ICD-10-CM

## 2017-07-25 DIAGNOSIS — Z1211 Encounter for screening for malignant neoplasm of colon: Secondary | ICD-10-CM

## 2017-07-25 MED ORDER — SODIUM CHLORIDE 0.9 % IV SOLN: 500.0000 mL | Freq: Once | INTRAVENOUS | Status: DC

## 2017-07-25 NOTE — Progress Notes (Signed)
To pacu, Vss. Report to RN.tb

## 2017-07-25 NOTE — Patient Instructions (Signed)
THANK YOU FOR ALLOWING [place] TO CARE FOR YOU TODAY!  HANDOUTS GIVEN FOR POLYPS AND DIVERTICULOSIS  AWAIT PATHOLOGY RESULTS  RESUME DIET AND CURRENT MEDICATIONS   YOU HAD AN ENDOSCOPIC PROCEDURE TODAY AT [facility]:   Refer to the procedure report that was given to you for any specific questions about what was found during the examination.  If the procedure report does not answer your questions, please call your gastroenterologist to clarify.  If you requested that your care partner not be given the details of your procedure findings, then the procedure report has been included in a sealed envelope for you to review at your convenience later.  YOU SHOULD EXPECT: Some feelings of bloating in the abdomen. Passage of more gas than usual.  Walking can help get rid of the air that was put into your GI tract during the procedure and reduce the bloating. If you had a lower endoscopy (such as a colonoscopy or flexible sigmoidoscopy) you may notice spotting of blood in your stool or on the toilet paper. If you underwent a bowel prep for your procedure, you may not have a normal bowel movement for a few days.  Please Note:  You might notice some irritation and congestion in your nose or some drainage.  This is from the oxygen used during your procedure.  There is no need for concern and it should clear up in a day or so.  SYMPTOMS TO REPORT IMMEDIATELY:   Following lower endoscopy (colonoscopy or flexible sigmoidoscopy):  Excessive amounts of blood in the stool  Significant tenderness or worsening of abdominal pains  Swelling of the abdomen that is new, acute  Fever of 100F or higher    For urgent or emergent issues, a gastroenterologist can be reached at any hour by calling [phone].   DIET:  We do recommend a small meal at first, but then you may proceed to your regular diet.  Drink plenty of fluids but you should avoid alcoholic beverages for 24 hours.  ACTIVITY:  You  should plan to take it easy for the rest of today and you should NOT DRIVE or use heavy machinery until tomorrow (because of the sedation medicines used during the test).    FOLLOW UP: Our staff will call the number listed on your records the next business day following your procedure to check on you and address any questions or concerns that you may have regarding the information given to you following your procedure. If we do not reach you, we will leave a message.  However, if you are feeling well and you are not experiencing any problems, there is no need to return our call.  We will assume that you have returned to your regular daily activities without incident.  If any biopsies were taken you will be contacted by phone or by letter within the next 1-3 weeks.  Please call us at [phone] if you have not heard about the biopsies in 3 weeks.    SIGNATURES/CONFIDENTIALITY: You and/or your care partner have signed paperwork which will be entered into your electronic medical record.  These signatures attest to the fact that that the information above on your After Visit Summary has been reviewed and is understood.  Full responsibility of the confidentiality of this discharge information lies with you and/or your care-partner.

## 2017-07-25 NOTE — Progress Notes (Signed)
No changes in medical or surgical hx since PV per pt 

## 2017-07-25 NOTE — Progress Notes (Signed)
Called to room to assist during endoscopic procedure.  Patient ID and intended procedure confirmed with present staff. Received instructions for my participation in the procedure from the performing physician.  

## 2017-07-25 NOTE — Op Note (Signed)
[facility] Patient Name: Seth Weaver Procedure Date: 07/25/2017 11:08 AM MRN: [id number] Endoscopist: Mallie Mussel L. Loletha Carrow , MD Age: 57 Referring MD:  Date of Birth: Jul 24, 1960 Gender: Male Account #: [id number] Procedure:                Colonoscopy Indications:              Screening for colorectal malignant neoplasm, This                            is the patient's first colonoscopy Medicines:                Monitored Anesthesia Care Procedure:                Pre-Anesthesia Assessment:                           - Prior to the procedure, a History and Physical                            was performed, and patient medications and                            allergies were reviewed. The patient's tolerance of                            previous anesthesia was also reviewed. The risks                            and benefits of the procedure and the sedation                            options and risks were discussed with the patient.                            All questions were answered, and informed consent                            was obtained. Prior Anticoagulants: The patient has                            taken no previous anticoagulant or antiplatelet                            agents. ASA Grade Assessment: III - A patient with                            severe systemic disease. After reviewing the risks                            and benefits, the patient was deemed in                            satisfactory condition to undergo the procedure.  After obtaining informed consent, the colonoscope                            was passed under direct vision. Throughout the                            procedure, the patient's blood pressure, pulse, and                            oxygen saturations were monitored continuously. The                            Colonoscope was introduced through the anus and                            advanced to the the cecum,  identified by                            appendiceal orifice and ileocecal valve. The                            colonoscopy was performed with difficulty due to a                            redundant colon and the patient's body habitus. The                            patient tolerated the procedure well. The quality                            of the bowel preparation was good. The ileocecal                            valve, appendiceal orifice, and rectum were                            photographed. The quality of the bowel preparation                            was evaluated using the BBPS [facility] Bowel                            Preparation Scale) with scores of: Right Colon = 2,                            Transverse Colon = 2 and Left Colon = 2. The total                            BBPS score equals 6. , after lavage The bowel                            preparation used was SUPREP. Scope In: 11:15:52 AM Scope Out: 11:38:08 AM Scope Withdrawal  Time: 0 hours 15 minutes 15 seconds  Total Procedure Duration: 0 hours 22 minutes 16 seconds  Findings:                 The perianal and digital rectal examinations were                            normal.                           Multiple diverticula were found in the left colon.                           A 10 mm polyp was found in the hepatic flexure. The                            polyp was sessile. The polyp was removed en bloc                            with a hot snare, then cut into pieces with snare                            for retrieval. Resection and retrieval were                            complete.                           A 6 mm polyp was found in the splenic flexure. The                            polyp was sessile. The polyp was removed with a                            cold snare. Resection and retrieval were complete.                           The exam was otherwise without abnormality on                            direct  and retroflexion views. Complications:            No immediate complications. Estimated Blood Loss:     Estimated blood loss was minimal. Impression:               - Diverticulosis in the left colon.                           - One 10 mm polyp at the hepatic flexure, removed                            with a hot snare. Resected and retrieved.                           - One 6 mm polyp at the splenic flexure,  removed                            with a cold snare. Resected and retrieved.                           - The examination was otherwise normal on direct                            and retroflexion views. Recommendation:           - Patient has a contact number available for                            emergencies. The signs and symptoms of potential                            delayed complications were discussed with the                            patient. Return to normal activities tomorrow.                            Written discharge instructions were provided to the                            patient.                           - Resume previous diet.                           - Continue present medications.                           - Await pathology results.                           - Repeat colonoscopy is recommended for                            surveillance. The colonoscopy date will be                            determined after pathology results from today's                            exam become available for review. Henry L. Loletha Carrow, MD 07/25/2017 11:44:09 AM This report has been signed electronically.

## 2017-07-28 ENCOUNTER — Telehealth: Payer: Self-pay | Admitting: *Deleted

## 2017-07-28 NOTE — Telephone Encounter (Signed)
No answer for second post procedure call back. Left message for patient to call back with questions or concerns. SM 

## 2017-07-28 NOTE — Telephone Encounter (Signed)
No answer for post procedure call back. Will attempt to call back later this afternoon. SM 

## 2017-07-29 ENCOUNTER — Encounter: Payer: Self-pay | Admitting: General Practice

## 2017-08-01 ENCOUNTER — Encounter: Payer: Self-pay | Admitting: Gastroenterology

## 2017-08-27 ENCOUNTER — Ambulatory Visit: Payer: Self-pay | Admitting: Surgery

## 2017-09-19 ENCOUNTER — Telehealth: Payer: Self-pay | Admitting: Surgery

## 2017-09-19 ENCOUNTER — Ambulatory Visit: Payer: Self-pay | Admitting: Surgery

## 2017-09-19 MED FILL — PRAVASTATIN SODIUM 20 MG TA: 20 | 30 days supply | Qty: 30 | Fill #1

## 2017-09-19 MED FILL — METFORMIN HCL ER 500 MG TAB: 500 | 30 days supply | Qty: 30 | Fill #1

## 2017-09-19 MED FILL — LOSARTAN POTASSIUM 100 MG T: 100 | 30 days supply | Qty: 30 | Fill #1

## 2017-09-19 MED FILL — AMLODIPINE BESYLATE 10 MG T: 10 | 30 days supply | Qty: 30 | Fill #1

## 2017-09-19 MED FILL — HYDROCHLOROTHIAZIDE 25 MG T: 25 | 30 days supply | Qty: 30 | Fill #1

## 2017-09-19 NOTE — Telephone Encounter (Signed)
I CALLED PATIENT & L/M TO CALL & R/S APPT FROM 09-19-17.

## 2017-11-27 MED FILL — LOSARTAN POTASSIUM 100 MG T: 100 | 30 days supply | Qty: 30 | Fill #2

## 2017-11-27 MED FILL — AMLODIPINE BESYLATE 10 MG T: 10 | 30 days supply | Qty: 30 | Fill #2

## 2017-11-27 MED FILL — METFORMIN HCL ER 500 MG TAB: 500 | 30 days supply | Qty: 30 | Fill #2

## 2017-11-27 MED FILL — PRAVASTATIN SODIUM 20 MG TA: 20 | 30 days supply | Qty: 30 | Fill #2

## 2017-11-27 MED FILL — HYDROCHLOROTHIAZIDE 25 MG T: 25 | 30 days supply | Qty: 30 | Fill #2

## 2018-01-27 MED FILL — LOSARTAN POTASSIUM 100 MG T: 100 | 30 days supply | Qty: 30 | Fill #3

## 2018-01-27 MED FILL — METFORMIN HCL ER 500 MG TAB: 500 | 30 days supply | Qty: 30 | Fill #3

## 2018-01-27 MED FILL — HYDROCHLOROTHIAZIDE 25 MG T: 25 | 30 days supply | Qty: 30 | Fill #3

## 2018-01-27 MED FILL — AMLODIPINE BESYLATE 10 MG T: 10 | 30 days supply | Qty: 30 | Fill #3

## 2018-01-27 MED FILL — PRAVASTATIN SODIUM 20 MG TA: 20 | 30 days supply | Qty: 30 | Fill #3

## 2018-02-03 ENCOUNTER — Ambulatory Visit: Payer: Medicaid Other | Attending: Family Medicine | Admitting: Family Medicine

## 2018-02-03 ENCOUNTER — Encounter: Payer: Self-pay | Admitting: Family Medicine

## 2018-02-03 VITALS — BP 135/87 | HR 92 | Temp 98.2°F | Ht 74.0 in | Wt 308.4 lb

## 2018-02-03 DIAGNOSIS — E785 Hyperlipidemia, unspecified: Secondary | ICD-10-CM | POA: Insufficient documentation

## 2018-02-03 DIAGNOSIS — I10 Essential (primary) hypertension: Secondary | ICD-10-CM | POA: Insufficient documentation

## 2018-02-03 DIAGNOSIS — Z7984 Long term (current) use of oral hypoglycemic drugs: Secondary | ICD-10-CM | POA: Diagnosis not present

## 2018-02-03 DIAGNOSIS — L84 Corns and callosities: Secondary | ICD-10-CM | POA: Diagnosis not present

## 2018-02-03 DIAGNOSIS — Z6839 Body mass index (BMI) 39.0-39.9, adult: Secondary | ICD-10-CM | POA: Diagnosis not present

## 2018-02-03 DIAGNOSIS — Z79899 Other long term (current) drug therapy: Secondary | ICD-10-CM | POA: Diagnosis not present

## 2018-02-03 DIAGNOSIS — E6609 Other obesity due to excess calories: Secondary | ICD-10-CM | POA: Diagnosis not present

## 2018-02-03 DIAGNOSIS — E119 Type 2 diabetes mellitus without complications: Secondary | ICD-10-CM

## 2018-02-03 DIAGNOSIS — E1169 Type 2 diabetes mellitus with other specified complication: Secondary | ICD-10-CM | POA: Insufficient documentation

## 2018-02-03 LAB — POCT GLYCOSYLATED HEMOGLOBIN (HGB A1C): HbA1c, POC (controlled diabetic range): 5.8 % (ref 0.0–7.0)

## 2018-02-03 LAB — GLUCOSE, POCT (MANUAL RESULT ENTRY): POC Glucose: 87 mg/dl (ref 70–99)

## 2018-02-03 MED ORDER — METFORMIN HCL ER 500 MG PO TB24: 500.0000 mg | ORAL_TABLET | Freq: Every day | ORAL | 1 refills | Status: DC

## 2018-02-03 MED ORDER — LOSARTAN POTASSIUM 100 MG PO TABS: 100.0000 mg | ORAL_TABLET | Freq: Every day | ORAL | 1 refills | Status: DC

## 2018-02-03 MED ORDER — HYDROCHLOROTHIAZIDE 25 MG PO TABS: 25.0000 mg | ORAL_TABLET | Freq: Every day | ORAL | 1 refills | Status: DC

## 2018-02-03 MED ORDER — AMLODIPINE BESYLATE 10 MG PO TABS: 10.0000 mg | ORAL_TABLET | Freq: Every day | ORAL | 1 refills | Status: DC

## 2018-02-03 MED ORDER — PRAVASTATIN SODIUM 20 MG PO TABS: 20.0000 mg | ORAL_TABLET | Freq: Every day | ORAL | 1 refills | Status: DC

## 2018-02-03 NOTE — Progress Notes (Signed)
Subjective:  Patient ID: Seth Weaver, male    DOB: 1960-05-21  Age: 57 y.o. MRN: [id number]  CC: Diabetes   HPI Seth Weaver  is a 57 year old male with a history of type 2 diabetes mellitus (A1c 5.8), hypertension, hyperlipidemia who presents for follow-up visit.  His A1c is 5.8 which is down from 6.1 and he denies hypoglycemia, numbness in extremities or visual concerns.  He has been compliant with his medications but does not exercise regularly.  He has had an eye exam in the last one year but is unable to recall the name of his ophthalmologist. Tolerating his antihypertensive and statin with no complaints of myalgias. He has no additional concerns today.  Past Medical History:  Diagnosis Date  . Diabetes mellitus without complication ([place])   . Essential hypertension 02/22/2014  . Hyperlipidemia   . Hypertension   . Inguinal hernia 10/07/2014    Past Surgical History:  Procedure Laterality Date  . HERNIA REPAIR      No Known Allergies   Outpatient Medications Prior to Visit  Medication Sig Dispense Refill  . Blood Glucose Monitoring Suppl (BLOOD GLUCOSE METER KIT AND SUPPLIES) Dispense based on patient and insurance preference. Test blood sugar once daily as directed. (FOR ICD-9 250.00, 250.01). 1 each 0  . glucose blood test strip Use as instructed 100 each 12  . amLODipine (NORVASC) 10 MG tablet Take 1 tablet (10 mg total) by mouth daily. 90 tablet 1  . hydrochlorothiazide (HYDRODIURIL) 25 MG tablet Take 1 tablet (25 mg total) by mouth daily. 90 tablet 1  . losartan (COZAAR) 100 MG tablet Take 1 tablet (100 mg total) by mouth daily. 90 tablet 1  . metFORMIN (GLUCOPHAGE-XR) 500 MG 24 hr tablet Take 1 tablet (500 mg total) by mouth daily with breakfast. 90 tablet 1  . pravastatin (PRAVACHOL) 20 MG tablet Take 1 tablet (20 mg total) by mouth daily. 90 tablet 1   Facility-Administered Medications Prior to Visit  Medication Dose Route Frequency Provider Last Rate Last Dose    . 0.9 %  sodium chloride infusion  500 mL Intravenous Once Nelida Meuse III, MD        ROS Review of Systems  Constitutional: Negative for activity change and appetite change.  HENT: Negative for sinus pressure and sore throat.   Eyes: Negative for visual disturbance.  Respiratory: Negative for cough, chest tightness and shortness of breath.   Cardiovascular: Negative for chest pain and leg swelling.  Gastrointestinal: Negative for abdominal distention, abdominal pain, constipation and diarrhea.  Endocrine: Negative.   Genitourinary: Negative for dysuria.  Musculoskeletal: Negative for joint swelling and myalgias.  Skin: Negative for rash.  Allergic/Immunologic: Negative.   Neurological: Negative for weakness, light-headedness and numbness.  Psychiatric/Behavioral: Negative for dysphoric mood and suicidal ideas.    Objective:  BP 135/87   Pulse 92   Temp 98.2 F (36.8 C) (Oral)   Ht 6' 2" (1.88 m)   Wt (!) 308 lb 6.4 oz (139.9 kg)   SpO2 94%   BMI 39.60 kg/m   BP/Weight 02/03/2018 07/25/2017 1/94/1740  Systolic BP 814 481 -  Diastolic BP 87 75 -  Wt. (Lbs) 308.4 316 316.8  BMI 39.6 40.57 40.67      Physical Exam  Constitutional: He is oriented to person, place, and time. He appears well-developed and well-nourished.  Obese  Cardiovascular: Normal rate, normal heart sounds and intact distal pulses.  No murmur heard. Pulmonary/Chest: Effort normal and breath sounds normal. He  has no wheezes. He has no rales. He exhibits no tenderness.  Abdominal: Soft. Bowel sounds are normal. He exhibits no distension and no mass. There is no tenderness.  Musculoskeletal: Normal range of motion.  Neurological: He is alert and oriented to person, place, and time.  Skin: Skin is warm and dry.  Psychiatric: He has a normal mood and affect.     CMP Latest Ref Rng & Units 06/25/2017 05/27/2016 05/08/2015  Glucose 65 - 99 mg/dL 98 104(H) 107(H)  BUN 6 - 24 mg/dL _0 Creatinine 0.76  - 1.27 mg/dL 1.11 1.20 0.99  Sodium 134 - 144 mmol/L 139 137 140  Potassium 3.5 - 5.2 mmol/L 4.3 4.5 4.0  Chloride 96 - 106 mmol/L 99 106 102  CO2 20 - 29 mmol/L 25 26 32(H)  Calcium 8.7 - 10.2 mg/dL 9.5 8.5(L) 8.9  Total Protein 6.0 - 8.5 g/dL 7.0 - -  Total Bilirubin 0.0 - 1.2 mg/dL 0.4 - -  Alkaline Phos 39 - 117 IU/L 68 - -  AST 0 - 40 IU/L 18 - -  ALT 0 - 44 IU/L 19 - -    Lipid Panel     Component Value Date/Time   CHOL 164 10/01/2016 0914   TRIG 99 10/01/2016 0914   HDL 42 10/01/2016 0914   CHOLHDL 3.9 10/01/2016 0914   CHOLHDL 4.4 01/07/2014 0921   VLDL 21 01/07/2014 0921   LDLCALC 102 (H) 10/01/2016 0914    Lab Results  Component Value Date   HGBA1C 5.8 02/03/2018     Assessment & Plan:   1. Controlled type 2 diabetes mellitus without complication, without long-term current use of insulin (HCC) Controlled with A1c of 5.8 Counseled on Diabetic diet, my plate method, 423 minutes of moderate intensity exercise/week Keep blood sugar logs with fasting goals of 80-120 mg/dl, random of less than 180 and in the event of sugars less than 60 mg/dl or greater than 400 mg/dl please notify the clinic ASAP. It is recommended that you undergo annual eye exams and annual foot exams. Pneumonia vaccine is recommended. - POCT glucose (manual entry) - POCT glycosylated hemoglobin (Hb A1C) - Lipid panel; Future - Basic Metabolic Panel; Future - metFORMIN (GLUCOPHAGE-XR) 500 MG 24 hr tablet; Take 1 tablet (500 mg total) by mouth daily with breakfast.  Dispense: 90 tablet; Refill: 1 - Ambulatory referral to Ophthalmology - Ambulatory referral to Podiatry  2. Essential hypertension Controlled Counseled on blood pressure goal of less than 130/80, low-sodium, DASH diet, medication compliance, 150 minutes of moderate intensity exercise per week. Discussed medication compliance, adverse effects. - amLODipine (NORVASC) 10 MG tablet; Take 1 tablet (10 mg total) by mouth daily.   Dispense: 90 tablet; Refill: 1 - hydrochlorothiazide (HYDRODIURIL) 25 MG tablet; Take 1 tablet (25 mg total) by mouth daily.  Dispense: 90 tablet; Refill: 1 - losartan (COZAAR) 100 MG tablet; Take 1 tablet (100 mg total) by mouth daily.  Dispense: 90 tablet; Refill: 1  3. Hyperlipidemia associated with type 2 diabetes mellitus (HCC) Controlled Low-cholesterol diet - pravastatin (PRAVACHOL) 20 MG tablet; Take 1 tablet (20 mg total) by mouth daily.  Dispense: 90 tablet; Refill: 1  4. Callus of foot - Ambulatory referral to Podiatry  5. Class 2 obesity due to excess calories without serious comorbidity with body mass index (BMI) of 39.0 to 39.9 in adult Discussed reducing portion sizes, increasing physical activity   Meds ordered this encounter  Medications  . amLODipine (NORVASC) 10 MG tablet  Sig: Take 1 tablet (10 mg total) by mouth daily.    Dispense:  90 tablet    Refill:  1  . hydrochlorothiazide (HYDRODIURIL) 25 MG tablet    Sig: Take 1 tablet (25 mg total) by mouth daily.    Dispense:  90 tablet    Refill:  1  . losartan (COZAAR) 100 MG tablet    Sig: Take 1 tablet (100 mg total) by mouth daily.    Dispense:  90 tablet    Refill:  1  . metFORMIN (GLUCOPHAGE-XR) 500 MG 24 hr tablet    Sig: Take 1 tablet (500 mg total) by mouth daily with breakfast.    Dispense:  90 tablet    Refill:  1  . pravastatin (PRAVACHOL) 20 MG tablet    Sig: Take 1 tablet (20 mg total) by mouth daily.    Dispense:  90 tablet    Refill:  1    Follow-up: Return in about 6 months (around 08/04/2018) for follow up of chronic medical conditions.   Charlott Rakes MD

## 2018-02-03 NOTE — Patient Instructions (Signed)

## 2018-02-04 ENCOUNTER — Ambulatory Visit: Payer: Medicaid Other | Attending: Family Medicine

## 2018-02-04 DIAGNOSIS — E119 Type 2 diabetes mellitus without complications: Secondary | ICD-10-CM

## 2018-02-04 NOTE — Progress Notes (Signed)
Patient here for lab visit only 

## 2018-02-05 ENCOUNTER — Other Ambulatory Visit: Payer: Self-pay | Admitting: Family Medicine

## 2018-02-05 LAB — LIPID PANEL
CHOL/HDL RATIO: 3.9 ratio (ref 0.0–5.0)
CHOLESTEROL TOTAL: 179 mg/dL (ref 100–199)
HDL: 46 mg/dL (ref >39)
LDL Calculated: 118 mg/dL — ABNORMAL HIGH (ref 0–99)
TRIGLYCERIDES: 75 mg/dL (ref 0–149)
VLDL Cholesterol Cal: 15 mg/dL (ref 5–40)

## 2018-02-05 LAB — BASIC METABOLIC PANEL
BUN / CREAT RATIO: 12 (ref 9–20)
BUN: 16 mg/dL (ref 6–24)
CO2: 27 mmol/L (ref 20–29)
Calcium: 9.2 mg/dL (ref 8.7–10.2)
Chloride: 98 mmol/L (ref 96–106)
Creatinine, Ser: 1.29 mg/dL — ABNORMAL HIGH (ref 0.76–1.27)
GFR calc Af Amer: 71 mL/min/{1.73_m2} (ref >59)
GFR calc non Af Amer: 61 mL/min/{1.73_m2} (ref >59)
GLUCOSE: 91 mg/dL (ref 65–99)
POTASSIUM: 4.5 mmol/L (ref 3.5–5.2)
SODIUM: 140 mmol/L (ref 134–144)

## 2018-02-05 MED ORDER — ATORVASTATIN CALCIUM 20 MG PO TABS: 20.0000 mg | ORAL_TABLET | Freq: Every day | ORAL | 1 refills | Status: DC

## 2018-02-06 ENCOUNTER — Telehealth: Payer: Self-pay

## 2018-02-06 NOTE — Telephone Encounter (Signed)
Patient was called and informed to contact office for lab results.   If patient returns phone call please inform him that his cholesterol is elevated and medication has been changed to Lipitor.

## 2018-02-06 NOTE — Telephone Encounter (Signed)
-----   Message from Charlott Rakes, MD sent at 02/05/2018  6:32 AM EST ----- Bad cholesterol (LDL is elevated. I have changed his Pravastatin to Lipitor for better control of his cholesterol and he is to work a low cholesterol diet, exercise and weight loss. Other labs are stable

## 2018-03-02 MED FILL — PRAVASTATIN SODIUM 20 MG TA: 20 | 30 days supply | Qty: 30 | Fill #4

## 2018-03-02 MED FILL — METFORMIN HCL ER 500 MG TAB: 500 | 30 days supply | Qty: 30 | Fill #4

## 2018-03-02 MED FILL — HYDROCHLOROTHIAZIDE 25 MG T: 25 | 30 days supply | Qty: 30 | Fill #4

## 2018-03-02 MED FILL — AMLODIPINE BESYLATE 10 MG T: 10 | 30 days supply | Qty: 30 | Fill #4

## 2018-03-02 MED FILL — LOSARTAN POTASSIUM 100 MG T: 100 | 30 days supply | Qty: 30 | Fill #4

## 2018-03-15 ENCOUNTER — Encounter (HOSPITAL_COMMUNITY): Payer: Self-pay | Admitting: Emergency Medicine

## 2018-03-15 ENCOUNTER — Emergency Department (HOSPITAL_COMMUNITY)
Admission: EM | Admit: 2018-03-15 | Discharge: 2018-03-15 | Disposition: A | Discharge status: Home / Self Care | Payer: Medicaid Other | Attending: Emergency Medicine | Admitting: Emergency Medicine

## 2018-03-15 DIAGNOSIS — Z7984 Long term (current) use of oral hypoglycemic drugs: Secondary | ICD-10-CM | POA: Insufficient documentation

## 2018-03-15 DIAGNOSIS — E119 Type 2 diabetes mellitus without complications: Secondary | ICD-10-CM | POA: Diagnosis not present

## 2018-03-15 DIAGNOSIS — L03011 Cellulitis of right finger: Secondary | ICD-10-CM | POA: Diagnosis not present

## 2018-03-15 DIAGNOSIS — I10 Essential (primary) hypertension: Secondary | ICD-10-CM | POA: Diagnosis not present

## 2018-03-15 DIAGNOSIS — M79641 Pain in right hand: Secondary | ICD-10-CM | POA: Diagnosis present

## 2018-03-15 MED ORDER — SULFAMETHOXAZOLE-TRIMETHOPRIM 800-160 MG PO TABS
1.0000 | ORAL_TABLET | Freq: Once | ORAL | Status: AC
  Administered 2018-03-15: 1 via ORAL
  Filled 2018-03-15: qty 1

## 2018-03-15 MED ORDER — SULFAMETHOXAZOLE-TRIMETHOPRIM 800-160 MG PO TABS: 1.0000 | ORAL_TABLET | Freq: Two times a day (BID) | ORAL | 0 refills | Status: AC

## 2018-03-15 MED ORDER — LIDOCAINE HCL (PF) 1 % IJ SOLN
5.0000 mL | Freq: Once | INTRAMUSCULAR | Status: AC
  Administered 2018-03-15: 5 mL
  Filled 2018-03-15: qty 5

## 2018-03-15 NOTE — ED Notes (Signed)
Discharge instructions and prescription discussed with Pt. Pt verbalized understanding. Pt stable and ambulatory.    

## 2018-03-15 NOTE — ED Provider Notes (Signed)
[facility] Provider Note   CSN: [id number] Arrival date & time: 03/15/18  1857     History   Chief Complaint Chief Complaint  Patient presents with  . Hand Pain    HPI Seth Weaver is a 57 y.o. male.  57 year old male with history of diabetes presents with complaint of pain and swelling to the distal right index finger x3 days.  Patient states that he pulled a small piece of skin off of a hangnail before his symptoms started.  Patient does not have any pain with flexion or extension of the finger, no active drainage from the finger.  No other complaints or concerns.     Past Medical History:  Diagnosis Date  . Diabetes mellitus without complication ([place])   . Essential hypertension 02/22/2014  . Hyperlipidemia   . Hypertension   . Inguinal hernia 10/07/2014    Patient Active Problem List   Diagnosis Date Noted  . S/P inguinal hernia repair, follow-up exam 01/04/2015  . Bilateral inguinal hernia without obstruction or gangrene 11/07/2014  . Obese 11/07/2014  . Inguinal hernia 10/07/2014  . Essential hypertension 02/22/2014  . DM type 2 (diabetes mellitus, type 2) ([place]) 02/22/2014    Past Surgical History:  Procedure Laterality Date  . HERNIA REPAIR          Home Medications    Prior to Admission medications   Medication Sig Start Date End Date Taking? Authorizing Provider  amLODipine (NORVASC) 10 MG tablet Take 1 tablet (10 mg total) by mouth daily. 02/03/18   Charlott Rakes, MD  atorvastatin (LIPITOR) 20 MG tablet Take 1 tablet (20 mg total) by mouth daily. 02/05/18   Charlott Rakes, MD  Blood Glucose Monitoring Suppl (BLOOD GLUCOSE METER KIT AND SUPPLIES) Dispense based on patient and insurance preference. Test blood sugar once daily as directed. (FOR ICD-9 250.00, 250.01). 01/18/14   Chari Manning A, NP  glucose blood test strip Use as instructed 10/01/16   Alfonse Spruce, FNP  hydrochlorothiazide (HYDRODIURIL)  25 MG tablet Take 1 tablet (25 mg total) by mouth daily. 02/03/18   Charlott Rakes, MD  losartan (COZAAR) 100 MG tablet Take 1 tablet (100 mg total) by mouth daily. 02/03/18   Charlott Rakes, MD  metFORMIN (GLUCOPHAGE-XR) 500 MG 24 hr tablet Take 1 tablet (500 mg total) by mouth daily with breakfast. 02/03/18   Charlott Rakes, MD  pravastatin (PRAVACHOL) 20 MG tablet Take 1 tablet (20 mg total) by mouth daily. 02/03/18   Charlott Rakes, MD  sulfamethoxazole-trimethoprim (BACTRIM DS,SEPTRA DS) 800-160 MG tablet Take 1 tablet by mouth 2 (two) times daily for 10 days. 03/15/18 03/25/18  Tacy Learn, PA-C    Family History Family History  Problem Relation Age of Onset  . Diabetes Father   . Hypertension Father   . Colon cancer Paternal Uncle   . Colon polyps Neg Hx   . Esophageal cancer Neg Hx   . Rectal cancer Neg Hx   . Stomach cancer Neg Hx     Social History Social History   Tobacco Use  . Smoking status: Never Smoker  . Smokeless tobacco: Never Used  Substance Use Topics  . Alcohol use: No    Comment: Pt states he used to drink but quit approximately 2 months ago.   . Drug use: No     Allergies   Patient has no known allergies.   Review of Systems Review of Systems  Constitutional: Negative for fever.  Musculoskeletal: Positive  for joint swelling.  Skin: Positive for color change. Negative for wound.  Allergic/Immunologic: Positive for immunocompromised state.  Hematological: Negative for adenopathy. Does not bruise/bleed easily.  Psychiatric/Behavioral: Negative for confusion.  All other systems reviewed and are negative.    Physical Exam Updated Vital Signs BP (!) 130/94   Pulse (!) 108   Temp 98 F (36.7 C) (Oral)   Resp 16   Ht '6\' 2"'$  (1.88 m)   Wt 126.1 kg   SpO2 100%   BMI 35.69 kg/m   Physical Exam Vitals signs and nursing note reviewed.  Constitutional:      General: He is not in acute distress.    Appearance: He is well-developed. He is  not diaphoretic.  HENT:     Head: Normocephalic and atraumatic.  Pulmonary:     Effort: Pulmonary effort is normal.  Musculoskeletal:        General: Swelling and tenderness present. No deformity.       Hands:  Skin:    General: Skin is warm and dry.     Findings: Erythema present.  Neurological:     General: No focal deficit present.     Mental Status: He is alert and oriented to person, place, and time.  Psychiatric:        Behavior: Behavior normal.      ED Treatments / Results  Labs (all labs ordered are listed, but only abnormal results are displayed) Labs Reviewed - No data to display  EKG None  Radiology No results found.  Procedures .Marland KitchenIncision and Drainage Date/Time: 03/15/2018 8:27 PM Performed by: Tacy Learn, PA-C Authorized by: Tacy Learn, PA-C   Consent:    Consent obtained:  Verbal   Consent given by:  Patient   Risks discussed:  Bleeding, incomplete drainage, infection and pain   Alternatives discussed:  No treatment Location:    Type:  Abscess   Size:  1cm   Location:  Upper extremity   Upper extremity location:  Finger   Finger location:  R index finger Pre-procedure details:    Skin preparation:  Betadine Anesthesia (see MAR for exact dosages):    Anesthesia method:  Local infiltration   Local anesthetic:  Lidocaine 1% w/o epi (digital block) Procedure type:    Complexity:  Simple Procedure details:    Incision types:  Single straight   Scalpel blade:  11   Wound management:  Probed and deloculated   Drainage:  Purulent   Drainage amount:  Moderate   Wound treatment:  Wound left open   Packing materials:  None Post-procedure details:    Patient tolerance of procedure:  Tolerated well, no immediate complications   (including critical care time)  Medications Ordered in ED Medications  sulfamethoxazole-trimethoprim (BACTRIM DS,SEPTRA DS) 800-160 MG per tablet 1 tablet (has no administration in time range)  lidocaine (PF)  (XYLOCAINE) 1 % injection 5 mL (5 mLs Infiltration Given 03/15/18 2015)     Initial Impression / Assessment and Plan / ED Course  I have reviewed the triage vital signs and the nursing notes.  Pertinent labs & imaging results that were available during my care of the patient were reviewed by me and considered in my medical decision making (see chart for details).  Clinical Course as of Mar 15 2028  Nancy Fetter Mar 15, 3366  [id number] 57 year old male with right index finger paronychia treated with incision and drainage today.  Patient given first dose of Septra in the ER, discharged with prescription for  same.  Recommend patient recheck with his PCP in the next 2 days, if unable to see PCP he was given referral to see hand specialist.  Patient is advised return to the ER for any worsening or concerning symptoms especially pain with movement of his finger or redness or swelling extending up the finger.   [LM]    Clinical Course User Index [LM] Tacy Learn, PA-C   Final Clinical Impressions(s) / ED Diagnoses   Final diagnoses:  Perionychia of finger, right    ED Discharge Orders         Ordered    sulfamethoxazole-trimethoprim (BACTRIM DS,SEPTRA DS) 800-160 MG tablet  2 times daily     03/15/18 2025           Roque Lias 03/15/18 2029    Daleen Bo, MD 03/15/18 2252

## 2018-03-15 NOTE — Discharge Instructions (Addendum)
Take antibiotics as prescribed and complete the full course.  You may also soak your finger in warm Epson salt water for 20 minutes at a time. Return to the ER for worsening or concerning symptoms otherwise recommend recheck with your doctor or see the hand specialist in the next 2 days.

## 2018-03-15 NOTE — ED Triage Notes (Signed)
Pt reports he thinks he pulled an hang nail off of the right pointer finger, is having pain.  Swelling noted and hot to the touch.

## 2018-03-16 MED FILL — SULFAMETHOXAZOLE-TMP DS TAB: 800-160 | 10 days supply | Qty: 20 | Fill #0

## 2018-04-08 MED FILL — AMLODIPINE BESYLATE 10 MG T: 10 | 30 days supply | Qty: 30 | Fill #5

## 2018-04-08 MED FILL — HYDROCHLOROTHIAZIDE 25 MG T: 25 | 30 days supply | Qty: 30 | Fill #5

## 2018-04-08 MED FILL — LOSARTAN POTASSIUM 100 MG T: 100 | 30 days supply | Qty: 30 | Fill #5

## 2018-04-08 MED FILL — PRAVASTATIN SODIUM 20 MG TA: 20 | 30 days supply | Qty: 30 | Fill #5

## 2018-04-08 MED FILL — METFORMIN HCL ER 500 MG TAB: 500 | 30 days supply | Qty: 30 | Fill #5

## 2018-05-08 MED FILL — LOSARTAN POTASSIUM 100 MG T: 100 | 90 days supply | Qty: 90 | Fill #0

## 2018-05-08 MED FILL — PRAVASTATIN SODIUM 20 MG TA: 20 | 90 days supply | Qty: 90 | Fill #0

## 2018-05-08 MED FILL — METFORMIN HCL ER 500 MG TAB: 500 | 90 days supply | Qty: 90 | Fill #0

## 2018-05-08 MED FILL — AMLODIPINE BESYLATE 10 MG T: 10 | 90 days supply | Qty: 90 | Fill #0

## 2018-05-08 MED FILL — HYDROCHLOROTHIAZIDE 25 MG T: 25 | 90 days supply | Qty: 90 | Fill #0

## 2018-09-30 MED FILL — LOSARTAN POTASSIUM 100 MG T: 100 | 90 days supply | Qty: 90 | Fill #1

## 2018-09-30 MED FILL — METFORMIN HCL ER 500 MG TB2: 500 | 90 days supply | Qty: 90 | Fill #1

## 2018-09-30 MED FILL — PRAVASTATIN SODIUM 20 MG TA: 20 | 90 days supply | Qty: 90 | Fill #1

## 2018-09-30 MED FILL — AMLODIPINE BESYLATE 10 MG T: 10 | 90 days supply | Qty: 90 | Fill #1

## 2018-09-30 MED FILL — HYDROCHLOROTHIAZIDE 25 MG T: 25 | 90 days supply | Qty: 90 | Fill #1

## 2018-12-18 DIAGNOSIS — Z20828 Contact with and (suspected) exposure to other viral communicable diseases: Secondary | ICD-10-CM | POA: Diagnosis not present

## 2019-02-21 ENCOUNTER — Ambulatory Visit (HOSPITAL_COMMUNITY)
Admission: EM | Admit: 2019-02-21 | Discharge: 2019-02-21 | Disposition: A | Discharge status: Home / Self Care | Payer: Medicaid Other | Attending: Family Medicine | Admitting: Family Medicine

## 2019-02-21 ENCOUNTER — Other Ambulatory Visit: Payer: Self-pay

## 2019-02-21 ENCOUNTER — Encounter (HOSPITAL_COMMUNITY): Payer: Self-pay | Admitting: Family Medicine

## 2019-02-21 DIAGNOSIS — H00011 Hordeolum externum right upper eyelid: Secondary | ICD-10-CM

## 2019-02-21 DIAGNOSIS — I10 Essential (primary) hypertension: Secondary | ICD-10-CM

## 2019-02-21 MED ORDER — AMLODIPINE BESYLATE 10 MG PO TABS: 10.0000 mg | ORAL_TABLET | Freq: Every day | ORAL | 3 refills | Status: DC

## 2019-02-21 MED ORDER — HYDROCHLOROTHIAZIDE 25 MG PO TABS: 25.0000 mg | ORAL_TABLET | Freq: Every day | ORAL | 3 refills | Status: DC

## 2019-02-21 MED ORDER — TOBRAMYCIN 0.3 % OP SOLN: 1.0000 [drp] | OPHTHALMIC | 0 refills | Status: DC

## 2019-02-21 MED ORDER — DOXYCYCLINE HYCLATE 100 MG PO TABS: 100.0000 mg | ORAL_TABLET | Freq: Two times a day (BID) | ORAL | 0 refills | Status: DC

## 2019-02-21 NOTE — ED Triage Notes (Signed)
Pt presents with right eye redness, pain, and swelling X 2 days.

## 2019-02-21 NOTE — Discharge Instructions (Addendum)
Wash your pillow case  Apply warm moist compresses to the right eye every two hours today  Get back on your blood pressure medication ASAP

## 2019-02-21 NOTE — ED Provider Notes (Signed)
[facility]    CSN: [id number] Arrival date & time: 02/21/19  1012      History   Chief Complaint Chief Complaint  Patient presents with  . Conjunctivitis    HPI Seth Weaver is a 58 y.o. male.   This is a 58-year-old man who presents with redness, swelling, and tenderness on his right eye.  The problem began 2 days ago with watering of the eye and is gradually noticed increasing swelling of the upper lid.  There is been no foreign body sensation or trauma to the eye.    Initial [facility] visit for this 58 yo man with eye swelling, redness, and pain. Past Medical History:  Diagnosis Date  . Diabetes mellitus without complication (HCC)   . Essential hypertension 02/22/2014  . Hyperlipidemia   . Hypertension   . Inguinal hernia 10/07/2014    Patient Active Problem List   Diagnosis Date Noted  . S/P inguinal hernia repair, follow-up exam 01/04/2015  . Bilateral inguinal hernia without obstruction or gangrene 11/07/2014  . Obese 11/07/2014  . Inguinal hernia 10/07/2014  . Essential hypertension 02/22/2014  . DM type 2 (diabetes mellitus, type 2) (HCC) 02/22/2014    Past Surgical History:  Procedure Laterality Date  . HERNIA REPAIR         Home Medications    Prior to Admission medications   Medication Sig Start Date End Date Taking? Authorizing Provider  amLODipine (NORVASC) 10 MG tablet Take 1 tablet (10 mg total) by mouth daily. 02/21/19   Lauenstein, Kurt, MD  atorvastatin (LIPITOR) 20 MG tablet Take 1 tablet (20 mg total) by mouth daily. 02/05/18   Newlin, Enobong, MD  Blood Glucose Monitoring Suppl (BLOOD GLUCOSE METER KIT AND SUPPLIES) Dispense based on patient and insurance preference. Test blood sugar once daily as directed. (FOR ICD-9 250.00, 250.01). 01/18/14   Keck, Valerie A, NP  doxycycline (VIBRA-TABS) 100 MG tablet Take 1 tablet (100 mg total) by mouth 2 (two) times daily. 02/21/19   Lauenstein, Kurt, MD  glucose blood test strip Use as  instructed 10/01/16   Hairston, Mandesia R, FNP  hydrochlorothiazide (HYDRODIURIL) 25 MG tablet Take 1 tablet (25 mg total) by mouth daily. 02/21/19   Lauenstein, Kurt, MD  losartan (COZAAR) 100 MG tablet Take 1 tablet (100 mg total) by mouth daily. 02/03/18   Newlin, Enobong, MD  metFORMIN (GLUCOPHAGE-XR) 500 MG 24 hr tablet Take 1 tablet (500 mg total) by mouth daily with breakfast. 02/03/18   Newlin, Enobong, MD  pravastatin (PRAVACHOL) 20 MG tablet Take 1 tablet (20 mg total) by mouth daily. 02/03/18   Newlin, Enobong, MD  tobramycin (TOBREX) 0.3 % ophthalmic solution Place 1 drop into the right eye every 4 (four) hours. 02/21/19   Lauenstein, Kurt, MD    Family History Family History  Problem Relation Age of Onset  . Diabetes Father   . Hypertension Father   . Colon cancer Paternal Uncle   . Colon polyps Neg Hx   . Esophageal cancer Neg Hx   . Rectal cancer Neg Hx   . Stomach cancer Neg Hx     Social History Social History   Tobacco Use  . Smoking status: Never Smoker  . Smokeless tobacco: Never Used  Substance Use Topics  . Alcohol use: No    Comment: Pt states he used to drink but quit approximately 2 months ago.   . Drug use: No     Allergies   Patient has no known allergies.     Review of Systems Review of Systems  Constitutional: Negative for fever.  Eyes: Positive for pain, discharge and redness.  Respiratory: Negative.   All other systems reviewed and are negative.    Physical Exam Triage Vital Signs ED Triage Vitals  Enc Vitals Group     BP      Pulse      Resp      Temp      Temp src      SpO2      Weight      Height      Head Circumference      Peak Flow      Pain Score      Pain Loc      Pain Edu?      Excl. in GC?    No data found.  Updated Vital Signs BP (!) 181/130 (BP Location: Left Arm)   Pulse 81   Temp 98.1 F (36.7 C) (Oral)   Resp 17   SpO2 97%    Physical Exam Vitals signs and nursing note reviewed.  Constitutional:       Appearance: He is obese.  HENT:     Head: Normocephalic and atraumatic.  Eyes:     Extraocular Movements: Extraocular movements intact.     Pupils: Pupils are equal, round, and reactive to light.     Comments: Both conjunctiva are injected, worse on the right.  There is some moderate edema of the conjunctiva on the right.  The upper lid on the right is swollen and slightly tender and red.  Neck:     Musculoskeletal: Normal range of motion and neck supple.  Pulmonary:     Effort: Pulmonary effort is normal.  Neurological:     Mental Status: He is alert.      UC Treatments / Results  Labs (all labs ordered are listed, but only abnormal results are displayed) Labs Reviewed - No data to display  EKG   Radiology No results found.  Procedures Procedures (including critical care time)  Medications Ordered in UC Medications - No data to display  Initial Impression / Assessment and Plan / UC Course  I have reviewed the triage vital signs and the nursing notes.  Pertinent labs & imaging results that were available during my care of the patient were reviewed by me and considered in my medical decision making (see chart for details).    Final Clinical Impressions(s) / UC Diagnoses   Final diagnoses:  Hordeolum externum of right upper eyelid  Essential hypertension     Discharge Instructions     Wash your pillow case  Apply warm moist compresses to the right eye every two hours today  Get back on your blood pressure medication ASAP    ED Prescriptions    Medication Sig Dispense Auth. Provider   amLODipine (NORVASC) 10 MG tablet Take 1 tablet (10 mg total) by mouth daily. 90 tablet Lauenstein, Kurt, MD   hydrochlorothiazide (HYDRODIURIL) 25 MG tablet Take 1 tablet (25 mg total) by mouth daily. 90 tablet Lauenstein, Kurt, MD   doxycycline (VIBRA-TABS) 100 MG tablet Take 1 tablet (100 mg total) by mouth 2 (two) times daily. 20 tablet Lauenstein, Kurt, MD    tobramycin (TOBREX) 0.3 % ophthalmic solution Place 1 drop into the right eye every 4 (four) hours. 5 mL Lauenstein, Kurt, MD     I have reviewed the PDMP during this encounter.   Lauenstein, Kurt, MD 02/21/19 1044  

## 2019-02-22 ENCOUNTER — Encounter (HOSPITAL_COMMUNITY): Payer: Self-pay | Admitting: Emergency Medicine

## 2019-02-22 ENCOUNTER — Emergency Department (HOSPITAL_COMMUNITY)
Admission: EM | Admit: 2019-02-22 | Discharge: 2019-02-22 | Disposition: A | Discharge status: Home / Self Care | Payer: Medicaid Other | Attending: Emergency Medicine | Admitting: Emergency Medicine

## 2019-02-22 ENCOUNTER — Other Ambulatory Visit: Payer: Self-pay

## 2019-02-22 ENCOUNTER — Emergency Department (HOSPITAL_COMMUNITY): Payer: Medicaid Other

## 2019-02-22 DIAGNOSIS — R220 Localized swelling, mass and lump, head: Secondary | ICD-10-CM | POA: Diagnosis present

## 2019-02-22 DIAGNOSIS — Z79899 Other long term (current) drug therapy: Secondary | ICD-10-CM | POA: Diagnosis not present

## 2019-02-22 DIAGNOSIS — Z7984 Long term (current) use of oral hypoglycemic drugs: Secondary | ICD-10-CM | POA: Diagnosis not present

## 2019-02-22 DIAGNOSIS — L03213 Periorbital cellulitis: Secondary | ICD-10-CM | POA: Diagnosis not present

## 2019-02-22 DIAGNOSIS — E119 Type 2 diabetes mellitus without complications: Secondary | ICD-10-CM | POA: Insufficient documentation

## 2019-02-22 DIAGNOSIS — I10 Essential (primary) hypertension: Secondary | ICD-10-CM | POA: Diagnosis not present

## 2019-02-22 DIAGNOSIS — H02841 Edema of right upper eyelid: Secondary | ICD-10-CM | POA: Diagnosis not present

## 2019-02-22 LAB — CBG MONITORING, ED: Glucose-Capillary: 93 mg/dL (ref 70–99)

## 2019-02-22 MED ORDER — CEPHALEXIN 500 MG PO CAPS: 500.0000 mg | ORAL_CAPSULE | Freq: Four times a day (QID) | ORAL | 0 refills | Status: DC

## 2019-02-22 MED ORDER — LIDOCAINE HCL 1 % IJ SOLN
INTRAMUSCULAR | Status: AC
  Administered 2019-02-22: 1 mL
  Filled 2019-02-22: qty 20

## 2019-02-22 MED ORDER — CEFTRIAXONE SODIUM 1 G IJ SOLR
1.0000 g | Freq: Once | INTRAMUSCULAR | Status: AC
  Administered 2019-02-22: 1 g via INTRAMUSCULAR
  Filled 2019-02-22: qty 10

## 2019-02-22 NOTE — ED Provider Notes (Signed)
[facility] DEPT Provider Note   CSN: [id number] Arrival date & time: 02/22/19  1511     History   Chief Complaint Chief Complaint  Patient presents with  . Facial Swelling    HPI Seth Weaver is a 58 y.o. male past medical history of diabetes on Metformin, hypertension presents to emergency department today with chief complaint of right eye swelling x 4 days.  He is reporting redness, swelling and tenderness to his right eye.  He also has yellow discharge and when he wakes up this morning his eye was matted shut.  He does state his vision is blurry today.  He denies foreign body sensation or trauma to the eye. He rates pain 8/10 in severity. He does not wear glasses or contacts.  He was seen at urgent care yesterday for the same.  He was discharged with doxycycline and tobramycin eyedrops.  He states despite using these the swelling has progressively worsened.  Denies fevers at home, photophobia, eye itching, congestion. History provided by patient with additional history obtained from chart review.      Past Medical History:  Diagnosis Date  . Diabetes mellitus without complication ([place])   . Essential hypertension 02/22/2014  . Hyperlipidemia   . Hypertension   . Inguinal hernia 10/07/2014    Patient Active Problem List   Diagnosis Date Noted  . S/P inguinal hernia repair, follow-up exam 01/04/2015  . Bilateral inguinal hernia without obstruction or gangrene 11/07/2014  . Obese 11/07/2014  . Inguinal hernia 10/07/2014  . Essential hypertension 02/22/2014  . DM type 2 (diabetes mellitus, type 2) ([place]) 02/22/2014    Past Surgical History:  Procedure Laterality Date  . HERNIA REPAIR          Home Medications    Prior to Admission medications   Medication Sig Start Date End Date Taking? Authorizing Provider  amLODipine (NORVASC) 10 MG tablet Take 1 tablet (10 mg total) by mouth daily. 02/21/19   Robyn Haber, MD  atorvastatin  (LIPITOR) 20 MG tablet Take 1 tablet (20 mg total) by mouth daily. 02/05/18   Charlott Rakes, MD  Blood Glucose Monitoring Suppl (BLOOD GLUCOSE METER KIT AND SUPPLIES) Dispense based on patient and insurance preference. Test blood sugar once daily as directed. (FOR ICD-9 250.00, 250.01). 01/18/14   Lance Bosch, NP  doxycycline (VIBRA-TABS) 100 MG tablet Take 1 tablet (100 mg total) by mouth 2 (two) times daily. 02/21/19   Robyn Haber, MD  glucose blood test strip Use as instructed 10/01/16   Alfonse Spruce, FNP  hydrochlorothiazide (HYDRODIURIL) 25 MG tablet Take 1 tablet (25 mg total) by mouth daily. 02/21/19   Robyn Haber, MD  losartan (COZAAR) 100 MG tablet Take 1 tablet (100 mg total) by mouth daily. 02/03/18   Charlott Rakes, MD  metFORMIN (GLUCOPHAGE-XR) 500 MG 24 hr tablet Take 1 tablet (500 mg total) by mouth daily with breakfast. 02/03/18   Charlott Rakes, MD  pravastatin (PRAVACHOL) 20 MG tablet Take 1 tablet (20 mg total) by mouth daily. 02/03/18   Charlott Rakes, MD  tobramycin (TOBREX) 0.3 % ophthalmic solution Place 1 drop into the right eye every 4 (four) hours. 02/21/19   Robyn Haber, MD    Family History Family History  Problem Relation Age of Onset  . Diabetes Father   . Hypertension Father   . Colon cancer Paternal Uncle   . Colon polyps Neg Hx   . Esophageal cancer Neg Hx   . Rectal cancer Neg  Hx   . Stomach cancer Neg Hx     Social History Social History   Tobacco Use  . Smoking status: Never Smoker  . Smokeless tobacco: Never Used  Substance Use Topics  . Alcohol use: No    Comment: Pt states he used to drink but quit approximately 2 months ago.   . Drug use: No     Allergies   Patient has no known allergies.   Review of Systems Review of Systems All other systems are reviewed and are negative for acute change except as noted in the HPI.   Physical Exam Updated Vital Signs BP (!) 139/98 (BP Location: Right Arm)   Pulse  (!) 112   Temp 99.8 F (37.7 C) (Oral)   Resp 18   Ht '6\' 2"'$  (1.88 m)   Wt 127 kg   SpO2 98%   BMI 35.95 kg/m   Physical Exam Vitals signs and nursing note reviewed.  Constitutional:      General: He is not in acute distress.    Appearance: He is not ill-appearing.  HENT:     Head: Normocephalic and atraumatic.      Right Ear: Tympanic membrane and external ear normal.     Left Ear: Tympanic membrane and external ear normal.     Nose: Nose normal.     Mouth/Throat:     Mouth: Mucous membranes are moist.     Pharynx: Oropharynx is clear.  Eyes:     General: No scleral icterus.       Right eye: No discharge.        Left eye: No discharge.     Extraocular Movements: Extraocular movements intact.     Conjunctiva/sclera:     Right eye: Right conjunctiva is injected.     Left eye: Left conjunctiva is not injected.     Pupils: Pupils are equal, round, and reactive to light.     Comments: Erythema and edema to right eye lid. Clear drainage from right eye.   Neck:     Musculoskeletal: Normal range of motion.     Vascular: No JVD.  Cardiovascular:     Rate and Rhythm: Regular rhythm. Tachycardia present.     Pulses: Normal pulses.          Radial pulses are 2+ on the right side and 2+ on the left side.     Heart sounds: Normal heart sounds.     Comments: Tachycardic to 110 on exam Pulmonary:     Comments: Lungs clear to auscultation in all fields. Symmetric chest rise. No wheezing, rales, or rhonchi. Abdominal:     Comments: Abdomen is soft, non-distended, and non-tender in all quadrants. No rigidity, no guarding. No peritoneal signs.  Musculoskeletal: Normal range of motion.  Skin:    General: Skin is warm and dry.     Capillary Refill: Capillary refill takes less than 2 seconds.  Neurological:     Mental Status: He is oriented to person, place, and time.     GCS: GCS eye subscore is 4. GCS verbal subscore is 5. GCS motor subscore is 6.     Comments: Fluent speech, no  facial droop.  Psychiatric:        Behavior: Behavior normal.      ED Treatments / Results  Labs (all labs ordered are listed, but only abnormal results are displayed) Labs Reviewed  CBG MONITORING, ED    EKG None  Radiology Ct Orbits Wo Contrast  Result Date:  02/22/2019 CLINICAL DATA:  58 year old male with right eyelid edema. EXAM: CT ORBITS WITHOUT CONTRAST TECHNIQUE: Multidetector CT images were obtained using the standard protocol without intravenous contrast. COMPARISON:  Head CT dated 07/03/2014. FINDINGS: Evaluation of this exam is limited in the absence of intravenous contrast. Orbits:  The globes and retro-orbital fat are preserved. Visualized sinuses: Clear. Soft tissues: Mild soft tissue thickening of the right eyelid and induration of the subcutaneous soft tissues of the right periorbital region, likely represent cellulitis. No drainable fluid collection or soft tissue gas. No radiopaque foreign object. Limited intracranial: No significant or unexpected finding. IMPRESSION: Mild soft tissue thickening of the right eyelid and subcutaneous soft tissues of the right periorbital region likely represent cellulitis. No drainable fluid collection or soft tissue gas. Electronically Signed   By: Anner Crete M.D.   On: 02/22/2019 20:23    Procedures Procedures (including critical care time)  Medications Ordered in ED Medications - No data to display   Initial Impression / Assessment and Plan / ED Course  I have reviewed the triage vital signs and the nursing notes.  Pertinent labs & imaging results that were available during my care of the patient were reviewed by me and considered in my medical decision making (see chart for details).  Patient seen and examined. He is nontoxic appearing, in no acute distress.  Patient with low-grade temp of 99.8 on arrival.  I checked patient's temperature on my exam and is 98.7.  He was tachycardic to 112.  On exam he has edema and  erythema to the right eyelid as well as right cheek.  Right eye with injected conjunctiva. Visual acuity is 20/25 in both eyes.  CT scan of orbits shows mild soft tissue thickening of the right eyelid and induration of the subcutaneous soft tissues of the right periorbital region, likely cellulitis.   Patient care transferred to Dr. Roderic Palau at the end of my shift pending ophthalmology consult. Patient presentation, ED course, and plan of care discussed with review of all pertinent labs and imaging. Please see his note for further details regarding further ED course and disposition.   Portions of this note were generated with [employer]. Dictation errors may occur despite best attempts at proofreading.   Final Clinical Impressions(s) / ED Diagnoses   Final diagnoses:  Periorbital cellulitis of right eye    ED Discharge Orders    None       Flint Melter 02/22/19 2132    Milton Ferguson, MD 02/23/19 [id number]

## 2019-02-22 NOTE — ED Triage Notes (Signed)
Pt continued right eye swelling since seen at urgent care Sunday. Pt verbalizes can see but it's blurred.

## 2019-02-22 NOTE — ED Provider Notes (Signed)
Patient with conjunctivitis and possible mild cellulitis inferior orbit.  Patient was given 1 g Rocephin and Keflex to be taken at home.  2 attempts were made to try to get in touch with the ophthalmologist on-call but were unsuccessful.  Patient wanted to go home and it was felt that close follow-up tomorrow morning would be appropriate.  The patient was told if he cannot get into see the ophthalmologist tomorrow that he has to come back to the emergency department   Milton Ferguson, MD 02/22/19 2245

## 2019-02-22 NOTE — ED Notes (Signed)
Patient ambulatory to CT

## 2019-02-22 NOTE — Discharge Instructions (Signed)
Call Dr. Baird Cancer office first thing tomorrow morning.  Tell them that you were in the emergency department tonight and we felt you needed to be seen on Tuesday in their office.  You can also tell them that we called Dr. Baird Cancer twice last night and were unsuccessful in getting in touch with him.  If for some reason you cannot be seen at their office tomorrow then you need to come back to the emergency department and we will contact them ourselves during the day.

## 2019-02-23 ENCOUNTER — Telehealth: Payer: Self-pay | Admitting: Ophthalmology

## 2019-05-28 ENCOUNTER — Ambulatory Visit: Payer: Medicaid Other | Attending: Internal Medicine

## 2019-05-28 DIAGNOSIS — Z23 Encounter for immunization: Secondary | ICD-10-CM | POA: Insufficient documentation

## 2019-05-28 NOTE — Progress Notes (Signed)
   Covid-19 Vaccination Clinic  Name:  Seth Weaver    MRN: [id number] DOB: [birth year]  05/28/2019  Seth Weaver was observed post Covid-19 immunization for 15 minutes without incident. He was provided with Vaccine Information Sheet and instruction to access the V-Safe system.   Seth Weaver was instructed to call 911 with any severe reactions post vaccine: Marland Kitchen Difficulty breathing  . Swelling of face and throat  . A fast heartbeat  . A bad rash all over body  . Dizziness and weakness

## 2019-06-29 ENCOUNTER — Ambulatory Visit: Payer: Medicaid Other | Attending: Internal Medicine

## 2019-06-29 DIAGNOSIS — Z23 Encounter for immunization: Secondary | ICD-10-CM

## 2019-06-29 NOTE — Progress Notes (Signed)
   Covid-19 Vaccination Clinic  Name:  Seth Weaver    MRN: [id number] DOB: 12/08/1960  06/29/2019  Seth Weaver was observed post Covid-19 immunization for 15 minutes without incident. He was provided with Vaccine Information Sheet and instruction to access the V-Safe system.   Seth Weaver was instructed to call 911 with any severe reactions post vaccine: Marland Kitchen Difficulty breathing  . Swelling of face and throat  . A fast heartbeat  . A bad rash all over body  . Dizziness and weakness   Immunizations Administered    Name Date Dose VIS Date Route   Pfizer COVID-19 Vaccine 06/29/2019  3:52 PM 0.3 mL 03/05/2019 Intramuscular   Manufacturer: [employer], [employer]   Lot: [id number]   [place]: [id number]

## 2019-07-27 ENCOUNTER — Other Ambulatory Visit: Payer: Self-pay

## 2019-07-27 ENCOUNTER — Encounter: Payer: Self-pay | Admitting: Family Medicine

## 2019-07-27 ENCOUNTER — Ambulatory Visit: Payer: Medicaid Other | Attending: Family Medicine | Admitting: Family Medicine

## 2019-07-27 VITALS — BP 158/96 | HR 116 | Ht 74.0 in | Wt 309.8 lb

## 2019-07-27 DIAGNOSIS — E785 Hyperlipidemia, unspecified: Secondary | ICD-10-CM | POA: Diagnosis not present

## 2019-07-27 DIAGNOSIS — R0609 Other forms of dyspnea: Secondary | ICD-10-CM

## 2019-07-27 DIAGNOSIS — E11628 Type 2 diabetes mellitus with other skin complications: Secondary | ICD-10-CM

## 2019-07-27 DIAGNOSIS — I10 Essential (primary) hypertension: Secondary | ICD-10-CM | POA: Diagnosis not present

## 2019-07-27 DIAGNOSIS — Z7984 Long term (current) use of oral hypoglycemic drugs: Secondary | ICD-10-CM | POA: Insufficient documentation

## 2019-07-27 DIAGNOSIS — L84 Corns and callosities: Secondary | ICD-10-CM

## 2019-07-27 DIAGNOSIS — Z9114 Patient's other noncompliance with medication regimen: Secondary | ICD-10-CM | POA: Insufficient documentation

## 2019-07-27 DIAGNOSIS — Z833 Family history of diabetes mellitus: Secondary | ICD-10-CM | POA: Insufficient documentation

## 2019-07-27 DIAGNOSIS — Z8249 Family history of ischemic heart disease and other diseases of the circulatory system: Secondary | ICD-10-CM | POA: Diagnosis not present

## 2019-07-27 DIAGNOSIS — Z634 Disappearance and death of family member: Secondary | ICD-10-CM

## 2019-07-27 DIAGNOSIS — R0600 Dyspnea, unspecified: Secondary | ICD-10-CM | POA: Diagnosis not present

## 2019-07-27 DIAGNOSIS — Z79899 Other long term (current) drug therapy: Secondary | ICD-10-CM | POA: Diagnosis not present

## 2019-07-27 LAB — POCT GLYCOSYLATED HEMOGLOBIN (HGB A1C): HbA1c, POC (controlled diabetic range): 5.8 % (ref 0.0–7.0)

## 2019-07-27 LAB — GLUCOSE, POCT (MANUAL RESULT ENTRY): POC Glucose: 90 mg/dl (ref 70–99)

## 2019-07-27 MED ORDER — AMLODIPINE BESYLATE 10 MG PO TABS: 10.0000 mg | ORAL_TABLET | Freq: Every day | ORAL | 1 refills | Status: DC

## 2019-07-27 MED ORDER — ATORVASTATIN CALCIUM 20 MG PO TABS: 20.0000 mg | ORAL_TABLET | Freq: Every day | ORAL | 1 refills | Status: DC

## 2019-07-27 MED ORDER — LOSARTAN POTASSIUM-HCTZ 100-25 MG PO TABS: 1.0000 | ORAL_TABLET | Freq: Every day | ORAL | 1 refills | Status: DC

## 2019-07-27 MED FILL — ATORVASTATIN CALCIUM 20 MG: 20 | 90 days supply | Qty: 90 | Fill #0

## 2019-07-27 MED FILL — LOSARTAN-HCTZ 100-25 MG TAB: 100-25 | 90 days supply | Qty: 90 | Fill #0

## 2019-07-27 MED FILL — AMLODIPINE BESYLATE 10 MG T: 10 | 90 days supply | Qty: 90 | Fill #0

## 2019-07-27 NOTE — Progress Notes (Signed)
Patient has been out of medication for 1 month.  He has stopped taking metformin due to side effects.

## 2019-07-27 NOTE — Progress Notes (Signed)
Subjective:  Patient ID: Seth Weaver, male    DOB: 1960-06-05  Age: 59 y.o. MRN: [id number]  CC: Diabetes and Hypertension   HPI Alika Merolla is a 59 year old male with a history of type 2 diabetes mellitus (A1c 5.8), hypertension, hyperlipidemia who presents for follow-up visit.  He stopped his Metformin due to concerns over recalls He has been out of medications x1 month hence his elevated blood pressure.  Complains of dyspnea, almost felt like going to the ED 3 days ago as he had chest congestion which cleared with a mucolytic. He has gained 29 lbs since his last visit, 6 months ago He lost his brother last year and he is sad; started drinking again as a result.  Denies suicidal ideation or intent. Past Medical History:  Diagnosis Date  . Diabetes mellitus without complication ([place])   . Essential hypertension 02/22/2014  . Hyperlipidemia   . Hypertension   . Inguinal hernia 10/07/2014    Past Surgical History:  Procedure Laterality Date  . HERNIA REPAIR      Family History  Problem Relation Age of Onset  . Diabetes Father   . Hypertension Father   . Colon cancer Paternal Uncle   . Colon polyps Neg Hx   . Esophageal cancer Neg Hx   . Rectal cancer Neg Hx   . Stomach cancer Neg Hx     No Known Allergies  Outpatient Medications Prior to Visit  Medication Sig Dispense Refill  . amLODipine (NORVASC) 10 MG tablet Take 1 tablet (10 mg total) by mouth daily. 90 tablet 3  . atorvastatin (LIPITOR) 20 MG tablet Take 1 tablet (20 mg total) by mouth daily. 90 tablet 1  . Blood Glucose Monitoring Suppl (BLOOD GLUCOSE METER KIT AND SUPPLIES) Dispense based on patient and insurance preference. Test blood sugar once daily as directed. (FOR ICD-9 250.00, 250.01). 1 each 0  . glucose blood test strip Use as instructed 100 each 12  . hydrochlorothiazide (HYDRODIURIL) 25 MG tablet Take 1 tablet (25 mg total) by mouth daily. 90 tablet 3  . losartan (COZAAR) 100 MG tablet Take 1 tablet  (100 mg total) by mouth daily. 90 tablet 1  . metFORMIN (GLUCOPHAGE-XR) 500 MG 24 hr tablet Take 1 tablet (500 mg total) by mouth daily with breakfast. 90 tablet 1  . pravastatin (PRAVACHOL) 20 MG tablet Take 1 tablet (20 mg total) by mouth daily. 90 tablet 1  . cephALEXin (KEFLEX) 500 MG capsule Take 1 capsule (500 mg total) by mouth 4 (four) times daily. (Patient not taking: Reported on 07/27/2019) 40 capsule 0  . doxycycline (VIBRA-TABS) 100 MG tablet Take 1 tablet (100 mg total) by mouth 2 (two) times daily. (Patient not taking: Reported on 07/27/2019) 20 tablet 0  . tobramycin (TOBREX) 0.3 % ophthalmic solution Place 1 drop into the right eye every 4 (four) hours. (Patient not taking: Reported on 07/27/2019) 5 mL 0   Facility-Administered Medications Prior to Visit  Medication Dose Route Frequency Provider Last Rate Last Admin  . 0.9 %  sodium chloride infusion  500 mL Intravenous Once Nelida Meuse III, MD         ROS Review of Systems  Constitutional: Negative for activity change and appetite change.  HENT: Negative for sinus pressure and sore throat.   Eyes: Negative for visual disturbance.  Respiratory: Positive for shortness of breath. Negative for cough and chest tightness.   Cardiovascular: Negative for chest pain and leg swelling.  Gastrointestinal: Negative for abdominal  distention, abdominal pain, constipation and diarrhea.  Endocrine: Negative.   Genitourinary: Negative for dysuria.  Musculoskeletal: Negative for joint swelling and myalgias.  Skin: Negative for rash.  Allergic/Immunologic: Negative.   Neurological: Negative for weakness, light-headedness and numbness.  Psychiatric/Behavioral: Positive for dysphoric mood. Negative for suicidal ideas.    Objective:  BP (!) 158/96   Pulse (!) 116   Ht 6' 2" (1.88 m)   Wt (!) 309 lb 12.8 oz (140.5 kg)   SpO2 97%   BMI 39.78 kg/m   BP/Weight 07/27/2019 02/22/2019 32/54/9826  Systolic BP 415 830 940  Diastolic BP 96 97 768   Wt. (Lbs) 309.8 280 -  BMI 39.78 35.95 -      Physical Exam Constitutional:      Appearance: He is well-developed. He is obese.  Neck:     Vascular: No JVD.  Cardiovascular:     Rate and Rhythm: Normal rate.     Heart sounds: Normal heart sounds. No murmur.  Pulmonary:     Effort: Pulmonary effort is normal.     Breath sounds: Normal breath sounds. No wheezing or rales.  Chest:     Chest wall: No tenderness.  Abdominal:     General: Bowel sounds are normal. There is no distension.     Palpations: Abdomen is soft. There is no mass.     Tenderness: There is no abdominal tenderness.  Musculoskeletal:        General: Normal range of motion.     Right lower leg: No edema.     Left lower leg: No edema.  Neurological:     Mental Status: He is alert and oriented to person, place, and time.  Psychiatric:     Comments: Dysphoric mood     CMP Latest Ref Rng & Units 02/04/2018 06/25/2017 05/27/2016  Glucose 65 - 99 mg/dL 91 98 104(H)  BUN 6 - 24 mg/dL _0 Creatinine 0.76 - 1.27 mg/dL 1.29(H) 1.11 1.20  Sodium 134 - 144 mmol/L 140 139 137  Potassium 3.5 - 5.2 mmol/L 4.5 4.3 4.5  Chloride 96 - 106 mmol/L 98 99 106  CO2 20 - 29 mmol/L _1 Calcium 8.7 - 10.2 mg/dL 9.2 9.5 8.5(L)  Total Protein 6.0 - 8.5 g/dL - 7.0 -  Total Bilirubin 0.0 - 1.2 mg/dL - 0.4 -  Alkaline Phos 39 - 117 IU/L - 68 -  AST 0 - 40 IU/L - 18 -  ALT 0 - 44 IU/L - 19 -    Lipid Panel     Component Value Date/Time   CHOL 179 02/04/2018 0909   TRIG 75 02/04/2018 0909   HDL 46 02/04/2018 0909   CHOLHDL 3.9 02/04/2018 0909   CHOLHDL 4.4 01/07/2014 0921   VLDL 21 01/07/2014 0921   LDLCALC 118 (H) 02/04/2018 0909    CBC    Component Value Date/Time   WBC 8.9 05/27/2016 1316   RBC 5.04 05/27/2016 1316   HGB 14.2 05/27/2016 1316   HCT 42.3 05/27/2016 1316   PLT 223 05/27/2016 1316   MCV 83.9 05/27/2016 1316   MCH 28.2 05/27/2016 1316   MCHC 33.6 05/27/2016 1316   RDW 15.3 05/27/2016 1316      Lab Results  Component Value Date   HGBA1C 5.8 07/27/2019    Assessment & Plan:   1. Controlled type 2 diabetes mellitus with other skin complication, without long-term current use of insulin (HCC) Controlled with A1c of 5.8 He is  doing well off Metformin hence we will not restart it but continue on diet control and lifestyle modifications Counseled on Diabetic diet, my plate method, 098 minutes of moderate intensity exercise/week Blood sugar logs with fasting goals of 80-120 mg/dl, random of less than 180 and in the event of sugars less than 60 mg/dl or greater than 400 mg/dl encouraged to notify the clinic. Advised on the need for annual eye exams, annual foot exams, Pneumonia vaccine. - POCT glucose (manual entry) - POCT glycosylated hemoglobin (Hb A1C) - CMP14+EGFR - atorvastatin (LIPITOR) 20 MG tablet; Take 1 tablet (20 mg total) by mouth daily.  Dispense: 90 tablet; Refill: 1 - Ambulatory referral to Ophthalmology  2. Essential hypertension Uncontrolled due to running out of his antihypertensive which I have refilled Counseled on blood pressure goal of less than 130/80, low-sodium, DASH diet, medication compliance, 150 minutes of moderate intensity exercise per week. Discussed medication compliance, adverse effects. - losartan-hydrochlorothiazide (HYZAAR) 100-25 MG tablet; Take 1 tablet by mouth daily.  Dispense: 90 tablet; Refill: 1 - amLODipine (NORVASC) 10 MG tablet; Take 1 tablet (10 mg total) by mouth daily.  Dispense: 90 tablet; Refill: 1  3. Callus of foot - Ambulatory referral to Podiatry  4. Other form of dyspnea His weight gain of 29 pounds could explain things he currently does not have evidence of CHF We will send of BMP - Brain natriuretic peptide  5. Bereavement Provided brief counseling Recommended grief counseling referral which he declined and he is not open to medication at this time Counseled on alcohol cessation and he will be working on  this  Return in about 6 months (around 01/27/2020) for chronic disease management.      Charlott Rakes, MD, FAAFP. [facility] [place], [place]   07/27/2019, 2:38 PM

## 2019-07-27 NOTE — Patient Instructions (Signed)
Diabetes Mellitus and Foot Care Foot care is an important part of your health, especially when you have diabetes. Diabetes may cause you to have problems because of poor blood flow (circulation) to your feet and legs, which can cause your skin to:  Become thinner and drier.  Break more easily.  Heal more slowly.  Peel and crack. You may also have nerve damage (neuropathy) in your legs and feet, causing decreased feeling in them. This means that you may not notice minor injuries to your feet that could lead to more serious problems. Noticing and addressing any potential problems early is the best way to prevent future foot problems. How to care for your feet Foot hygiene  Wash your feet daily with warm water and mild soap. Do not use hot water. Then, pat your feet and the areas between your toes until they are completely dry. Do not soak your feet as this can dry your skin.  Trim your toenails straight across. Do not dig under them or around the cuticle. File the edges of your nails with an emery board or nail file.  Apply a moisturizing lotion or petroleum jelly to the skin on your feet and to dry, brittle toenails. Use lotion that does not contain alcohol and is unscented. Do not apply lotion between your toes. Shoes and socks  Wear clean socks or stockings every day. Make sure they are not too tight. Do not wear knee-high stockings since they may decrease blood flow to your legs.  Wear shoes that fit properly and have enough cushioning. Always look in your shoes before you put them on to be sure there are no objects inside.  To break in new shoes, wear them for just a few hours a day. This prevents injuries on your feet. Wounds, scrapes, corns, and calluses  Check your feet daily for blisters, cuts, bruises, sores, and redness. If you cannot see the bottom of your feet, use a mirror or ask someone for help.  Do not cut corns or calluses or try to remove them with medicine.  If you  find a minor scrape, cut, or break in the skin on your feet, keep it and the skin around it clean and dry. You may clean these areas with mild soap and water. Do not clean the area with peroxide, alcohol, or iodine.  If you have a wound, scrape, corn, or callus on your foot, look at it several times a day to make sure it is healing and not infected. Check for: ? Redness, swelling, or pain. ? Fluid or blood. ? Warmth. ? Pus or a bad smell. General instructions  Do not cross your legs. This may decrease blood flow to your feet.  Do not use heating pads or hot water bottles on your feet. They may burn your skin. If you have lost feeling in your feet or legs, you may not know this is happening until it is too late.  Protect your feet from hot and cold by wearing shoes, such as at the beach or on hot pavement.  Schedule a complete foot exam at least once a year (annually) or more often if you have foot problems. If you have foot problems, report any cuts, sores, or bruises to your health care provider immediately. Contact a health care provider if:  You have a medical condition that increases your risk of infection and you have any cuts, sores, or bruises on your feet.  You have an injury that is not   healing.  You have redness on your legs or feet.  You feel burning or tingling in your legs or feet.  You have pain or cramps in your legs and feet.  Your legs or feet are numb.  Your feet always feel cold.  You have pain around a toenail. Get help right away if:  You have a wound, scrape, corn, or callus on your foot and: ? You have pain, swelling, or redness that gets worse. ? You have fluid or blood coming from the wound, scrape, corn, or callus. ? Your wound, scrape, corn, or callus feels warm to the touch. ? You have pus or a bad smell coming from the wound, scrape, corn, or callus. ? You have a fever. ? You have a red line going up your leg. Summary  Check your feet every day  for cuts, sores, red spots, swelling, and blisters.  Moisturize feet and legs daily.  Wear shoes that fit properly and have enough cushioning.  If you have foot problems, report any cuts, sores, or bruises to your health care provider immediately.  Schedule a complete foot exam at least once a year (annually) or more often if you have foot problems. This information is not intended to replace advice given to you by your health care provider. Make sure you discuss any questions you have with your health care provider. Document Revised: 12/02/2018 Document Reviewed: 04/12/2016 Elsevier Patient Education  2020 [employer]  

## 2019-07-28 LAB — CMP14+EGFR
ALT: 24 IU/L (ref 0–44)
AST: 27 IU/L (ref 0–40)
Albumin/Globulin Ratio: 1.5 (ref 1.2–2.2)
Albumin: 3.8 g/dL (ref 3.8–4.9)
Alkaline Phosphatase: 65 IU/L (ref 39–117)
BUN/Creatinine Ratio: 16 (ref 9–20)
BUN: 16 mg/dL (ref 6–24)
Bilirubin Total: 0.3 mg/dL (ref 0.0–1.2)
CO2: 23 mmol/L (ref 20–29)
Calcium: 8.7 mg/dL (ref 8.7–10.2)
Chloride: 107 mmol/L — ABNORMAL HIGH (ref 96–106)
Creatinine, Ser: 1.02 mg/dL (ref 0.76–1.27)
GFR calc Af Amer: 93 mL/min/{1.73_m2} (ref >59)
GFR calc non Af Amer: 81 mL/min/{1.73_m2} (ref >59)
Globulin, Total: 2.6 g/dL (ref 1.5–4.5)
Glucose: 87 mg/dL (ref 65–99)
Potassium: 4.4 mmol/L (ref 3.5–5.2)
Sodium: 142 mmol/L (ref 134–144)
Total Protein: 6.4 g/dL (ref 6.0–8.5)

## 2019-07-28 LAB — BRAIN NATRIURETIC PEPTIDE: BNP: 89.6 pg/mL (ref 0.0–100.0)

## 2019-07-29 ENCOUNTER — Telehealth: Payer: Self-pay

## 2019-07-29 NOTE — Telephone Encounter (Signed)
Patient called in and requested for lab results. Patient was identified by 2 patient identifiers. Patient was informed of results, verbalized understanding and had no further questions or concerns at this time.

## 2019-07-29 NOTE — Telephone Encounter (Signed)
Patient was called and a voicemail was left informing patient to return phone call for lab results. 

## 2019-07-29 NOTE — Telephone Encounter (Signed)
-----   Message from Charlott Rakes, MD sent at 07/28/2019  5:19 PM EDT ----- Please inform the patient that labs are normal. Thank you.

## 2019-11-08 MED FILL — AMLODIPINE BESYLATE 10 MG T: 10 | 90 days supply | Qty: 90 | Fill #1

## 2019-11-08 MED FILL — ATORVASTATIN CALCIUM 20 MG: 20 | 90 days supply | Qty: 90 | Fill #1

## 2019-11-08 MED FILL — LOSARTAN-HCTZ 100-25 MG TAB: 100-25 | 90 days supply | Qty: 90 | Fill #1

## 2020-01-03 ENCOUNTER — Telehealth: Payer: Self-pay

## 2020-01-03 NOTE — Telephone Encounter (Signed)
Patient came into clinic to set up his 6 month follow up but would like for PCP to give him a call. Patient states its in regards to a medication but would like to speak to PCP directly.  Please advise [phone]

## 2020-01-04 NOTE — Telephone Encounter (Signed)
Call placed to patient to get information on medication. Message states that voicemail is currently full.

## 2020-01-25 ENCOUNTER — Other Ambulatory Visit: Payer: Self-pay | Admitting: Family Medicine

## 2020-01-25 ENCOUNTER — Encounter: Payer: Self-pay | Admitting: Family Medicine

## 2020-01-25 ENCOUNTER — Ambulatory Visit: Payer: Medicaid Other | Attending: Family Medicine | Admitting: Family Medicine

## 2020-01-25 ENCOUNTER — Other Ambulatory Visit: Payer: Self-pay

## 2020-01-25 VITALS — BP 119/77 | HR 99 | Ht 74.0 in | Wt 314.0 lb

## 2020-01-25 DIAGNOSIS — E11628 Type 2 diabetes mellitus with other skin complications: Secondary | ICD-10-CM

## 2020-01-25 DIAGNOSIS — N528 Other male erectile dysfunction: Secondary | ICD-10-CM

## 2020-01-25 DIAGNOSIS — I10 Essential (primary) hypertension: Secondary | ICD-10-CM

## 2020-01-25 LAB — POCT GLYCOSYLATED HEMOGLOBIN (HGB A1C): HbA1c, POC (controlled diabetic range): 6.3 % (ref 0.0–7.0)

## 2020-01-25 MED ORDER — LOSARTAN POTASSIUM-HCTZ 100-25 MG PO TABS: 1.0000 | ORAL_TABLET | Freq: Every day | ORAL | 1 refills | Status: DC

## 2020-01-25 MED ORDER — ATORVASTATIN CALCIUM 20 MG PO TABS: 20.0000 mg | ORAL_TABLET | Freq: Every day | ORAL | 1 refills | Status: DC

## 2020-01-25 MED ORDER — AMLODIPINE BESYLATE 10 MG PO TABS: 10.0000 mg | ORAL_TABLET | Freq: Every day | ORAL | 1 refills | Status: DC

## 2020-01-25 MED ORDER — SILDENAFIL CITRATE 50 MG PO TABS: 50.0000 mg | ORAL_TABLET | Freq: Every day | ORAL | 1 refills | Status: DC | PRN

## 2020-01-25 MED FILL — SILDENAFIL CITRATE 50 MG TA: 50 | 30 days supply | Qty: 4 | Fill #0

## 2020-01-25 NOTE — Progress Notes (Signed)
Subjective:  Patient ID: Seth Weaver, male    DOB: [birth year]  Age: 59 y.o. MRN: [id number]  CC: Hypertension   HPI Seth Weaver  is a 59 year old male with a history of type 2 diabetes mellitus (A1c6.3), hypertension, hyperlipidemia who presentsfor follow-up visit.  He gained 34 lbs in the last one year and endorses drinking lots of sodas.  He is A1c 6.3 which is up from 5.8 previously.  He is currently on diet control for his diabetes. Does not exercise at all due to busy work schedule.  Compliant with his antihypertensive and his statin and he is tolerating his medications well. He requests a prescription for Viagra for ED. Past Medical History:  Diagnosis Date  . Diabetes mellitus without complication ([place])   . Essential hypertension 02/22/2014  . Hyperlipidemia   . Hypertension   . Inguinal hernia 10/07/2014    Past Surgical History:  Procedure Laterality Date  . HERNIA REPAIR      Family History  Problem Relation Age of Onset  . Diabetes Father   . Hypertension Father   . Colon cancer Paternal Uncle   . Colon polyps Neg Hx   . Esophageal cancer Neg Hx   . Rectal cancer Neg Hx   . Stomach cancer Neg Hx     No Known Allergies  Outpatient Medications Prior to Visit  Medication Sig Dispense Refill  . Blood Glucose Monitoring Suppl (BLOOD GLUCOSE METER KIT AND SUPPLIES) Dispense based on patient and insurance preference. Test blood sugar once daily as directed. (FOR ICD-9 250.00, 250.01). 1 each 0  . glucose blood test strip Use as instructed 100 each 12  . amLODipine (NORVASC) 10 MG tablet Take 1 tablet (10 mg total) by mouth daily. 90 tablet 1  . atorvastatin (LIPITOR) 20 MG tablet Take 1 tablet (20 mg total) by mouth daily. 90 tablet 1  . losartan-hydrochlorothiazide (HYZAAR) 100-25 MG tablet Take 1 tablet by mouth daily. 90 tablet 1  . cephALEXin (KEFLEX) 500 MG capsule Take 1 capsule (500 mg total) by mouth 4 (four) times daily. (Patient not taking: Reported  on 07/27/2019) 40 capsule 0  . doxycycline (VIBRA-TABS) 100 MG tablet Take 1 tablet (100 mg total) by mouth 2 (two) times daily. (Patient not taking: Reported on 07/27/2019) 20 tablet 0  . tobramycin (TOBREX) 0.3 % ophthalmic solution Place 1 drop into the right eye every 4 (four) hours. (Patient not taking: Reported on 07/27/2019) 5 mL 0   Facility-Administered Medications Prior to Visit  Medication Dose Route Frequency Provider Last Rate Last Admin  . 0.9 %  sodium chloride infusion  500 mL Intravenous Once Nelida Meuse III, MD         ROS Review of Systems  Constitutional: Negative for activity change and appetite change.  HENT: Negative for sinus pressure and sore throat.   Eyes: Negative for visual disturbance.  Respiratory: Negative for cough, chest tightness and shortness of breath.   Cardiovascular: Negative for chest pain and leg swelling.  Gastrointestinal: Negative for abdominal distention, abdominal pain, constipation and diarrhea.  Endocrine: Negative.   Genitourinary: Negative for dysuria.  Musculoskeletal: Negative for joint swelling and myalgias.  Skin: Negative for rash.  Allergic/Immunologic: Negative.   Neurological: Negative for weakness, light-headedness and numbness.  Psychiatric/Behavioral: Negative for dysphoric mood and suicidal ideas.    Objective:  BP 119/77   Pulse 99   Ht _0  (1.88 m)   Wt (!) 314 lb (142.4 kg)   SpO2 98%  BMI 40.32 kg/m   BP/Weight 01/25/2020 07/27/2019 96/28/3662  Systolic BP 947 654 650  Diastolic BP 77 96 97  Wt. (Lbs) 314 309.8 280  BMI 40.32 39.78 35.95      Physical Exam Constitutional:      Appearance: He is well-developed. He is obese.  Neck:     Vascular: No JVD.  Cardiovascular:     Rate and Rhythm: Normal rate.     Heart sounds: Normal heart sounds. No murmur heard.   Pulmonary:     Effort: Pulmonary effort is normal.     Breath sounds: Normal breath sounds. No wheezing or rales.  Chest:     Chest wall: No  tenderness.  Abdominal:     General: Bowel sounds are normal. There is no distension.     Palpations: Abdomen is soft. There is no mass.     Tenderness: There is no abdominal tenderness.  Musculoskeletal:        General: Normal range of motion.     Right lower leg: No edema.     Left lower leg: No edema.  Neurological:     Mental Status: He is alert and oriented to person, place, and time.  Psychiatric:        Mood and Affect: Mood normal.     CMP Latest Ref Rng & Units 07/27/2019 02/04/2018 06/25/2017  Glucose 65 - 99 mg/dL 87 91 98  BUN 6 - 24 mg/dL _0 Creatinine 0.76 - 1.27 mg/dL 1.02 1.29(H) 1.11  Sodium 134 - 144 mmol/L 142 140 139  Potassium 3.5 - 5.2 mmol/L 4.4 4.5 4.3  Chloride 96 - 106 mmol/L 107(H) 98 99  CO2 20 - 29 mmol/L _1 Calcium 8.7 - 10.2 mg/dL 8.7 9.2 9.5  Total Protein 6.0 - 8.5 g/dL 6.4 - 7.0  Total Bilirubin 0.0 - 1.2 mg/dL 0.3 - 0.4  Alkaline Phos 39 - 117 IU/L 65 - 68  AST 0 - 40 IU/L 27 - 18  ALT 0 - 44 IU/L 24 - 19    Lipid Panel     Component Value Date/Time   CHOL 179 02/04/2018 0909   TRIG 75 02/04/2018 0909   HDL 46 02/04/2018 0909   CHOLHDL 3.9 02/04/2018 0909   CHOLHDL 4.4 01/07/2014 0921   VLDL 21 01/07/2014 0921   LDLCALC 118 (H) 02/04/2018 0909    CBC    Component Value Date/Time   WBC 8.9 05/27/2016 1316   RBC 5.04 05/27/2016 1316   HGB 14.2 05/27/2016 1316   HCT 42.3 05/27/2016 1316   PLT 223 05/27/2016 1316   MCV 83.9 05/27/2016 1316   MCH 28.2 05/27/2016 1316   MCHC 33.6 05/27/2016 1316   RDW 15.3 05/27/2016 1316    Lab Results  Component Value Date   HGBA1C 6.3 01/25/2020    Assessment & Plan:  1. Controlled type 2 diabetes mellitus with other skin complication, without long-term current use of insulin (HCC) Diet controlled with A1c of 6.3 which is up from 5.8 previously; goal is less than 7.0 Dietary indiscretion contributing to weight gain and trending up of A1c Advised that he will need to work on  his lifestyle to prevent progression and if A1c increases further we will need to initiate medication Counseled on Diabetic diet, my plate method, 354 minutes of moderate intensity exercise/week Blood sugar logs with fasting goals of 80-120 mg/dl, random of less than 180 and in the event of sugars less than 60  mg/dl or greater than 400 mg/dl encouraged to notify the clinic. Advised on the need for annual eye exams, annual foot exams, Pneumonia vaccine. - POCT glycosylated hemoglobin (Hb A1C) - atorvastatin (LIPITOR) 20 MG tablet; Take 1 tablet (20 mg total) by mouth daily.  Dispense: 90 tablet; Refill: 1  2. Essential hypertension Controlled Counseled on blood pressure goal of less than 130/80, low-sodium, DASH diet, medication compliance, 150 minutes of moderate intensity exercise per week. Discussed medication compliance, adverse effects. - losartan-hydrochlorothiazide (HYZAAR) 100-25 MG tablet; Take 1 tablet by mouth daily.  Dispense: 90 tablet; Refill: 1 - amLODipine (NORVASC) 10 MG tablet; Take 1 tablet (10 mg total) by mouth daily.  Dispense: 90 tablet; Refill: 1 - Basic Metabolic Panel  3. Morbid obesity ([place]) Unseld on avoiding late meals, reducing portion sizes We have discussed strategies to reduce sodium intake and strategies to increase physical activity by means of walking 30 minutes on 2 days of the week when he is off. He can also perform resistance and strength training while watching TV  4. Other male erectile dysfunction - sildenafil (VIAGRA) 50 MG tablet; Take 1 tablet (50 mg total) by mouth daily as needed for erectile dysfunction. At least 24 hours between doses  Dispense: 10 tablet; Refill: 1    Meds ordered this encounter  Medications  . atorvastatin (LIPITOR) 20 MG tablet    Sig: Take 1 tablet (20 mg total) by mouth daily.    Dispense:  90 tablet    Refill:  1  . losartan-hydrochlorothiazide (HYZAAR) 100-25 MG tablet    Sig: Take 1 tablet by mouth daily.     Dispense:  90 tablet    Refill:  1  . amLODipine (NORVASC) 10 MG tablet    Sig: Take 1 tablet (10 mg total) by mouth daily.    Dispense:  90 tablet    Refill:  1  . sildenafil (VIAGRA) 50 MG tablet    Sig: Take 1 tablet (50 mg total) by mouth daily as needed for erectile dysfunction. At least 24 hours between doses    Dispense:  10 tablet    Refill:  1    Follow-up: Return in about 6 months (around 07/24/2020) for Chronic disease management.       Charlott Rakes, MD, FAAFP. [facility] [place], [place]   01/25/2020, 3:23 PM

## 2020-01-25 NOTE — Patient Instructions (Signed)

## 2020-01-26 LAB — BASIC METABOLIC PANEL
BUN/Creatinine Ratio: 14 (ref 9–20)
BUN: 16 mg/dL (ref 6–24)
CO2: 27 mmol/L (ref 20–29)
Calcium: 9 mg/dL (ref 8.7–10.2)
Chloride: 102 mmol/L (ref 96–106)
Creatinine, Ser: 1.12 mg/dL (ref 0.76–1.27)
GFR calc Af Amer: 83 mL/min/{1.73_m2} (ref >59)
GFR calc non Af Amer: 72 mL/min/{1.73_m2} (ref >59)
Glucose: 105 mg/dL — ABNORMAL HIGH (ref 65–99)
Potassium: 3.9 mmol/L (ref 3.5–5.2)
Sodium: 140 mmol/L (ref 134–144)

## 2020-01-28 ENCOUNTER — Telehealth: Payer: Self-pay

## 2020-01-28 NOTE — Telephone Encounter (Signed)
-----   Message from Charlott Rakes, MD sent at 01/26/2020  8:56 AM EDT ----- Please inform the patient that labs are normal. Thank you.

## 2020-01-28 NOTE — Telephone Encounter (Signed)
Mailbox is currently full letter will be mailed to patient with normal results.

## 2020-02-22 MED FILL — ATORVASTATIN CALCIUM 20 MG: 20 | 90 days supply | Qty: 90 | Fill #0

## 2020-02-22 MED FILL — AMLODIPINE BESYLATE 10 MG T: 10 | 90 days supply | Qty: 90 | Fill #0

## 2020-02-22 MED FILL — LOSARTAN-HCTZ 100-25 MG TAB: 100-25 | 90 days supply | Qty: 90 | Fill #0

## 2020-09-19 ENCOUNTER — Other Ambulatory Visit: Payer: Self-pay

## 2020-09-19 ENCOUNTER — Other Ambulatory Visit: Payer: Self-pay | Admitting: Family Medicine

## 2020-09-19 DIAGNOSIS — I10 Essential (primary) hypertension: Secondary | ICD-10-CM

## 2020-09-19 DIAGNOSIS — E11628 Type 2 diabetes mellitus with other skin complications: Secondary | ICD-10-CM

## 2020-09-20 ENCOUNTER — Other Ambulatory Visit: Payer: Self-pay

## 2020-09-20 NOTE — Telephone Encounter (Signed)
Requested medication (s) are due for refill today: yes   Requested medication (s) are on the active medication list:  yes   Last refill:  06/05/2020  Future visit scheduled: no  Notes to clinic:  overdue for 6 month follow up Message has been sent to patient to contact office   Requested Prescriptions  Pending Prescriptions Disp Refills   amLODipine (NORVASC) 10 MG tablet 90 tablet 1    Sig: TAKE 1 TABLET (10 MG TOTAL) BY MOUTH DAILY.      Cardiovascular:  Calcium Channel Blockers Failed - 09/19/2020  4:28 PM      Failed - Valid encounter within last 6 months    Recent Outpatient Visits           7 months ago Controlled type 2 diabetes mellitus with other skin complication, without long-term current use of insulin ([place])   [facility] [place], [place], MD   1 year ago Controlled type 2 diabetes mellitus with other skin complication, without long-term current use of insulin ([place])   [facility] Charlott Rakes, MD   2 years ago Essential hypertension   [place], Charlane Ferretti, MD   3 years ago Essential hypertension   [place], Charlane Ferretti, MD   3 years ago Type 2 diabetes mellitus with complication, unspecified whether long term insulin use ([place])   [place], [place], [place]                Passed - Last BP in normal range    BP Readings from Last 1 Encounters:  01/25/20 119/77            losartan-hydrochlorothiazide (HYZAAR) 100-25 MG tablet 90 tablet 1    Sig: TAKE 1 TABLET BY MOUTH DAILY.      Cardiovascular: ARB + Diuretic Combos Failed - 09/19/2020  4:28 PM      Failed - K in normal range and within 180 days    Potassium  Date Value Ref Range Status  01/25/2020 3.9 3.5 - 5.2 mmol/L Final          Failed - Na in normal range and within 180 days    Sodium  Date Value Ref Range Status   01/25/2020 140 134 - 144 mmol/L Final          Failed - Cr in normal range and within 180 days    Creat  Date Value Ref Range Status  05/08/2015 0.99 0.70 - 1.33 mg/dL Final   Creatinine, Ser  Date Value Ref Range Status  01/25/2020 1.12 0.76 - 1.27 mg/dL Final          Failed - Ca in normal range and within 180 days    Calcium  Date Value Ref Range Status  01/25/2020 9.0 8.7 - 10.2 mg/dL Final   Calcium, Ion  Date Value Ref Range Status  10/10/2013 1.16 1.12 - 1.23 mmol/L Final          Failed - Valid encounter within last 6 months    Recent Outpatient Visits           7 months ago Controlled type 2 diabetes mellitus with other skin complication, without long-term current use of insulin (HCC)   [place], Charlane Ferretti, MD   1 year ago Controlled type 2 diabetes mellitus with other skin complication, without long-term current use of  insulin [facility])   [facility] Charlott Rakes, MD   2 years ago Essential hypertension   [place], Charlane Ferretti, MD   3 years ago Essential hypertension   [place], [place], MD   3 years ago Type 2 diabetes mellitus with complication, unspecified whether long term insulin use [facility])   [place], [place], [place]                Passed - Patient is not pregnant      Passed - Last BP in normal range    BP Readings from Last 1 Encounters:  01/25/20 119/77            atorvastatin (LIPITOR) 20 MG tablet 90 tablet 1    Sig: TAKE 1 TABLET (20 MG TOTAL) BY MOUTH DAILY.      Cardiovascular:  Antilipid - Statins Failed - 09/19/2020  4:28 PM      Failed - Total Cholesterol in normal range and within 360 days    Cholesterol, Total  Date Value Ref Range Status  02/04/2018 179 100 - 199 mg/dL Final          Failed - LDL in normal range and within 360 days     LDL Calculated  Date Value Ref Range Status  02/04/2018 118 (H) 0 - 99 mg/dL Final          Failed - HDL in normal range and within 360 days    HDL  Date Value Ref Range Status  02/04/2018 46 >39 mg/dL Final          Failed - Triglycerides in normal range and within 360 days    Triglycerides  Date Value Ref Range Status  02/04/2018 75 0 - 149 mg/dL Final          Passed - Patient is not pregnant      Passed - Valid encounter within last 12 months    Recent Outpatient Visits           7 months ago Controlled type 2 diabetes mellitus with other skin complication, without long-term current use of insulin ([place])   [place], [place], MD   1 year ago Controlled type 2 diabetes mellitus with other skin complication, without long-term current use of insulin ([place])   [facility] Charlott Rakes, MD   2 years ago Essential hypertension   [place], Charlane Ferretti, MD   3 years ago Essential hypertension   [place], [place], MD   3 years ago Type 2 diabetes mellitus with complication, unspecified whether long term insulin use [facility])   [place], Maylon [place], [place]

## 2020-10-01 IMAGING — CT CT ORBITS W/O CM
3 series · 14 of 47 positions shown, 16 images · non-contrast
Comparison: Head CT dated 07/03/2014.

CLINICAL DATA: 53-year-old male with right eyelid edema.

EXAM:
CT ORBITS WITHOUT CONTRAST
TECHNIQUE: Multidetector CT images were obtained using the standard protocol
without intravenous contrast.

[Series 4: orbits 2.0 h30s st · axial · 0.35mm/px · z∈[-120,-42]mm · 8 of 47 slices shown, 10 images]
[im 4/47  brain]
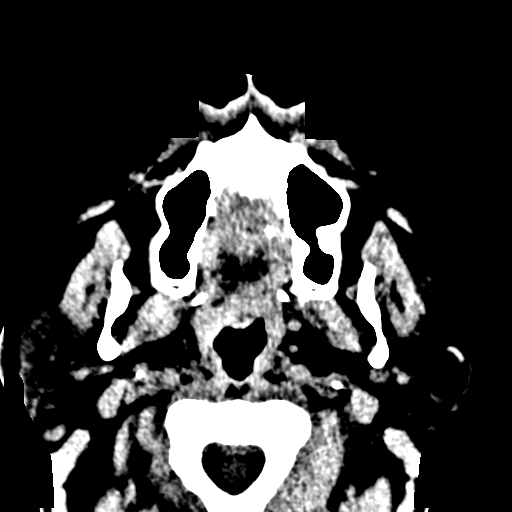
[im 4/47  bone]
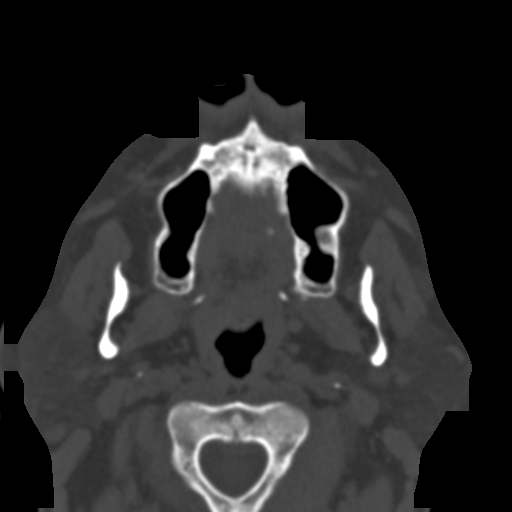
[im 10/47  bone]
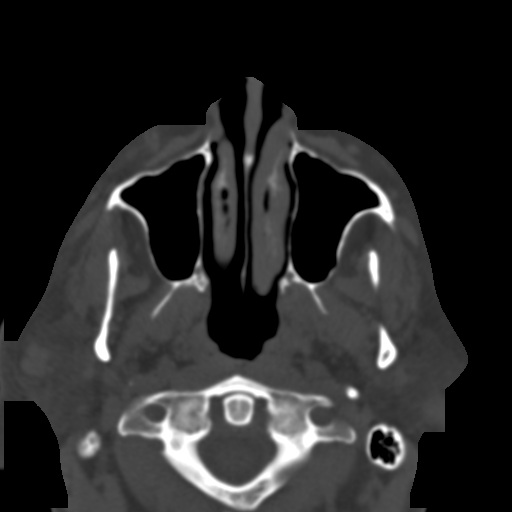
[im 15/47  bone]
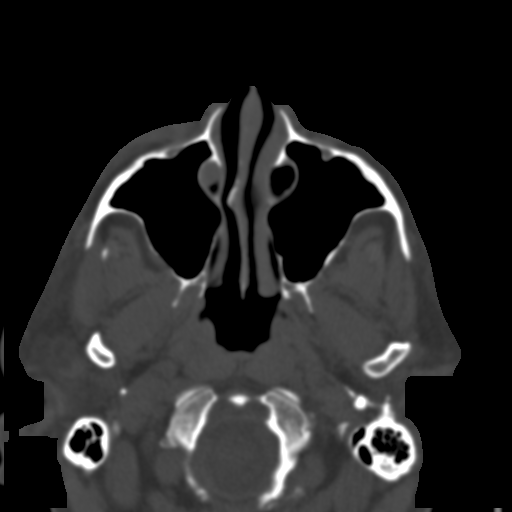
[im 21/47  bone]
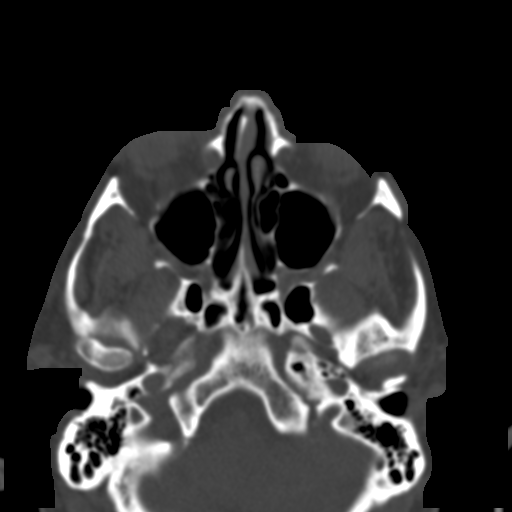
[im 26/47  brain]
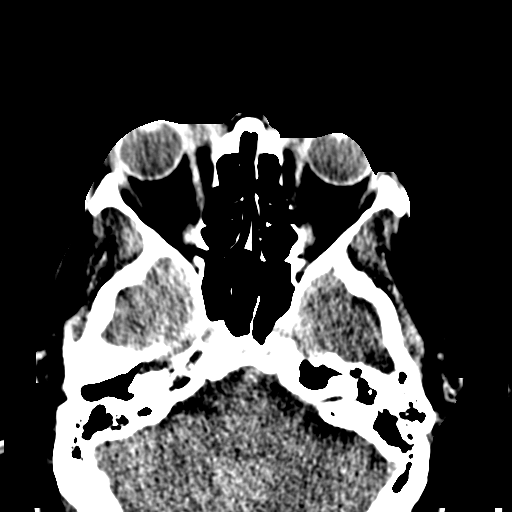
[im 26/47  bone]
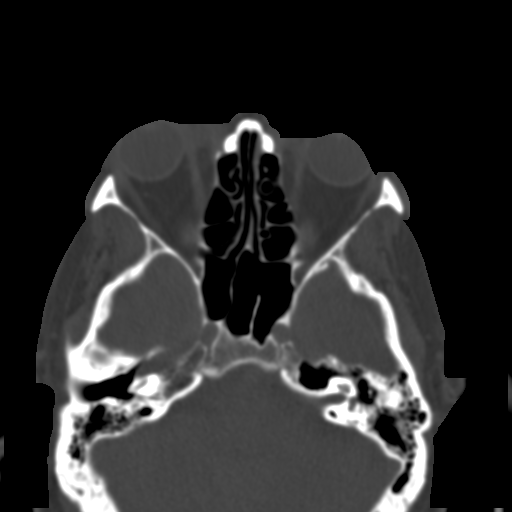
[im 32/47  bone]
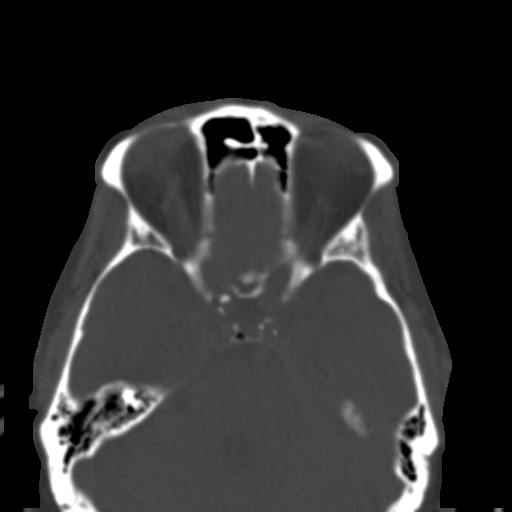
[im 37/47  bone]
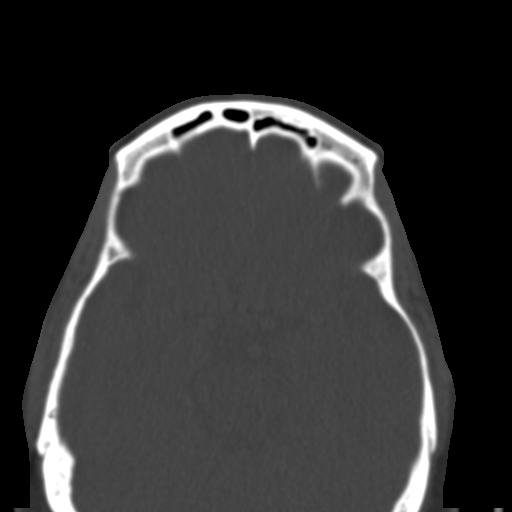
[im 43/47  bone]
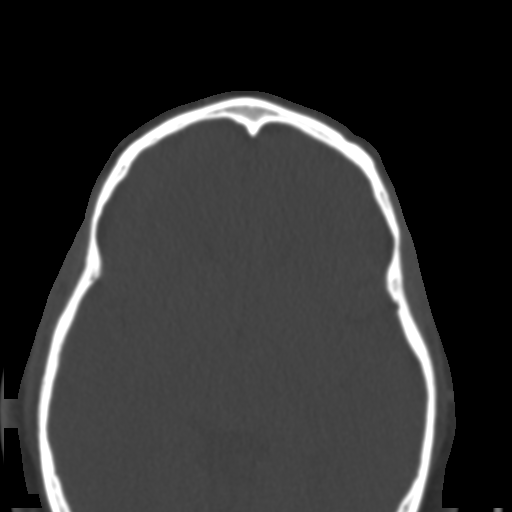

[Series 9: orbits st coronal · coronal · 0.21mm/px · 3 of 73 slices shown]
[im 25/73  bone]
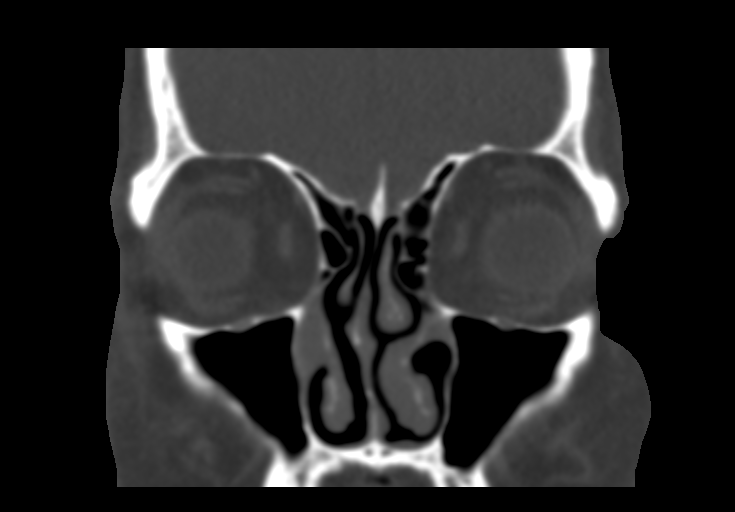
[im 33/73  bone]
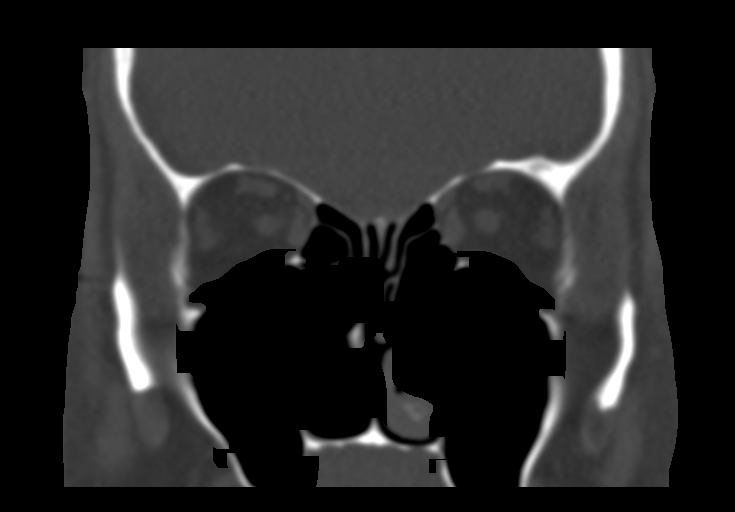
[im 41/73  bone]
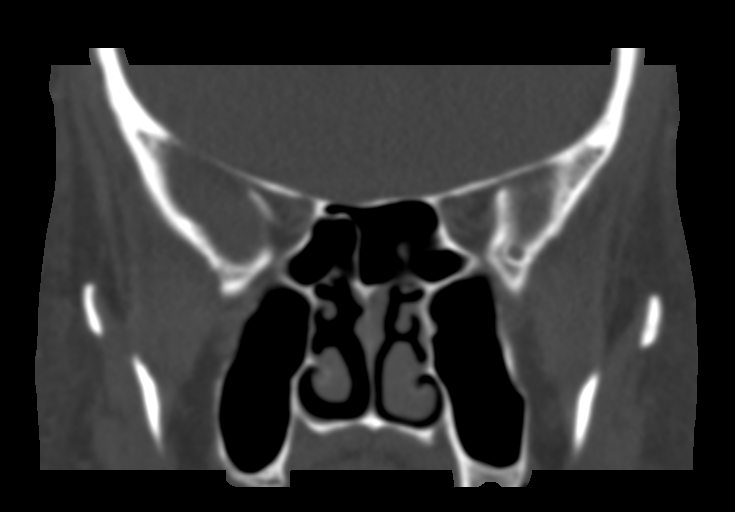

[Series 10: orbits st sagittal · sagittal · 0.21mm/px · 3 of 87 slices shown]
[im 29/87  bone]
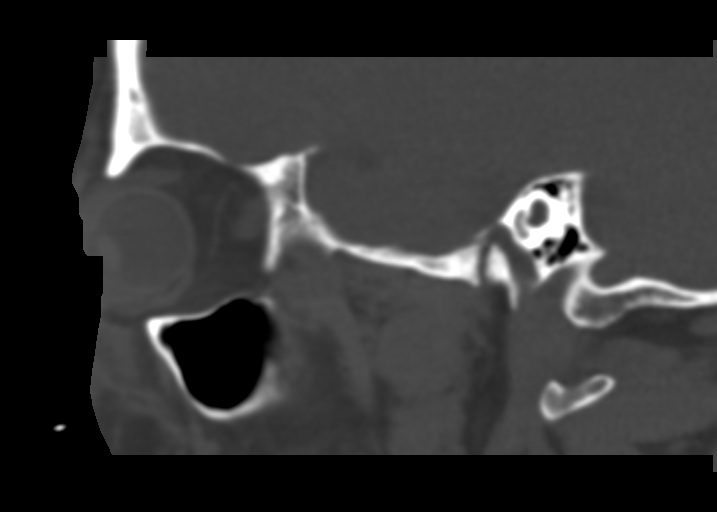
[im 44/87  bone]
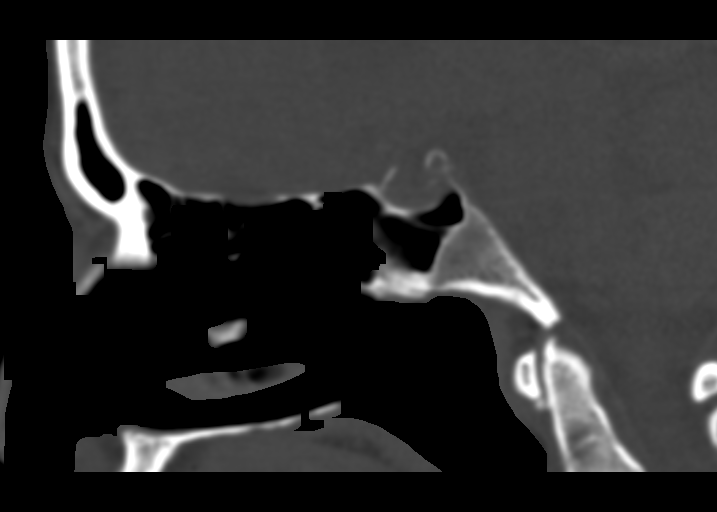
[im 58/87  bone]
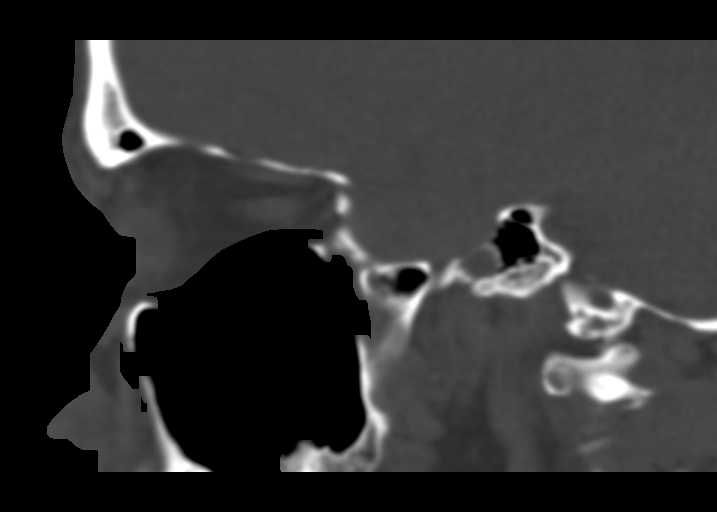

[14 of 47 positions shown; findings below may reference images not displayed]

FINDINGS: Evaluation of this exam is limited in the absence of intravenous
contrast.

Orbits:  The globes and retro-orbital fat are preserved.

Visualized sinuses: Clear.

Soft tissues: Mild soft tissue thickening of the right eyelid and
induration of the subcutaneous soft tissues of the right periorbital
region, likely represent cellulitis. No drainable fluid collection
or soft tissue gas. No radiopaque foreign object.

Limited intracranial: No significant or unexpected finding.
IMPRESSION: Mild soft tissue thickening of the right eyelid and subcutaneous
soft tissues of the right periorbital region likely represent
cellulitis. No drainable fluid collection or soft tissue gas.

## 2020-10-30 ENCOUNTER — Encounter: Payer: Self-pay | Admitting: Family Medicine

## 2020-10-30 ENCOUNTER — Ambulatory Visit: Payer: BLUE CROSS/BLUE SHIELD | Attending: Family Medicine | Admitting: Family Medicine

## 2020-10-30 ENCOUNTER — Other Ambulatory Visit: Payer: Self-pay

## 2020-10-30 VITALS — BP 140/88 | HR 106 | Ht 74.0 in | Wt 305.2 lb

## 2020-10-30 DIAGNOSIS — E1159 Type 2 diabetes mellitus with other circulatory complications: Secondary | ICD-10-CM | POA: Insufficient documentation

## 2020-10-30 DIAGNOSIS — E785 Hyperlipidemia, unspecified: Secondary | ICD-10-CM | POA: Insufficient documentation

## 2020-10-30 DIAGNOSIS — Z833 Family history of diabetes mellitus: Secondary | ICD-10-CM | POA: Insufficient documentation

## 2020-10-30 DIAGNOSIS — Z79899 Other long term (current) drug therapy: Secondary | ICD-10-CM | POA: Diagnosis not present

## 2020-10-30 DIAGNOSIS — Z7182 Exercise counseling: Secondary | ICD-10-CM | POA: Insufficient documentation

## 2020-10-30 DIAGNOSIS — Z713 Dietary counseling and surveillance: Secondary | ICD-10-CM | POA: Diagnosis not present

## 2020-10-30 DIAGNOSIS — E1169 Type 2 diabetes mellitus with other specified complication: Secondary | ICD-10-CM | POA: Diagnosis not present

## 2020-10-30 DIAGNOSIS — Z1211 Encounter for screening for malignant neoplasm of colon: Secondary | ICD-10-CM | POA: Diagnosis not present

## 2020-10-30 DIAGNOSIS — Z7901 Long term (current) use of anticoagulants: Secondary | ICD-10-CM | POA: Diagnosis not present

## 2020-10-30 DIAGNOSIS — I152 Hypertension secondary to endocrine disorders: Secondary | ICD-10-CM | POA: Diagnosis not present

## 2020-10-30 DIAGNOSIS — I10 Essential (primary) hypertension: Secondary | ICD-10-CM | POA: Diagnosis not present

## 2020-10-30 DIAGNOSIS — E11628 Type 2 diabetes mellitus with other skin complications: Secondary | ICD-10-CM | POA: Diagnosis not present

## 2020-10-30 DIAGNOSIS — B353 Tinea pedis: Secondary | ICD-10-CM

## 2020-10-30 LAB — POCT GLYCOSYLATED HEMOGLOBIN (HGB A1C): HbA1c, POC (controlled diabetic range): 5.8 % (ref 0.0–7.0)

## 2020-10-30 MED ORDER — ATORVASTATIN CALCIUM 20 MG PO TABS
ORAL_TABLET | Freq: Every day | ORAL | 1 refills | Status: DC
  Filled 2020-10-30: qty 90, 90d supply, fill #0
  Filled 2021-02-09: qty 90, 90d supply, fill #1

## 2020-10-30 MED ORDER — AMLODIPINE BESYLATE 10 MG PO TABS
ORAL_TABLET | Freq: Every day | ORAL | 1 refills | Status: DC
  Filled 2020-10-30: qty 90, 90d supply, fill #0
  Filled 2021-02-09: qty 90, 90d supply, fill #1

## 2020-10-30 MED ORDER — TERBINAFINE HCL 1 % EX CREA
1.0000 "application " | TOPICAL_CREAM | Freq: Two times a day (BID) | CUTANEOUS | 1 refills | Status: DC
  Filled 2020-10-30: qty 30, 15d supply, fill #0

## 2020-10-30 MED ORDER — LOSARTAN POTASSIUM-HCTZ 100-25 MG PO TABS
1.0000 | ORAL_TABLET | Freq: Every day | ORAL | 1 refills | Status: DC
  Filled 2020-10-30: qty 90, 90d supply, fill #0
  Filled 2021-02-09: qty 90, 90d supply, fill #1

## 2020-10-30 NOTE — Progress Notes (Signed)
Has not had any medication in 2 Months.

## 2020-10-30 NOTE — Progress Notes (Signed)
Subjective:  Patient ID: Seth Weaver, male    DOB: Jan 27, 1961  Age: 60 y.o. MRN: [id number]  CC: Diabetes   HPI [name]   is a 60 year old male with a history of type 2 diabetes mellitus (diet-controlled with A1c 5.8), hypertension, hyperlipidemia who presents for follow-up visit.   Interval History: Has been out of medications for 1-2 months. He has no neuropathy symptoms but has to wear reading glasses but no blurry visions though. He does not check his sugars at home. He has been compliant with his antihypertensive as well as his statin. He has no additional concerns today.  Past Medical History:  Diagnosis Date   Diabetes mellitus without complication ([place])    Essential hypertension 02/22/2014   Hyperlipidemia    Hypertension    Inguinal hernia 10/07/2014    Past Surgical History:  Procedure Laterality Date   HERNIA REPAIR      Family History  Problem Relation Age of Onset   Diabetes Father    Hypertension Father    Colon cancer Paternal Uncle    Colon polyps Neg Hx    Esophageal cancer Neg Hx    Rectal cancer Neg Hx    Stomach cancer Neg Hx     No Known Allergies  Outpatient Medications Prior to Visit  Medication Sig Dispense Refill   Blood Glucose Monitoring Suppl (BLOOD GLUCOSE METER KIT AND SUPPLIES) Dispense based on patient and insurance preference. Test blood sugar once daily as directed. (FOR ICD-9 250.00, 250.01). 1 each 0   glucose blood test strip Use as instructed 100 each 12   sildenafil (VIAGRA) 50 MG tablet TAKE 1 TABLET (50 MG TOTAL) BY MOUTH DAILY AS NEEDED FOR ERECTILE DYSFUNCTION. AT LEAST 24 HOURS BETWEEN DOSES 10 tablet 1   amLODipine (NORVASC) 10 MG tablet TAKE 1 TABLET (10 MG TOTAL) BY MOUTH DAILY. 90 tablet 1   atorvastatin (LIPITOR) 20 MG tablet TAKE 1 TABLET (20 MG TOTAL) BY MOUTH DAILY. 90 tablet 1   losartan-hydrochlorothiazide (HYZAAR) 100-25 MG tablet TAKE 1 TABLET BY MOUTH DAILY. 90 tablet 1   tobramycin (TOBREX) 0.3 %  ophthalmic solution Place 1 drop into the right eye every 4 (four) hours. (Patient not taking: Reported on 10/30/2020) 5 mL 0   cephALEXin (KEFLEX) 500 MG capsule Take 1 capsule (500 mg total) by mouth 4 (four) times daily. (Patient not taking: Reported on 10/30/2020) 40 capsule 0   doxycycline (VIBRA-TABS) 100 MG tablet Take 1 tablet (100 mg total) by mouth 2 (two) times daily. (Patient not taking: Reported on 10/30/2020) 20 tablet 0   Facility-Administered Medications Prior to Visit  Medication Dose Route Frequency Provider Last Rate Last Admin   0.9 %  sodium chloride infusion  500 mL Intravenous Once Nelida Meuse III, MD         ROS Review of Systems  Constitutional:  Negative for activity change and appetite change.  HENT:  Negative for sinus pressure and sore throat.   Eyes:  Negative for visual disturbance.  Respiratory:  Negative for cough, chest tightness and shortness of breath.   Cardiovascular:  Negative for chest pain and leg swelling.  Gastrointestinal:  Negative for abdominal distention, abdominal pain, constipation and diarrhea.  Endocrine: Negative.   Genitourinary:  Negative for dysuria.  Musculoskeletal:  Negative for joint swelling and myalgias.  Skin:  Negative for rash.  Allergic/Immunologic: Negative.   Neurological:  Negative for weakness, light-headedness and numbness.  Psychiatric/Behavioral:  Negative for dysphoric mood and suicidal ideas.  Objective:  BP 140/88   Pulse (!) 106   Ht $R'6\' 2"'rH$  (1.88 m)   Wt (!) 305 lb 3.2 oz (138.4 kg)   SpO2 99%   BMI 39.19 kg/m   BP/Weight 10/30/2020 70/04/6376 07/30/8500  Systolic BP 774 128 786  Diastolic BP 88 77 96  Wt. (Lbs) 305.2 314 309.8  BMI 39.19 40.32 39.78      Physical Exam Constitutional:      Appearance: He is well-developed. He is obese.  Cardiovascular:     Rate and Rhythm: Tachycardia present.     Heart sounds: Normal heart sounds. No murmur heard. Pulmonary:     Effort: Pulmonary effort is normal.      Breath sounds: Normal breath sounds. No wheezing or rales.  Chest:     Chest wall: No tenderness.  Abdominal:     General: Bowel sounds are normal. There is no distension.     Palpations: Abdomen is soft. There is no mass.     Tenderness: There is no abdominal tenderness.  Musculoskeletal:        General: Normal range of motion.     Right lower leg: No edema.     Left lower leg: No edema.  Neurological:     Mental Status: He is alert and oriented to person, place, and time.  Psychiatric:        Mood and Affect: Mood normal.   Diabetic Foot Exam - Simple   Simple Foot Form Diabetic Foot exam was performed with the following findings: Yes 10/30/2020  2:30 PM  Visual Inspection See comments: Yes Sensation Testing Intact to touch and monofilament testing bilaterally: Yes Pulse Check Posterior Tibialis and Dorsalis pulse intact bilaterally: Yes Comments Tinea pedis in the right fourth webspace.  Onychomycosis of left toenails.     CMP Latest Ref Rng & Units 01/25/2020 07/27/2019 02/04/2018  Glucose 65 - 99 mg/dL 105(H) 87 91  BUN 6 - 24 mg/dL $Remove'16 16 16  'dveWGjA$ Creatinine 0.76 - 1.27 mg/dL 1.12 1.02 1.29(H)  Sodium 134 - 144 mmol/L 140 142 140  Potassium 3.5 - 5.2 mmol/L 3.9 4.4 4.5  Chloride 96 - 106 mmol/L 102 107(H) 98  CO2 20 - 29 mmol/L $RemoveB'27 23 27  'dcWrlMAu$ Calcium 8.7 - 10.2 mg/dL 9.0 8.7 9.2  Total Protein 6.0 - 8.5 g/dL - 6.4 -  Total Bilirubin 0.0 - 1.2 mg/dL - 0.3 -  Alkaline Phos 39 - 117 IU/L - 65 -  AST 0 - 40 IU/L - 27 -  ALT 0 - 44 IU/L - 24 -    Lipid Panel     Component Value Date/Time   CHOL 179 02/04/2018 0909   TRIG 75 02/04/2018 0909   HDL 46 02/04/2018 0909   CHOLHDL 3.9 02/04/2018 0909   CHOLHDL 4.4 01/07/2014 0921   VLDL 21 01/07/2014 0921   LDLCALC 118 (H) 02/04/2018 0909    CBC    Component Value Date/Time   WBC 8.9 05/27/2016 1316   RBC 5.04 05/27/2016 1316   HGB 14.2 05/27/2016 1316   HCT 42.3 05/27/2016 1316   PLT 223 05/27/2016 1316   MCV 83.9  05/27/2016 1316   MCH 28.2 05/27/2016 1316   MCHC 33.6 05/27/2016 1316   RDW 15.3 05/27/2016 1316    Lab Results  Component Value Date   HGBA1C 5.8 10/30/2020    Assessment & Plan:  1. Controlled type 2 diabetes mellitus with other skin complication, without long-term current use of insulin ([place]) Diet Controlled  with A1c of 5.8 Counseled on Diabetic diet, my plate method, 924 minutes of moderate intensity exercise/week Blood sugar logs with fasting goals of 80-120 mg/dl, random of less than 180 and in the event of sugars less than 60 mg/dl or greater than 400 mg/dl encouraged to notify the clinic. Advised on the need for annual eye exams, annual foot exams, Pneumonia vaccine. - POCT glycosylated hemoglobin (Hb A1C) - CMP14+EGFR; Future - Lipid panel; Future - Microalbumin, urine; Future - Ambulatory referral to Ophthalmology - atorvastatin (LIPITOR) 20 MG tablet; TAKE 1 TABLET (20 MG TOTAL) BY MOUTH DAILY.  Dispense: 90 tablet; Refill: 1  2. Screening for colon cancer - Ambulatory referral to Gastroenterology  3. Hypertension associated with diabetes ([place]) Slightly above goal No regimen change today but will continue with lifestyle modifications Counseled on blood pressure goal of less than 130/80, low-sodium, DASH diet, medication compliance, 150 minutes of moderate intensity exercise per week. Discussed medication compliance, adverse effects. - amLODipine (NORVASC) 10 MG tablet; TAKE 1 TABLET (10 MG TOTAL) BY MOUTH DAILY.  Dispense: 90 tablet; Refill: 1 - losartan-hydrochlorothiazide (HYZAAR) 100-25 MG tablet; TAKE 1 TABLET BY MOUTH DAILY.  Dispense: 90 tablet; Refill: 1  4. Athlete's foot on right Counseled on diabetic foot care - terbinafine (LAMISIL AT) 1 % cream; apply cream to affected area(s) 2 (two) times daily.  Dispense: 30 g; Refill: 1   Health Care Maintenance: We will address needed vaccines at next visit. Meds ordered this encounter  Medications   amLODipine  (NORVASC) 10 MG tablet    Sig: TAKE 1 TABLET (10 MG TOTAL) BY MOUTH DAILY.    Dispense:  90 tablet    Refill:  1   atorvastatin (LIPITOR) 20 MG tablet    Sig: TAKE 1 TABLET (20 MG TOTAL) BY MOUTH DAILY.    Dispense:  90 tablet    Refill:  1   losartan-hydrochlorothiazide (HYZAAR) 100-25 MG tablet    Sig: TAKE 1 TABLET BY MOUTH DAILY.    Dispense:  90 tablet    Refill:  1   terbinafine (LAMISIL AT) 1 % cream    Sig: apply cream to affected area(s) 2 (two) times daily.    Dispense:  30 g    Refill:  1    Follow-up: Return in about 6 months (around 05/02/2021) for Medical conditions.       Charlott Rakes, MD, FAAFP. [facility], [place]   10/30/2020, 6:22 PM

## 2020-10-30 NOTE — Patient Instructions (Signed)
Athlete's Foot  Athlete's foot (tinea pedis) is a fungal infection of the skin on your feet. It often occurs on the skin that is between or underneath your toes. It can also occur on the soles of your feet. Symptoms include itchy or white and flaky areas on the skin. The infection can spread from person to person (is contagious). It can also spread when a person's bare feet come in contact with the funguson shower floors or on items such as shoes. Follow these instructions at home: Medicines Apply or take over-the-counter and prescription medicines only as told by your doctor. Apply your antifungal medicine as told by your doctor. Do not stop using the medicine even if your feet start to get better. Foot care Do not scratch your feet. Keep your feet dry: Wear cotton or wool socks. Change your socks every day or if they become wet. Wear shoes that allow air to move around, such as sandals or canvas tennis shoes. Wash and dry your feet: Every day or as told by your doctor. After exercising. Including the area between your toes. General instructions Do not share any of these items that touch your feet: Towels. Shoes. Nail clippers. Other personal items. Protect your feet by wearing sandals in wet areas, such as locker rooms and shared showers. Keep all follow-up visits as told by your doctor. This is important. If you have diabetes, keep your blood sugar under control. Contact a doctor if: You have a fever. You have swelling, pain, warmth, or redness in your foot. Your feet are not getting better with treatment. Your symptoms get worse. You have new symptoms. Summary Athlete's foot is a fungal infection of the skin on your feet. Symptoms include itchy or white and flaky areas on the skin. Apply your antifungal medicine as told by your doctor. Keep your feet clean and dry. This information is not intended to replace advice given to you by your health care provider. Make sure you  discuss any questions you have with your healthcare provider. Document Revised: 10/28/2019 Document Reviewed: 10/28/2019 Elsevier Patient Education  2022 [employer].

## 2020-11-06 ENCOUNTER — Ambulatory Visit: Payer: Medicaid Other | Attending: Family Medicine

## 2020-11-06 ENCOUNTER — Other Ambulatory Visit: Payer: Self-pay

## 2020-11-06 DIAGNOSIS — E11628 Type 2 diabetes mellitus with other skin complications: Secondary | ICD-10-CM

## 2020-11-07 LAB — CMP14+EGFR
ALT: 38 IU/L (ref 0–44)
AST: 30 IU/L (ref 0–40)
Albumin/Globulin Ratio: 1.4 (ref 1.2–2.2)
Albumin: 3.9 g/dL (ref 3.8–4.9)
Alkaline Phosphatase: 69 IU/L (ref 44–121)
BUN/Creatinine Ratio: 17 (ref 10–24)
BUN: 16 mg/dL (ref 8–27)
Bilirubin Total: 0.6 mg/dL (ref 0.0–1.2)
CO2: 25 mmol/L (ref 20–29)
Calcium: 8.9 mg/dL (ref 8.6–10.2)
Chloride: 101 mmol/L (ref 96–106)
Creatinine, Ser: 0.93 mg/dL (ref 0.76–1.27)
Globulin, Total: 2.8 g/dL (ref 1.5–4.5)
Glucose: 96 mg/dL (ref 65–99)
Potassium: 4.6 mmol/L (ref 3.5–5.2)
Sodium: 140 mmol/L (ref 134–144)
Total Protein: 6.7 g/dL (ref 6.0–8.5)
eGFR: 94 mL/min/{1.73_m2} (ref >59)

## 2020-11-07 LAB — LIPID PANEL
Chol/HDL Ratio: 2.7 ratio (ref 0.0–5.0)
Cholesterol, Total: 165 mg/dL (ref 100–199)
HDL: 62 mg/dL (ref >39)
LDL Chol Calc (NIH): 89 mg/dL (ref 0–99)
Triglycerides: 72 mg/dL (ref 0–149)
VLDL Cholesterol Cal: 14 mg/dL (ref 5–40)

## 2020-11-07 LAB — MICROALBUMIN, URINE: Microalbumin, Urine: 6.5 ug/mL

## 2020-11-17 ENCOUNTER — Telehealth: Payer: Self-pay

## 2020-11-17 NOTE — Telephone Encounter (Signed)
-----   Message from Charlott Rakes, MD sent at 11/08/2020  6:29 PM EDT ----- Please inform the patient that labs are normal. Thank you.

## 2020-11-17 NOTE — Telephone Encounter (Signed)
Patient has viewed results via mychart

## 2020-11-19 ENCOUNTER — Encounter: Payer: Self-pay | Admitting: Gastroenterology

## 2021-02-09 ENCOUNTER — Other Ambulatory Visit: Payer: Self-pay

## 2021-05-02 ENCOUNTER — Ambulatory Visit: Payer: Medicaid Other | Admitting: Family Medicine

## 2021-05-26 DIAGNOSIS — S80211A Abrasion, right knee, initial encounter: Secondary | ICD-10-CM | POA: Diagnosis not present

## 2021-05-26 DIAGNOSIS — M25511 Pain in right shoulder: Secondary | ICD-10-CM | POA: Diagnosis not present

## 2021-06-12 ENCOUNTER — Other Ambulatory Visit: Payer: Self-pay

## 2021-06-12 ENCOUNTER — Ambulatory Visit: Payer: Medicaid Other | Attending: Family Medicine | Admitting: Family Medicine

## 2021-06-12 ENCOUNTER — Encounter: Payer: Self-pay | Admitting: Family Medicine

## 2021-06-12 VITALS — BP 162/97 | HR 107 | Wt 315.6 lb

## 2021-06-12 DIAGNOSIS — E1159 Type 2 diabetes mellitus with other circulatory complications: Secondary | ICD-10-CM

## 2021-06-12 DIAGNOSIS — I152 Hypertension secondary to endocrine disorders: Secondary | ICD-10-CM

## 2021-06-12 DIAGNOSIS — E11628 Type 2 diabetes mellitus with other skin complications: Secondary | ICD-10-CM

## 2021-06-12 DIAGNOSIS — M25511 Pain in right shoulder: Secondary | ICD-10-CM

## 2021-06-12 LAB — POCT GLYCOSYLATED HEMOGLOBIN (HGB A1C): HbA1c, POC (controlled diabetic range): 6.4 % (ref 0.0–7.0)

## 2021-06-12 MED ORDER — OZEMPIC (0.25 OR 0.5 MG/DOSE) 2 MG/1.5ML ~~LOC~~ SOPN
0.2500 mg | PEN_INJECTOR | SUBCUTANEOUS | 3 refills | Status: DC
  Filled 2021-06-12: qty 1.5, 56d supply, fill #0

## 2021-06-12 MED ORDER — AMLODIPINE BESYLATE 10 MG PO TABS
ORAL_TABLET | Freq: Every day | ORAL | 1 refills | Status: DC
  Filled 2021-06-12: qty 90, 90d supply, fill #0
  Filled 2021-09-13: qty 90, 90d supply, fill #1

## 2021-06-12 MED ORDER — ATORVASTATIN CALCIUM 20 MG PO TABS
ORAL_TABLET | Freq: Every day | ORAL | 1 refills | Status: DC
  Filled 2021-06-12: qty 90, 90d supply, fill #0
  Filled 2021-09-13: qty 90, 90d supply, fill #1

## 2021-06-12 MED ORDER — CYCLOBENZAPRINE HCL 10 MG PO TABS
10.0000 mg | ORAL_TABLET | Freq: Two times a day (BID) | ORAL | 1 refills | Status: DC | PRN
Start: 2021-06-12 — End: 2022-10-01
  Filled 2021-06-12: qty 40, 20d supply, fill #0

## 2021-06-12 MED ORDER — LOSARTAN POTASSIUM-HCTZ 100-25 MG PO TABS
1.0000 | ORAL_TABLET | Freq: Every day | ORAL | 1 refills | Status: DC
Start: 2021-06-12 — End: 2021-09-18
  Filled 2021-06-12: qty 90, 90d supply, fill #0
  Filled 2021-09-13: qty 90, 90d supply, fill #1

## 2021-06-12 NOTE — Patient Instructions (Signed)
Please call to schedule your colonoscopy as the office has been trying to reach you. ?Placed in [place] [address] [place], [place] [address]  ?pH# [phone] ?

## 2021-06-12 NOTE — Progress Notes (Signed)
? ?Subjective:  ?Patient ID: Seth Weaver, male    DOB: [birth year]  Age: 61 y.o. MRN: [id number] ? ?CC: Hypertension ? ? ?HPI ?Seth Weaver is a 61 y.o. year old male with a history of type 2 diabetes mellitus (diet-controlled with A1c 6.4), hypertension, hyperlipidemia who presents for follow-up visit.  ? ?Interval History: ?He fell of a ramp 3 weeks ago while he was moving and hurt his R shoulder. Seen at [facility] and prescribed muscle relaxants and per patient xray was negative for fracture. Pain is 6/10 and has improved compared to time of trauma; he is R hand dominant. ?Currently using Ibuprofen for his symptoms. ? ?BP is elevated and he has been out of his antihypertensives and is requesting refill. ?A1c is 6.4 up from 5.8 previously.  Diabetes was previously diet controlled.  He does have some blurry vision and is not up-to-date on annual eye exams.  Denies presence of hypoglycemic symptoms or numbness in extremities. ?Past Medical History:  ?Diagnosis Date  ? Diabetes mellitus without complication ([place])   ? Essential hypertension 02/22/2014  ? Hyperlipidemia   ? Hypertension   ? Inguinal hernia 10/07/2014  ? ? ?Past Surgical History:  ?Procedure Laterality Date  ? HERNIA REPAIR    ? ? ?Family History  ?Problem Relation Age of Onset  ? Diabetes Father   ? Hypertension Father   ? Colon cancer Paternal Uncle   ? Colon polyps Neg Hx   ? Esophageal cancer Neg Hx   ? Rectal cancer Neg Hx   ? Stomach cancer Neg Hx   ? ? ?Social History  ? ?Socioeconomic History  ? Marital status: Married  ?  Spouse name: Not on file  ? Number of children: Not on file  ? Years of education: Not on file  ? Highest education level: Not on file  ?Occupational History  ? Not on file  ?Tobacco Use  ? Smoking status: Never  ? Smokeless tobacco: Never  ?Vaping Use  ? Vaping Use: Never used  ?Substance and Sexual Activity  ? Alcohol use: No  ?  Comment: Pt states he used to drink but quit approximately 2 months ago.   ? Drug use: No  ? Sexual  activity: Not on file  ?Other Topics Concern  ? Not on file  ?Social History Narrative  ? Not on file  ? ?Social Determinants of Health  ? ?Financial Resource Strain: Not on file  ?Food Insecurity: Not on file  ?Transportation Needs: Not on file  ?Physical Activity: Not on file  ?Stress: Not on file  ?Social Connections: Not on file  ? ? ?No Known Allergies ? ?Outpatient Medications Prior to Visit  ?Medication Sig Dispense Refill  ? Blood Glucose Monitoring Suppl (BLOOD GLUCOSE METER KIT AND SUPPLIES) Dispense based on patient and insurance preference. Test blood sugar once daily as directed. (FOR ICD-9 250.00, 250.01). 1 each 0  ? glucose blood test strip Use as instructed 100 each 12  ? terbinafine (LAMISIL AT) 1 % cream apply cream to affected area(s) 2 (two) times daily. 30 g 1  ? tobramycin (TOBREX) 0.3 % ophthalmic solution Place 1 drop into the right eye every 4 (four) hours. 5 mL 0  ? amLODipine (NORVASC) 10 MG tablet TAKE 1 TABLET (10 MG TOTAL) BY MOUTH DAILY. 90 tablet 1  ? atorvastatin (LIPITOR) 20 MG tablet TAKE 1 TABLET (20 MG TOTAL) BY MOUTH DAILY. 90 tablet 1  ? losartan-hydrochlorothiazide (HYZAAR) 100-25 MG tablet TAKE 1 TABLET BY MOUTH  DAILY. 90 tablet 1  ? sildenafil (VIAGRA) 50 MG tablet TAKE 1 TABLET (50 MG TOTAL) BY MOUTH DAILY AS NEEDED FOR ERECTILE DYSFUNCTION. AT LEAST 24 HOURS BETWEEN DOSES 10 tablet 1  ? ?Facility-Administered Medications Prior to Visit  ?Medication Dose Route Frequency Provider Last Rate Last Admin  ? 0.9 %  sodium chloride infusion  500 mL Intravenous Once Doran Stabler, MD      ? ? ? ?ROS ?Review of Systems  ?Constitutional:  Negative for activity change and appetite change.  ?HENT:  Negative for sinus pressure and sore throat.   ?Eyes:  Negative for visual disturbance.  ?Respiratory:  Negative for cough, chest tightness and shortness of breath.   ?Cardiovascular:  Negative for chest pain and leg swelling.  ?Gastrointestinal:  Negative for abdominal distention,  abdominal pain, constipation and diarrhea.  ?Endocrine: Negative.   ?Genitourinary:  Negative for dysuria.  ?Musculoskeletal:   ?     See HPI  ?Skin:  Negative for rash.  ?Allergic/Immunologic: Negative.   ?Neurological:  Negative for weakness, light-headedness and numbness.  ?Psychiatric/Behavioral:  Negative for dysphoric mood and suicidal ideas.   ?Objective:  ?BP (!) 162/97   Pulse (!) 107   Wt (!) 315 lb 9.6 oz (143.2 kg)   SpO2 98%   BMI 40.52 kg/m?  ? ?BP/Weight 06/12/2021 10/30/2020 01/25/2020  ?Systolic BP 503 546 568  ?Diastolic BP 97 88 77  ?Wt. (Lbs) 315.6 305.2 314  ?BMI 40.52 39.19 40.32  ? ? ? ? ?Physical Exam ?Constitutional:   ?   Appearance: He is well-developed.  ?Cardiovascular:  ?   Rate and Rhythm: Normal rate.  ?   Heart sounds: Normal heart sounds. No murmur heard. ?Pulmonary:  ?   Effort: Pulmonary effort is normal.  ?   Breath sounds: Normal breath sounds. No wheezing or rales.  ?Chest:  ?   Chest wall: No tenderness.  ?Abdominal:  ?   General: Bowel sounds are normal. There is no distension.  ?   Palpations: Abdomen is soft. There is no mass.  ?   Tenderness: There is no abdominal tenderness.  ?Musculoskeletal:  ?   Right lower leg: No edema.  ?   Left lower leg: No edema.  ?   Comments: Tenderness on palpation of lateral aspect of right arm.  Active abduction restricted to 90 degrees ?Normal handgrip bilaterally  ?Neurological:  ?   Mental Status: He is alert and oriented to person, place, and time.  ?Psychiatric:     ?   Mood and Affect: Mood normal.  ? ? ?CMP Latest Ref Rng & Units 11/06/2020 01/25/2020 07/27/2019  ?Glucose 65 - 99 mg/dL 96 105(H) 87  ?BUN 8 - 27 mg/dL $Remove'16 16 16  'fxFWDYe$ ?Creatinine 0.76 - 1.27 mg/dL 0.93 1.12 1.02  ?Sodium 134 - 144 mmol/L 140 140 142  ?Potassium 3.5 - 5.2 mmol/L 4.6 3.9 4.4  ?Chloride 96 - 106 mmol/L 101 102 107(H)  ?CO2 20 - 29 mmol/L $RemoveB'25 27 23  'kWTRRVKU$ ?Calcium 8.6 - 10.2 mg/dL 8.9 9.0 8.7  ?Total Protein 6.0 - 8.5 g/dL 6.7 - 6.4  ?Total Bilirubin 0.0 - 1.2 mg/dL 0.6 -  0.3  ?Alkaline Phos 44 - 121 IU/L 69 - 65  ?AST 0 - 40 IU/L 30 - 27  ?ALT 0 - 44 IU/L 38 - 24  ? ? ?Lipid Panel  ?   ?Component Value Date/Time  ? CHOL 165 11/06/2020 0841  ? TRIG 72 11/06/2020 0841  ?  HDL 62 11/06/2020 0841  ? CHOLHDL 2.7 11/06/2020 0841  ? CHOLHDL 4.4 01/07/2014 0921  ? VLDL 21 01/07/2014 0921  ? LDLCALC 89 11/06/2020 0841  ? ? ?CBC ?   ?Component Value Date/Time  ? WBC 8.9 05/27/2016 1316  ? RBC 5.04 05/27/2016 1316  ? HGB 14.2 05/27/2016 1316  ? HCT 42.3 05/27/2016 1316  ? PLT 223 05/27/2016 1316  ? MCV 83.9 05/27/2016 1316  ? MCH 28.2 05/27/2016 1316  ? MCHC 33.6 05/27/2016 1316  ? RDW 15.3 05/27/2016 1316  ? ? ?Lab Results  ?Component Value Date  ? HGBA1C 6.4 06/12/2021  ? ? ?Assessment & Plan:  ?1. Controlled type 2 diabetes mellitus with other skin complication, without long-term current use of insulin ([place]) ?Controlled with A1c of 6.4 but this has trended up from 5.8 previously ?We will place on GLP-1 receptor agonist due to additional weight loss and cardiovascular benefits ?Clinical pharmacist called in to provide education on administration ?Counseled on Diabetic diet, my plate method, 935 minutes of moderate intensity exercise/week ?Blood sugar logs with fasting goals of 80-120 mg/dl, random of less than 180 and in the event of sugars less than 60 mg/dl or greater than 400 mg/dl encouraged to notify the clinic. ?Advised on the need for annual eye exams, annual foot exams, Pneumonia vaccine. ?- POCT glycosylated hemoglobin (Hb A1C) ?- atorvastatin (LIPITOR) 20 MG tablet; TAKE 1 TABLET (20 MG TOTAL) BY MOUTH DAILY.  Dispense: 90 tablet; Refill: 1 ?- Semaglutide,0.25 or 0.5MG /DOS, (OZEMPIC, 0.25 OR 0.5 MG/DOSE,) 2 MG/1.5ML SOPN; Inject 0.25 mg into the skin once a week.  Dispense: 2 mL; Refill: 3 ? ?2. Acute pain of right shoulder ?Secondary to trauma ?Symptoms are improving per patient ?Would love to refer for PT but he declines and would like to work on exercises at home ?-  cyclobenzaprine (FLEXERIL) 10 MG tablet; Take 1 tablet (10 mg total) by mouth 2 (two) times daily as needed for muscle spasms.  Dispense: 40 tablet; Refill: 1 ? ?3. Hypertension associated with diabetes ([place]) ?Uncontrolled du

## 2021-06-13 ENCOUNTER — Other Ambulatory Visit: Payer: Self-pay

## 2021-06-13 LAB — CMP14+EGFR
ALT: 17 IU/L (ref 0–44)
AST: 21 IU/L (ref 0–40)
Albumin/Globulin Ratio: 1.2 (ref 1.2–2.2)
Albumin: 3.6 g/dL — ABNORMAL LOW (ref 3.8–4.9)
Alkaline Phosphatase: 77 IU/L (ref 44–121)
BUN/Creatinine Ratio: 12 (ref 10–24)
BUN: 12 mg/dL (ref 8–27)
Bilirubin Total: 0.2 mg/dL (ref 0.0–1.2)
CO2: 24 mmol/L (ref 20–29)
Calcium: 8.8 mg/dL (ref 8.6–10.2)
Chloride: 104 mmol/L (ref 96–106)
Creatinine, Ser: 1.01 mg/dL (ref 0.76–1.27)
Globulin, Total: 3 g/dL (ref 1.5–4.5)
Glucose: 93 mg/dL (ref 70–99)
Potassium: 4.4 mmol/L (ref 3.5–5.2)
Sodium: 141 mmol/L (ref 134–144)
Total Protein: 6.6 g/dL (ref 6.0–8.5)
eGFR: 85 mL/min/{1.73_m2} (ref >59)

## 2021-06-15 ENCOUNTER — Telehealth: Payer: Self-pay

## 2021-06-15 NOTE — Telephone Encounter (Signed)
PA for Ozempic has been approved until 06/14/22 ?

## 2021-06-18 ENCOUNTER — Encounter: Payer: Self-pay | Admitting: Gastroenterology

## 2021-07-10 ENCOUNTER — Ambulatory Visit (AMBULATORY_SURGERY_CENTER): Payer: Medicaid Other | Admitting: *Deleted

## 2021-07-10 ENCOUNTER — Other Ambulatory Visit: Payer: Self-pay

## 2021-07-10 VITALS — Ht 74.0 in | Wt 305.0 lb

## 2021-07-10 DIAGNOSIS — Z860100 Personal history of colon polyps, unspecified: Secondary | ICD-10-CM

## 2021-07-10 MED ORDER — NA SULFATE-K SULFATE-MG SULF 17.5-3.13-1.6 GM/177ML PO SOLN
2.0000 | Freq: Once | ORAL | 0 refills | Status: AC
  Filled 2021-07-10: qty 354, 1d supply, fill #0

## 2021-07-10 NOTE — Progress Notes (Signed)
No egg or soy allergy known to patient  ?No issues known to pt with past sedation with any surgeries or procedures ?Patient denies ever being told they had issues or difficulty with intubation  ?No FH of Malignant Hyperthermia ?Pt is not on diet pills ?Pt is not on  home 02  ?Pt is not on blood thinners  ?Pt denies issues with constipation  ?No A fib or A flutter  ? ? ?PV completed over the phone. Pt verified name, DOB, address and insurance during PV today.  ?Pt mailed instruction packet with copy of consent form to read and not return, and instructions.  ?Pt encouraged to call with questions or issues.  ?If pt has My chart, procedure instructions sent via My Chart  ? ?

## 2021-07-11 ENCOUNTER — Other Ambulatory Visit: Payer: Self-pay

## 2021-07-16 ENCOUNTER — Other Ambulatory Visit: Payer: Self-pay

## 2021-07-19 ENCOUNTER — Telehealth: Payer: Self-pay | Admitting: Pharmacist

## 2021-07-19 NOTE — Telephone Encounter (Signed)
Patient appearing on report for [employer] - Hypertension Control report due to last documented ambulatory blood pressure of 162/97 on 06/12/21. Next appointment with PCP is 09/18/21.  ? ?Outreached patient to discuss hypertension control and medication management. Left voicemail for him to return my call at his convenience.  ? ?Catie Hedwig Morton, PharmD, BCACP ?[place] ?[phone] ? ? ?

## 2021-07-24 ENCOUNTER — Ambulatory Visit (AMBULATORY_SURGERY_CENTER): Payer: BC Managed Care – PPO | Admitting: Gastroenterology

## 2021-07-24 ENCOUNTER — Encounter: Payer: Self-pay | Admitting: Gastroenterology

## 2021-07-24 VITALS — BP 136/75 | HR 78 | Temp 97.8°F | Resp 15 | Ht 74.0 in | Wt 305.0 lb

## 2021-07-24 DIAGNOSIS — Z860100 Personal history of colon polyps, unspecified: Secondary | ICD-10-CM

## 2021-07-24 DIAGNOSIS — D123 Benign neoplasm of transverse colon: Secondary | ICD-10-CM | POA: Diagnosis not present

## 2021-07-24 DIAGNOSIS — Z1211 Encounter for screening for malignant neoplasm of colon: Secondary | ICD-10-CM | POA: Diagnosis not present

## 2021-07-24 MED ORDER — SODIUM CHLORIDE 0.9 % IV SOLN: 500.0000 mL | Freq: Once | INTRAVENOUS | Status: DC

## 2021-07-24 NOTE — Op Note (Signed)
[facility] ?Patient Name: Seth Weaver ?Procedure Date: 07/24/2021 8:50 AM ?MRN: [id number] ?Endoscopist: Estill Cotta. Loletha Carrow , MD ?Age: 61 ?Referring MD:  ?Date of Birth: July 06, 1960 ?Gender: Male ?Account #: [id number] ?Procedure:                Colonoscopy ?Indications:              Surveillance: Personal history of adenomatous  ?                          polyps on last colonoscopy > 3 years ago ?                          TA x 2 (one > 42m) on first screening colonoscopy  ?                          May 2019 ?Medicines:                Monitored Anesthesia Care ?Procedure:                Pre-Anesthesia Assessment: ?                          - Prior to the procedure, a History and Physical  ?                          was performed, and patient medications and  ?                          allergies were reviewed. The patient's tolerance of  ?                          previous anesthesia was also reviewed. The risks  ?                          and benefits of the procedure and the sedation  ?                          options and risks were discussed with the patient.  ?                          All questions were answered, and informed consent  ?                          was obtained. Prior Anticoagulants: The patient has  ?                          taken no previous anticoagulant or antiplatelet  ?                          agents. ASA Grade Assessment: III - A patient with  ?                          severe systemic disease. After reviewing the risks  ?  and benefits, the patient was deemed in  ?                          satisfactory condition to undergo the procedure. ?                          After obtaining informed consent, the colonoscope  ?                          was passed under direct vision. Throughout the  ?                          procedure, the patient's blood pressure, pulse, and  ?                          oxygen saturations were monitored continuously. The  ?                           CF [id number] #[id number] was introduced through the anus  ?                          and advanced to the the cecum, identified by  ?                          appendiceal orifice and ileocecal valve. The  ?                          colonoscopy was performed without difficulty. The  ?                          patient tolerated the procedure well. The quality  ?                          of the bowel preparation was good in some areas,  ?                          fair in others despite lavage. The ileocecal valve,  ?                          appendiceal orifice, and rectum were photographed.  ?                          The bowel preparation used was SUPREP. ?Scope In: 9:07:51 AM ?Scope Out: 9:25:07 AM ?Scope Withdrawal Time: 0 hours 12 minutes 58 seconds  ?Total Procedure Duration: 0 hours 17 minutes 16 seconds  ?Findings:                 The perianal and digital rectal examinations were  ?                          normal. [Pertinent Negatives]. ?                          Repeat examination of right colon under NBI  ?  performed. ?                          A diminutive polyp was found in the transverse  ?                          colon. The polyp was sessile. The polyp was removed  ?                          with a cold snare. Resection and retrieval were  ?                          complete. ?                          Multiple diverticula were found in the left colon  ?                          and right colon. ?                          Internal hemorrhoids were found. ?                          The exam was otherwise without abnormality on  ?                          direct and retroflexion views. ?Complications:            No immediate complications. ?Estimated Blood Loss:     Estimated blood loss was minimal. ?Impression:               - One diminutive polyp in the transverse colon,  ?                          removed with a cold snare. Resected and retrieved. ?                           - Diverticulosis in the left colon and in the right  ?                          colon. ?                          - Internal hemorrhoids. ?                          - The examination was otherwise normal on direct  ?                          and retroflexion views. ?Recommendation:           - Patient has a contact number available for  ?                          emergencies. The signs and symptoms of potential  ?  delayed complications were discussed with the  ?                          patient. Return to normal activities tomorrow.  ?                          Written discharge instructions were provided to the  ?                          patient. ?                          - Resume previous diet. ?                          - Continue present medications. ?                          - Await pathology results. ?                          - Repeat colonoscopy in 3 years for surveillance.  ?                          (split dose PEG prep for next exam.) ?Sheron Robin L. Loletha Carrow, MD ?07/24/2021 9:29:05 AM ?This report has been signed electronically. ?

## 2021-07-24 NOTE — Progress Notes (Signed)
VS-CW  Pt's states no medical or surgical changes since previsit or office visit.  

## 2021-07-24 NOTE — Progress Notes (Signed)
Called to room to assist during endoscopic procedure.  Patient ID and intended procedure confirmed with present staff. Received instructions for my participation in the procedure from the performing physician.  

## 2021-07-24 NOTE — Patient Instructions (Signed)
Please read handouts provided. ?Continue present medications. ?Await pathology results. ?Recommending repeat colonoscopy in 3 years for screening. ? ? ?YOU HAD AN ENDOSCOPIC PROCEDURE TODAY AT [facility]:   Refer to the procedure report that was given to you for any specific questions about what was found during the examination.  If the procedure report does not answer your questions, please call your gastroenterologist to clarify.  If you requested that your care partner not be given the details of your procedure findings, then the procedure report has been included in a sealed envelope for you to review at your convenience later. ? ?YOU SHOULD EXPECT: Some feelings of bloating in the abdomen. Passage of more gas than usual.  Walking can help get rid of the air that was put into your GI tract during the procedure and reduce the bloating. If you had a lower endoscopy (such as a colonoscopy or flexible sigmoidoscopy) you may notice spotting of blood in your stool or on the toilet paper. If you underwent a bowel prep for your procedure, you may not have a normal bowel movement for a few days. ? ?Please Note:  You might notice some irritation and congestion in your nose or some drainage.  This is from the oxygen used during your procedure.  There is no need for concern and it should clear up in a day or so. ? ?SYMPTOMS TO REPORT IMMEDIATELY: ? ?Following lower endoscopy (colonoscopy or flexible sigmoidoscopy): ? Excessive amounts of blood in the stool ? Significant tenderness or worsening of abdominal pains ? Swelling of the abdomen that is new, acute ? Fever of 100?F or higher ? ? ?For urgent or emergent issues, a gastroenterologist can be reached at any hour by calling [phone]. ?Do not use MyChart messaging for urgent concerns.  ? ? ?DIET:  We do recommend a small meal at first, but then you may proceed to your regular diet.  Drink plenty of fluids but you should avoid alcoholic beverages for  24 hours. ? ?ACTIVITY:  You should plan to take it easy for the rest of today and you should NOT DRIVE or use heavy machinery until tomorrow (because of the sedation medicines used during the test).   ? ?FOLLOW UP: ?Our staff will call the number listed on your records 48-72 hours following your procedure to check on you and address any questions or concerns that you may have regarding the information given to you following your procedure. If we do not reach you, we will leave a message.  We will attempt to reach you two times.  During this call, we will ask if you have developed any symptoms of COVID 19. If you develop any symptoms (ie: fever, flu-like symptoms, shortness of breath, cough etc.) before then, please call [phone].  If you test positive for Covid 19 in the 2 weeks post procedure, please call and report this information to [place].   ? ?If any biopsies were taken you will be contacted by phone or by letter within the next 1-3 weeks.  Please call us at [phone] if you have not heard about the biopsies in 3 weeks.  ? ? ?SIGNATURES/CONFIDENTIALITY: ?You and/or your care partner have signed paperwork which will be entered into your electronic medical record.  These signatures attest to the fact that that the information above on your After Visit Summary has been reviewed and is understood.  Full responsibility of the confidentiality of this discharge information lies with you and/or your  care-partner.  ?

## 2021-07-24 NOTE — Progress Notes (Signed)
PT taken to PACU. Monitors in place. VSS. Report given to RN. 

## 2021-07-24 NOTE — Progress Notes (Signed)
History and Physical: ? This patient presents for endoscopic testing for: ?Encounter Diagnosis  ?Name Primary?  ? Personal history of colonic polyps Yes  ? ? ?TA polyps last colonoscopy May 2019 ?Patient denies chronic abdominal pain, rectal bleeding, constipation or diarrhea. ? ? ?Patient is otherwise without complaints or active issues today. ? ? ?Past Medical History: ?Past Medical History:  ?Diagnosis Date  ? Diabetes mellitus without complication ([place])   ? Essential hypertension 02/22/2014  ? Hyperlipidemia   ? Hypertension   ? Inguinal hernia 10/07/2014  ? ? ? ?Past Surgical History: ?Past Surgical History:  ?Procedure Laterality Date  ? HERNIA REPAIR    ? ? ?Allergies: ?No Known Allergies ? ?Outpatient Meds: ?Current Outpatient Medications  ?Medication Sig Dispense Refill  ? amLODipine (NORVASC) 10 MG tablet TAKE 1 TABLET (10 MG TOTAL) BY MOUTH DAILY. 90 tablet 1  ? atorvastatin (LIPITOR) 20 MG tablet TAKE 1 TABLET (20 MG TOTAL) BY MOUTH DAILY. 90 tablet 1  ? Blood Glucose Monitoring Suppl (BLOOD GLUCOSE METER KIT AND SUPPLIES) Dispense based on patient and insurance preference. Test blood sugar once daily as directed. (FOR ICD-9 250.00, 250.01). 1 each 0  ? glucose blood test strip Use as instructed 100 each 12  ? losartan-hydrochlorothiazide (HYZAAR) 100-25 MG tablet TAKE 1 TABLET BY MOUTH DAILY. 90 tablet 1  ? cyclobenzaprine (FLEXERIL) 10 MG tablet Take 1 tablet (10 mg total) by mouth 2 (two) times daily as needed for muscle spasms. (Patient not taking: Reported on 07/10/2021) 40 tablet 1  ? Semaglutide,0.25 or 0.5MG /DOS, (OZEMPIC, 0.25 OR 0.5 MG/DOSE,) 2 MG/1.5ML SOPN Inject 0.25 mg into the skin once a week. (Patient not taking: Reported on 07/10/2021) 2 mL 3  ? sildenafil (VIAGRA) 50 MG tablet TAKE 1 TABLET (50 MG TOTAL) BY MOUTH DAILY AS NEEDED FOR ERECTILE DYSFUNCTION. AT LEAST 24 HOURS BETWEEN DOSES (Patient not taking: Reported on 07/24/2021) 10 tablet 1  ? terbinafine (LAMISIL AT) 1 % cream apply cream  to affected area(s) 2 (two) times daily. (Patient not taking: Reported on 07/10/2021) 30 g 1  ? tobramycin (TOBREX) 0.3 % ophthalmic solution Place 1 drop into the right eye every 4 (four) hours. (Patient not taking: Reported on 07/10/2021) 5 mL 0  ? ?Current Facility-Administered Medications  ?Medication Dose Route Frequency Provider Last Rate Last Admin  ? 0.9 %  sodium chloride infusion  500 mL Intravenous Once Nelida Meuse III, MD      ? 0.9 %  sodium chloride infusion  500 mL Intravenous Once Doran Stabler, MD      ? ? ? ? ?___________________________________________________________________ ?Objective  ? ?Exam: ? ?BP (!) 107/50   Pulse 86   Temp 97.8 ?F (36.6 ?C)   Ht 6\' 2"  (1.88 m)   Wt (!) 305 lb (138.3 kg)   SpO2 98%   BMI 39.16 kg/m?  ? ?CV: RRR without murmur, S1/S2 ?Resp: clear to auscultation bilaterally, normal RR and effort noted ?GI: soft, no tenderness, with active bowel sounds. ? ? ?Assessment: ?Encounter Diagnosis  ?Name Primary?  ? Personal history of colonic polyps Yes  ? ? ? ?Plan: ?Colonoscopy ? The benefits and risks of the planned procedure were described in detail with the patient or (when appropriate) their health care proxy.  Risks were outlined as including, but not limited to, bleeding, infection, perforation, adverse medication reaction leading to cardiac or pulmonary decompensation, pancreatitis (if ERCP).  The limitation of incomplete mucosal visualization was also discussed.  No guarantees or  warranties were given. ? ? ? ?The patient is appropriate for an endoscopic procedure in the ambulatory setting. ? ? - Wilfrid Lund, MD ? ? ? ? ?

## 2021-07-26 ENCOUNTER — Telehealth: Payer: Self-pay

## 2021-07-26 NOTE — Telephone Encounter (Signed)
?  Follow up Call- ? ? ?  07/24/2021  ?  8:35 AM  ?Call back number  ?Post procedure Call Back phone  # [phone]  ?Permission to leave phone message Yes  ?  ?Post op call attempted, no answer, left WM.  ?

## 2021-07-30 ENCOUNTER — Encounter: Payer: Self-pay | Admitting: Gastroenterology

## 2021-07-31 ENCOUNTER — Telehealth: Payer: Self-pay | Admitting: Pharmacist

## 2021-07-31 NOTE — Telephone Encounter (Signed)
Patient outreached by Barnet Pall, PharmD Candidate regarding hypertension. Left voicemail for patient to return our call at his convenience.  ? ?Catie Hedwig Morton, PharmD, BCACP ?[place] ?[phone] ? ?

## 2021-09-13 ENCOUNTER — Other Ambulatory Visit: Payer: Self-pay

## 2021-09-18 ENCOUNTER — Encounter: Payer: Self-pay | Admitting: Family Medicine

## 2021-09-18 ENCOUNTER — Ambulatory Visit: Payer: BC Managed Care – PPO | Attending: Family Medicine | Admitting: Family Medicine

## 2021-09-18 ENCOUNTER — Other Ambulatory Visit: Payer: Self-pay

## 2021-09-18 VITALS — BP 122/77 | HR 104 | Temp 98.6°F | Ht 74.0 in | Wt 293.2 lb

## 2021-09-18 DIAGNOSIS — M25511 Pain in right shoulder: Secondary | ICD-10-CM | POA: Diagnosis not present

## 2021-09-18 DIAGNOSIS — G8929 Other chronic pain: Secondary | ICD-10-CM

## 2021-09-18 DIAGNOSIS — E11628 Type 2 diabetes mellitus with other skin complications: Secondary | ICD-10-CM | POA: Diagnosis not present

## 2021-09-18 DIAGNOSIS — E1159 Type 2 diabetes mellitus with other circulatory complications: Secondary | ICD-10-CM | POA: Diagnosis not present

## 2021-09-18 DIAGNOSIS — I152 Hypertension secondary to endocrine disorders: Secondary | ICD-10-CM | POA: Diagnosis not present

## 2021-09-18 LAB — POCT GLYCOSYLATED HEMOGLOBIN (HGB A1C): HbA1c, POC (controlled diabetic range): 6.2 % (ref 0.0–7.0)

## 2021-09-18 LAB — GLUCOSE, POCT (MANUAL RESULT ENTRY): POC Glucose: 115 mg/dl — AB (ref 70–99)

## 2021-09-18 MED ORDER — IBUPROFEN 600 MG PO TABS
600.0000 mg | ORAL_TABLET | Freq: Two times a day (BID) | ORAL | 1 refills | Status: DC | PRN
  Filled 2021-09-18: qty 60, 30d supply, fill #0

## 2021-09-18 MED ORDER — LOSARTAN POTASSIUM-HCTZ 100-25 MG PO TABS
1.0000 | ORAL_TABLET | Freq: Every day | ORAL | 1 refills | Status: DC
  Filled 2021-09-18 – 2021-12-24 (×3): qty 90, 90d supply, fill #0

## 2021-09-18 MED ORDER — ATORVASTATIN CALCIUM 20 MG PO TABS
20.0000 mg | ORAL_TABLET | Freq: Every day | ORAL | 1 refills | Status: DC
  Filled 2021-09-18: qty 90, fill #0
  Filled 2021-12-24: qty 90, 90d supply, fill #0

## 2021-09-18 MED ORDER — AMLODIPINE BESYLATE 10 MG PO TABS
10.0000 mg | ORAL_TABLET | Freq: Every day | ORAL | 1 refills | Status: DC
  Filled 2021-09-18: qty 90, fill #0
  Filled 2021-12-24: qty 90, 90d supply, fill #0

## 2021-09-18 NOTE — Progress Notes (Signed)
Subjective:  Patient ID: Seth Weaver, male    DOB: November 26, 1960  Age: 61 y.o. MRN: [id number]  CC: Diabetes   HPI Seth Weaver is a 61 y.o. year old male with a history of type 2 diabetes mellitus (diet-controlled with A1c 6.2), hypertension, hyperlipidemia who presents for follow-up visit.   Interval History: His R shoulder still hurts after his fall 3 months ago. At work he does a lot of lifting.  He endorses restriction with regards to his range of motion.  He has no numbness or tingling.  He is right-hand dominant.  A1c is 6.2 down from 6.4 previously.  He has been walking a lot, cutting out breads and potatoes from his diet. He never started Ozempic which was prescribed at his last visit as he wanted to work on his lifestyle. Past Medical History:  Diagnosis Date   Diabetes mellitus without complication ([place])    Essential hypertension 02/22/2014   Hyperlipidemia    Hypertension    Inguinal hernia 10/07/2014    Past Surgical History:  Procedure Laterality Date   HERNIA REPAIR      Family History  Problem Relation Age of Onset   Diabetes Father    Hypertension Father    Colon cancer Paternal Uncle    Colon polyps Neg Hx    Esophageal cancer Neg Hx    Rectal cancer Neg Hx    Stomach cancer Neg Hx     Social History   Socioeconomic History   Marital status: Married    Spouse name: Not on file   Number of children: Not on file   Years of education: Not on file   Highest education level: Not on file  Occupational History   Not on file  Tobacco Use   Smoking status: Never   Smokeless tobacco: Never  Vaping Use   Vaping Use: Never used  Substance and Sexual Activity   Alcohol use: No    Comment: Pt states he used to drink but quit approximately 2 months ago.    Drug use: No   Sexual activity: Not on file  Other Topics Concern   Not on file  Social History Narrative   Not on file   Social Determinants of Health   Financial Resource Strain: Not on file   Food Insecurity: Not on file  Transportation Needs: Not on file  Physical Activity: Not on file  Stress: Not on file  Social Connections: Not on file    No Known Allergies  Outpatient Medications Prior to Visit  Medication Sig Dispense Refill   amLODipine (NORVASC) 10 MG tablet TAKE 1 TABLET (10 MG TOTAL) BY MOUTH DAILY. 90 tablet 1   atorvastatin (LIPITOR) 20 MG tablet TAKE 1 TABLET (20 MG TOTAL) BY MOUTH DAILY. 90 tablet 1   Blood Glucose Monitoring Suppl (BLOOD GLUCOSE METER KIT AND SUPPLIES) Dispense based on patient and insurance preference. Test blood sugar once daily as directed. (FOR ICD-9 250.00, 250.01). 1 each 0   cyclobenzaprine (FLEXERIL) 10 MG tablet Take 1 tablet (10 mg total) by mouth 2 (two) times daily as needed for muscle spasms. 40 tablet 1   glucose blood test strip Use as instructed 100 each 12   losartan-hydrochlorothiazide (HYZAAR) 100-25 MG tablet TAKE 1 TABLET BY MOUTH DAILY. 90 tablet 1   Semaglutide,0.25 or 0.5MG /DOS, (OZEMPIC, 0.25 OR 0.5 MG/DOSE,) 2 MG/1.5ML SOPN Inject 0.25 mg into the skin once a week. 2 mL 3   terbinafine (LAMISIL AT) 1 % cream apply cream  to affected area(s) 2 (two) times daily. 30 g 1   tobramycin (TOBREX) 0.3 % ophthalmic solution Place 1 drop into the right eye every 4 (four) hours. 5 mL 0   sildenafil (VIAGRA) 50 MG tablet TAKE 1 TABLET (50 MG TOTAL) BY MOUTH DAILY AS NEEDED FOR ERECTILE DYSFUNCTION. AT LEAST 24 HOURS BETWEEN DOSES (Patient not taking: Reported on 07/24/2021) 10 tablet 1   Facility-Administered Medications Prior to Visit  Medication Dose Route Frequency Provider Last Rate Last Admin   0.9 %  sodium chloride infusion  500 mL Intravenous Once Danis, Estill Cotta III, MD       0.9 %  sodium chloride infusion  500 mL Intravenous Once Nelida Meuse III, MD         ROS Review of Systems  Constitutional:  Negative for activity change and appetite change.  HENT:  Negative for sinus pressure and sore throat.   Eyes:  Negative  for visual disturbance.  Respiratory:  Negative for cough, chest tightness and shortness of breath.   Cardiovascular:  Negative for chest pain and leg swelling.  Gastrointestinal:  Negative for abdominal distention, abdominal pain, constipation and diarrhea.  Endocrine: Negative.   Genitourinary:  Negative for dysuria.  Musculoskeletal:        See HPI  Skin:  Negative for rash.  Allergic/Immunologic: Negative.   Neurological:  Negative for weakness, light-headedness and numbness.  Psychiatric/Behavioral:  Negative for dysphoric mood and suicidal ideas.     Objective:  BP 122/77   Pulse (!) 104   Temp 98.6 F (37 C) (Oral)   Ht $R'6\' 2"'IY$  (1.88 m)   Wt 293 lb 3.2 oz (133 kg)   SpO2 97%   BMI 37.64 kg/m      09/18/2021    2:30 PM 07/24/2021    9:49 AM 07/24/2021    9:39 AM  BP/Weight  Systolic BP 606 301 601  Diastolic BP 77 75 85  Wt. (Lbs) 293.2    BMI 37.64 kg/m2        Physical Exam Constitutional:      Appearance: He is well-developed.  Cardiovascular:     Rate and Rhythm: Tachycardia present.     Heart sounds: Normal heart sounds. No murmur heard. Pulmonary:     Effort: Pulmonary effort is normal.     Breath sounds: Normal breath sounds. No wheezing or rales.  Chest:     Chest wall: No tenderness.  Abdominal:     General: Bowel sounds are normal. There is no distension.     Palpations: Abdomen is soft. There is no mass.     Tenderness: There is no abdominal tenderness.  Musculoskeletal:        General: Normal range of motion.     Right lower leg: No edema.     Left lower leg: No edema.     Comments: Abduction of right shoulder restricted to 140 degrees Abduction of left shoulder is normal Slight tenderness on palpation of midpoint of R bicep, L Is normal. Normal handgrip bilaterally   Neurological:     Mental Status: He is alert and oriented to person, place, and time.     Deep Tendon Reflexes: Reflexes normal.  Psychiatric:        Mood and Affect: Mood  normal.        Latest Ref Rng & Units 06/12/2021    3:34 PM 11/06/2020    8:41 AM 01/25/2020    3:30 PM  CMP  Glucose 70 -  99 mg/dL 93  96  105   BUN 8 - 27 mg/dL $Remove'12  16  16   'NiqYrtN$ Creatinine 0.76 - 1.27 mg/dL 1.01  0.93  1.12   Sodium 134 - 144 mmol/L 141  140  140   Potassium 3.5 - 5.2 mmol/L 4.4  4.6  3.9   Chloride 96 - 106 mmol/L 104  101  102   CO2 20 - 29 mmol/L $RemoveB'24  25  27   'EncqfxuI$ Calcium 8.6 - 10.2 mg/dL 8.8  8.9  9.0   Total Protein 6.0 - 8.5 g/dL 6.6  6.7    Total Bilirubin 0.0 - 1.2 mg/dL 0.2  0.6    Alkaline Phos 44 - 121 IU/L 77  69    AST 0 - 40 IU/L 21  30    ALT 0 - 44 IU/L 17  38      Lipid Panel     Component Value Date/Time   CHOL 165 11/06/2020 0841   TRIG 72 11/06/2020 0841   HDL 62 11/06/2020 0841   CHOLHDL 2.7 11/06/2020 0841   CHOLHDL 4.4 01/07/2014 0921   VLDL 21 01/07/2014 0921   LDLCALC 89 11/06/2020 0841    CBC    Component Value Date/Time   WBC 8.9 05/27/2016 1316   RBC 5.04 05/27/2016 1316   HGB 14.2 05/27/2016 1316   HCT 42.3 05/27/2016 1316   PLT 223 05/27/2016 1316   MCV 83.9 05/27/2016 1316   MCH 28.2 05/27/2016 1316   MCHC 33.6 05/27/2016 1316   RDW 15.3 05/27/2016 1316    Lab Results  Component Value Date   HGBA1C 6.2 09/18/2021    Assessment & Plan:  1. Controlled type 2 diabetes mellitus with other skin complication, without long-term current use of insulin (HCC) Controlled with A1c of 6.2 which is improved from 6.4 He declined using Ozempic since I have discontinued it Counseled on Diabetic diet, my plate method, 867 minutes of moderate intensity exercise/week Blood sugar logs with fasting goals of 80-120 mg/dl, random of less than 180 and in the event of sugars less than 60 mg/dl or greater than 400 mg/dl encouraged to notify the clinic. Advised on the need for annual eye exams, annual foot exams, Pneumonia vaccine. - POCT glucose (manual entry) - POCT glycosylated hemoglobin (Hb A1C) - atorvastatin (LIPITOR) 20 MG tablet;  TAKE 1 TABLET (20 MG TOTAL) BY MOUTH DAILY.  Dispense: 90 tablet; Refill: 1 - Basic Metabolic Panel  2. Hypertension associated with diabetes ([place]) Controlled Continue current regimen Counseled on blood pressure goal of less than 130/80, low-sodium, DASH diet, medication compliance, 150 minutes of moderate intensity exercise per week. Discussed medication compliance, adverse effects. - amLODipine (NORVASC) 10 MG tablet; TAKE 1 TABLET (10 MG TOTAL) BY MOUTH DAILY.  Dispense: 90 tablet; Refill: 1 - losartan-hydrochlorothiazide (HYZAAR) 100-25 MG tablet; TAKE 1 TABLET BY MOUTH DAILY.  Dispense: 90 tablet; Refill: 1  3. Chronic right shoulder pain Uncontrolled History of trauma 3 months ago Declined referral for PT at last visit and again today He will benefit from cortisone injection - AMB referral to orthopedics - ibuprofen (ADVIL) 600 MG tablet; Take 1 tablet (600 mg total) by mouth 2 (two) times daily as needed.  Dispense: 60 tablet; Refill: 1    No orders of the defined types were placed in this encounter.   Return in about 6 months (around 03/20/2022) for Chronic medical conditions.       Charlott Rakes, MD, FAAFP. [place]  [facility] [place], [place]   09/18/2021, 2:41 PM

## 2021-09-19 LAB — BASIC METABOLIC PANEL
BUN/Creatinine Ratio: 20 (ref 10–24)
BUN: 20 mg/dL (ref 8–27)
CO2: 24 mmol/L (ref 20–29)
Calcium: 9.3 mg/dL (ref 8.6–10.2)
Chloride: 101 mmol/L (ref 96–106)
Creatinine, Ser: 0.99 mg/dL (ref 0.76–1.27)
Glucose: 88 mg/dL (ref 70–99)
Potassium: 4.1 mmol/L (ref 3.5–5.2)
Sodium: 142 mmol/L (ref 134–144)
eGFR: 87 mL/min/{1.73_m2} (ref >59)

## 2021-09-26 ENCOUNTER — Other Ambulatory Visit: Payer: Self-pay

## 2021-09-26 ENCOUNTER — Ambulatory Visit (INDEPENDENT_AMBULATORY_CARE_PROVIDER_SITE_OTHER): Payer: BC Managed Care – PPO

## 2021-09-26 ENCOUNTER — Ambulatory Visit (INDEPENDENT_AMBULATORY_CARE_PROVIDER_SITE_OTHER): Payer: BC Managed Care – PPO | Admitting: Orthopaedic Surgery

## 2021-09-26 DIAGNOSIS — G8929 Other chronic pain: Secondary | ICD-10-CM

## 2021-09-26 DIAGNOSIS — M25511 Pain in right shoulder: Secondary | ICD-10-CM

## 2021-09-26 MED ORDER — METHYLPREDNISOLONE ACETATE 40 MG/ML IJ SUSP
40.0000 mg | INTRAMUSCULAR | Status: AC | PRN
  Administered 2021-09-26: 40 mg via INTRA_ARTICULAR

## 2021-09-26 MED ORDER — BUPIVACAINE HCL 0.25 % IJ SOLN
2.0000 mL | INTRAMUSCULAR | Status: AC | PRN
  Administered 2021-09-26: 2 mL via INTRA_ARTICULAR

## 2021-09-26 MED ORDER — TRAMADOL HCL 50 MG PO TABS
50.0000 mg | ORAL_TABLET | Freq: Two times a day (BID) | ORAL | 2 refills | Status: DC | PRN
  Filled 2021-09-26: qty 14, 7d supply, fill #0

## 2021-09-26 MED ORDER — LIDOCAINE HCL 2 % IJ SOLN
2.0000 mL | INTRAMUSCULAR | Status: AC | PRN
  Administered 2021-09-26: 2 mL

## 2021-09-26 NOTE — Progress Notes (Signed)
Office Visit Note   Patient: Seth Weaver           Date of Birth: 1960-06-16           MRN: [id number] Visit Date: 09/26/2021              Requested by: Charlott Rakes, MD [address] [place],  [place] [address] PCP: Charlott Rakes, MD   Assessment & Plan: Visit Diagnoses:  1. Chronic right shoulder pain     Plan: Impression is right shoulder subacromial bursitis.  Today, discussed various treatment options to include subacromial cortisone injection.  He is agreeable to this.  If his symptoms have not improved over the next 4 to 5 weeks he will let us know we will get an MRI to assess for rotator cuff pathology.  Call with concerns or questions in the meantime.  Follow-Up Instructions: Return if symptoms worsen or fail to improve.   Orders:  Orders Placed This Encounter  Procedures   Large Joint Inj: R subacromial bursa   XR Shoulder Right   Meds ordered this encounter  Medications   traMADol (ULTRAM) 50 MG tablet    Sig: Take 1 tablet (50 mg total) by mouth every 12 (twelve) hours as needed.    Dispense:  30 tablet    Refill:  2      Procedures: Large Joint Inj: R subacromial bursa on 09/26/2021 4:39 PM Indications: pain Details: 22 G needle Medications: 2 mL lidocaine 2 %; 2 mL bupivacaine 0.25 %; 40 mg methylPREDNISolone acetate 40 MG/ML Outcome: tolerated well, no immediate complications Patient was prepped and draped in the usual sterile fashion.       Clinical Data: No additional findings.   Subjective: Chief Complaint  Patient presents with   Right Shoulder - Pain    HPI patient is a pleasant 61 year old gentleman who comes in today following an injury to the right shoulder.  Approximately 3 months ago he fell off a ramp landing on his right shoulder while at work.  He notes this is not being filed under work comp.  The pain he has is primarily to the deltoid which is worse with lifting anything.  He denies any weakness to the right  upper extremity.  He has been taking ibuprofen without relief.  No previous cortisone injections to the right shoulder.  Review of Systems as detailed in HPI.  All others reviewed and are negative.   Objective: Vital Signs: There were no vitals taken for this visit.  Physical Exam well-developed well-nourished gentleman in no acute distress.  Alert and oriented x3.  Ortho Exam right shoulder exam reveals forward flexion to approximately 160 degrees but he does have pain past 90 degrees.  Internal rotation to L5.  External rotation.  Negative empty can.  He does have pain with cross body adduction.  He has 5 out of 5 strength throughout.  He is neurovascular intact distally.  Specialty Comments:  No specialty comments available.  Imaging: XR Shoulder Right  Result Date: 09/26/2021 No acute or structural abnormalities    PMFS History: Patient Active Problem List   Diagnosis Date Noted   S/P inguinal hernia repair, follow-up exam 01/04/2015   Bilateral inguinal hernia without obstruction or gangrene 11/07/2014   Obese 11/07/2014   Inguinal hernia 10/07/2014   Essential hypertension 02/22/2014   DM type 2 (diabetes mellitus, type 2) ([place]) 02/22/2014   Past Medical History:  Diagnosis Date   Diabetes mellitus without complication ([place])  Essential hypertension 02/22/2014   Hyperlipidemia    Hypertension    Inguinal hernia 10/07/2014    Family History  Problem Relation Age of Onset   Diabetes Father    Hypertension Father    Colon cancer Paternal Uncle    Colon polyps Neg Hx    Esophageal cancer Neg Hx    Rectal cancer Neg Hx    Stomach cancer Neg Hx     Past Surgical History:  Procedure Laterality Date   HERNIA REPAIR     Social History   Occupational History   Not on file  Tobacco Use   Smoking status: Never   Smokeless tobacco: Never  Vaping Use   Vaping Use: Never used  Substance and Sexual Activity   Alcohol use: No    Comment: Pt states he used to drink  but quit approximately 2 months ago.    Drug use: No   Sexual activity: Not on file

## 2021-12-24 ENCOUNTER — Other Ambulatory Visit: Payer: Self-pay

## 2022-03-20 ENCOUNTER — Ambulatory Visit: Payer: BC Managed Care – PPO | Attending: Family Medicine | Admitting: Family Medicine

## 2022-03-20 ENCOUNTER — Encounter: Payer: Self-pay | Admitting: Family Medicine

## 2022-03-20 ENCOUNTER — Other Ambulatory Visit: Payer: Self-pay

## 2022-03-20 VITALS — BP 135/82 | HR 104 | Temp 98.2°F | Ht 74.0 in | Wt 310.8 lb

## 2022-03-20 DIAGNOSIS — E11628 Type 2 diabetes mellitus with other skin complications: Secondary | ICD-10-CM | POA: Diagnosis not present

## 2022-03-20 DIAGNOSIS — L84 Corns and callosities: Secondary | ICD-10-CM | POA: Diagnosis not present

## 2022-03-20 DIAGNOSIS — E1159 Type 2 diabetes mellitus with other circulatory complications: Secondary | ICD-10-CM

## 2022-03-20 DIAGNOSIS — I152 Hypertension secondary to endocrine disorders: Secondary | ICD-10-CM

## 2022-03-20 LAB — POCT GLYCOSYLATED HEMOGLOBIN (HGB A1C): HbA1c, POC (controlled diabetic range): 6.4 % (ref 0.0–7.0)

## 2022-03-20 MED ORDER — LOSARTAN POTASSIUM-HCTZ 100-25 MG PO TABS
1.0000 | ORAL_TABLET | Freq: Every day | ORAL | 1 refills | Status: DC
  Filled 2022-03-20: qty 90, 90d supply, fill #0
  Filled 2022-07-09: qty 90, 90d supply, fill #1

## 2022-03-20 MED ORDER — AMLODIPINE BESYLATE 10 MG PO TABS
10.0000 mg | ORAL_TABLET | Freq: Every day | ORAL | 1 refills | Status: DC
  Filled 2022-03-20: qty 90, 90d supply, fill #0
  Filled 2022-07-09: qty 90, 90d supply, fill #1

## 2022-03-20 MED ORDER — ATORVASTATIN CALCIUM 20 MG PO TABS
20.0000 mg | ORAL_TABLET | Freq: Every day | ORAL | 1 refills | Status: DC
  Filled 2022-03-20: qty 90, 90d supply, fill #0
  Filled 2022-07-09: qty 90, 90d supply, fill #1

## 2022-03-20 NOTE — Patient Instructions (Signed)

## 2022-03-20 NOTE — Progress Notes (Signed)
Subjective:  Patient ID: Seth Weaver, male    DOB: 06-27-1960  Age: 61 y.o. MRN: [id number]  CC: Diabetes   HPI Seth Weaver is a 61 y.o. year old male with a history of type 2 diabetes mellitus (diet-controlled with A1c 6.2), hypertension, hyperlipidemia who presents for follow-up visit.    Interval History:  He gained 17 lbs in the last 6 months.  A1c is 6.4 up from 6.2.  He has no hypoglycemia or numbness in extremities.  Diabetes has been diet controlled. He is yet to undergo an eye exam and denies presence of visual concerns. Endorses adherence with his statin and antihypertensive. He has no additional concerns today.  Past Medical History:  Diagnosis Date   Diabetes mellitus without complication ([place])    Essential hypertension 02/22/2014   Hyperlipidemia    Hypertension    Inguinal hernia 10/07/2014    Past Surgical History:  Procedure Laterality Date   HERNIA REPAIR      Family History  Problem Relation Age of Onset   Diabetes Father    Hypertension Father    Colon cancer Paternal Uncle    Colon polyps Neg Hx    Esophageal cancer Neg Hx    Rectal cancer Neg Hx    Stomach cancer Neg Hx     Social History   Socioeconomic History   Marital status: Married    Spouse name: Not on file   Number of children: Not on file   Years of education: Not on file   Highest education level: Not on file  Occupational History   Not on file  Tobacco Use   Smoking status: Never   Smokeless tobacco: Never  Vaping Use   Vaping Use: Never used  Substance and Sexual Activity   Alcohol use: No    Comment: Pt states he used to drink but quit approximately 2 months ago.    Drug use: No   Sexual activity: Not on file  Other Topics Concern   Not on file  Social History Narrative   Not on file   Social Determinants of Health   Financial Resource Strain: Not on file  Food Insecurity: Not on file  Transportation Needs: Not on file  Physical Activity: Not on file   Stress: Not on file  Social Connections: Not on file    No Known Allergies  Outpatient Medications Prior to Visit  Medication Sig Dispense Refill   Blood Glucose Monitoring Suppl (BLOOD GLUCOSE METER KIT AND SUPPLIES) Dispense based on patient and insurance preference. Test blood sugar once daily as directed. (FOR ICD-9 250.00, 250.01). 1 each 0   cyclobenzaprine (FLEXERIL) 10 MG tablet Take 1 tablet (10 mg total) by mouth 2 (two) times daily as needed for muscle spasms. 40 tablet 1   glucose blood test strip Use as instructed 100 each 12   ibuprofen (ADVIL) 600 MG tablet Take 1 tablet (600 mg total) by mouth 2 (two) times daily as needed. 60 tablet 1   terbinafine (LAMISIL AT) 1 % cream apply cream to affected area(s) 2 (two) times daily. 30 g 1   tobramycin (TOBREX) 0.3 % ophthalmic solution Place 1 drop into the right eye every 4 (four) hours. 5 mL 0   traMADol (ULTRAM) 50 MG tablet Take 1 tablet (50 mg total) by mouth every 12 (twelve) hours as needed. 30 tablet 2   amLODipine (NORVASC) 10 MG tablet Take 1 tablet (10 mg total) by mouth daily. 90 tablet 1   atorvastatin (  LIPITOR) 20 MG tablet Take 1 tablet (20 mg total) by mouth daily. 90 tablet 1   losartan-hydrochlorothiazide (HYZAAR) 100-25 MG tablet Take 1 tablet by mouth daily. 90 tablet 1   sildenafil (VIAGRA) 50 MG tablet TAKE 1 TABLET (50 MG TOTAL) BY MOUTH DAILY AS NEEDED FOR ERECTILE DYSFUNCTION. AT LEAST 24 HOURS BETWEEN DOSES (Patient not taking: Reported on 07/24/2021) 10 tablet 1   Facility-Administered Medications Prior to Visit  Medication Dose Route Frequency Provider Last Rate Last Admin   0.9 %  sodium chloride infusion  500 mL Intravenous Once Danis, Estill Cotta III, MD       0.9 %  sodium chloride infusion  500 mL Intravenous Once Nelida Meuse III, MD         ROS Review of Systems  Constitutional:  Negative for activity change and appetite change.  HENT:  Negative for sinus pressure and sore throat.   Respiratory:   Negative for chest tightness, shortness of breath and wheezing.   Cardiovascular:  Negative for chest pain and palpitations.  Gastrointestinal:  Negative for abdominal distention, abdominal pain and constipation.  Genitourinary: Negative.   Musculoskeletal: Negative.   Psychiatric/Behavioral:  Negative for behavioral problems and dysphoric mood.     Objective:  BP 135/82   Pulse (!) 104   Temp 98.2 F (36.8 C) (Oral)   Ht _0  (1.88 m)   Wt (!) 310 lb 12.8 oz (141 kg)   SpO2 96%   BMI 39.90 kg/m      03/20/2022    1:40 PM 09/18/2021    2:30 PM 07/24/2021    9:49 AM  BP/Weight  Systolic BP 341 937 902  Diastolic BP 82 77 75  Wt. (Lbs) 310.8 293.2   BMI 39.9 kg/m2 37.64 kg/m2       Physical Exam Constitutional:      Appearance: He is well-developed.  Cardiovascular:     Rate and Rhythm: Normal rate.     Heart sounds: Normal heart sounds. No murmur heard. Pulmonary:     Effort: Pulmonary effort is normal.     Breath sounds: Normal breath sounds. No wheezing or rales.  Chest:     Chest wall: No tenderness.  Abdominal:     General: Bowel sounds are normal. There is no distension.     Palpations: Abdomen is soft. There is no mass.     Tenderness: There is no abdominal tenderness.  Musculoskeletal:        General: Normal range of motion.     Right lower leg: No edema.     Left lower leg: No edema.  Neurological:     Mental Status: He is alert and oriented to person, place, and time.  Psychiatric:        Mood and Affect: Mood normal.    Diabetic Foot Exam - Simple   Simple Foot Form Diabetic Foot exam was performed with the following findings: Yes 03/20/2022  1:59 PM  Visual Inspection See comments: Yes Sensation Testing Intact to touch and monofilament testing bilaterally: Yes Pulse Check Posterior Tibialis and Dorsalis pulse intact bilaterally: Yes Comments Thick dystrophic great toenails bilaterally Callus on medial aspect of bilateral great toes         Latest Ref Rng & Units 09/18/2021    3:03 PM 06/12/2021    3:34 PM 11/06/2020    8:41 AM  CMP  Glucose 70 - 99 mg/dL 88  93  96   BUN 8 - 27 mg/dL 20  12  16   Creatinine 0.76 - 1.27 mg/dL 0.99  1.01  0.93   Sodium 134 - 144 mmol/L 142  141  140   Potassium 3.5 - 5.2 mmol/L 4.1  4.4  4.6   Chloride 96 - 106 mmol/L 101  104  101   CO2 20 - 29 mmol/L _0 Calcium 8.6 - 10.2 mg/dL 9.3  8.8  8.9   Total Protein 6.0 - 8.5 g/dL  6.6  6.7   Total Bilirubin 0.0 - 1.2 mg/dL  0.2  0.6   Alkaline Phos 44 - 121 IU/L  77  69   AST 0 - 40 IU/L  21  30   ALT 0 - 44 IU/L  17  38     Lipid Panel     Component Value Date/Time   CHOL 165 11/06/2020 0841   TRIG 72 11/06/2020 0841   HDL 62 11/06/2020 0841   CHOLHDL 2.7 11/06/2020 0841   CHOLHDL 4.4 01/07/2014 0921   VLDL 21 01/07/2014 0921   LDLCALC 89 11/06/2020 0841    CBC    Component Value Date/Time   WBC 8.9 05/27/2016 1316   RBC 5.04 05/27/2016 1316   HGB 14.2 05/27/2016 1316   HCT 42.3 05/27/2016 1316   PLT 223 05/27/2016 1316   MCV 83.9 05/27/2016 1316   MCH 28.2 05/27/2016 1316   MCHC 33.6 05/27/2016 1316   RDW 15.3 05/27/2016 1316    Lab Results  Component Value Date   HGBA1C 6.4 03/20/2022    Assessment & Plan:  1. Controlled type 2 diabetes mellitus with other skin complication, without long-term current use of insulin (HCC) Diet controlled with A1c of 6.4 Continue to work on lifestyle especially since he has gained 17 pounds in the last 6 months Declined GLP-1 RA previously Counseled on Diabetic diet, my plate method, 150 minutes of moderate intensity exercise/week Blood sugar logs with fasting goals of 80-120 mg/dl, random of less than 180 and in the event of sugars less than 60 mg/dl or greater than 400 mg/dl encouraged to notify the clinic. Advised on the need for annual eye exams, annual foot exams, Pneumonia vaccine.  - POCT glycosylated hemoglobin (Hb A1C) - Microalbumin/Creatinine Ratio,  Urine - Ambulatory referral to Ophthalmology - atorvastatin (LIPITOR) 20 MG tablet; Take 1 tablet (20 mg total) by mouth daily.  Dispense: 90 tablet; Refill: 1 - CMP14+EGFR - Ambulatory referral to Podiatry  2. Hypertension associated with diabetes ([place]) Controlled Counseled on blood pressure goal of less than 130/80, low-sodium, DASH diet, medication compliance, 150 minutes of moderate intensity exercise per week. Discussed medication compliance, adverse effects. - amLODipine (NORVASC) 10 MG tablet; Take 1 tablet (10 mg total) by mouth daily.  Dispense: 90 tablet; Refill: 1 - losartan-hydrochlorothiazide (HYZAAR) 100-25 MG tablet; Take 1 tablet by mouth daily.  Dispense: 90 tablet; Refill: 1  3. Callus of foot He will need debridement of his toenails and excision of callus - Ambulatory referral to Podiatry   Meds ordered this encounter  Medications   amLODipine (NORVASC) 10 MG tablet    Sig: Take 1 tablet (10 mg total) by mouth daily.    Dispense:  90 tablet    Refill:  1   atorvastatin (LIPITOR) 20 MG tablet    Sig: Take 1 tablet (20 mg total) by mouth daily.    Dispense:  90 tablet    Refill:  1   losartan-hydrochlorothiazide (HYZAAR) 100-25 MG tablet  Sig: Take 1 tablet by mouth daily.    Dispense:  90 tablet    Refill:  1    Follow-up: Return in about 6 months (around 09/19/2022) for Chronic medical conditions.       Charlott Rakes, MD, FAAFP. [facility] and [place], [place]   03/20/2022, 2:04 PM

## 2022-03-21 LAB — CMP14+EGFR
ALT: 30 IU/L (ref 0–44)
AST: 27 IU/L (ref 0–40)
Albumin/Globulin Ratio: 1.4 (ref 1.2–2.2)
Albumin: 4.2 g/dL (ref 3.9–4.9)
Alkaline Phosphatase: 66 IU/L (ref 44–121)
BUN/Creatinine Ratio: 19 (ref 10–24)
BUN: 21 mg/dL (ref 8–27)
Bilirubin Total: 0.4 mg/dL (ref 0.0–1.2)
CO2: 29 mmol/L (ref 20–29)
Calcium: 9.5 mg/dL (ref 8.6–10.2)
Chloride: 102 mmol/L (ref 96–106)
Creatinine, Ser: 1.08 mg/dL (ref 0.76–1.27)
Globulin, Total: 3 g/dL (ref 1.5–4.5)
Glucose: 92 mg/dL (ref 70–99)
Potassium: 4.3 mmol/L (ref 3.5–5.2)
Sodium: 142 mmol/L (ref 134–144)
Total Protein: 7.2 g/dL (ref 6.0–8.5)
eGFR: 78 mL/min/{1.73_m2} (ref >59)

## 2022-03-21 LAB — MICROALBUMIN / CREATININE URINE RATIO
Creatinine, Urine: 179.4 mg/dL
Microalb/Creat Ratio: 2 mg/g creat (ref 0–29)
Microalbumin, Urine: 3.8 ug/mL

## 2022-05-07 ENCOUNTER — Encounter: Payer: Self-pay | Admitting: Family Medicine

## 2022-05-07 ENCOUNTER — Ambulatory Visit: Payer: BC Managed Care – PPO | Attending: Family Medicine | Admitting: Family Medicine

## 2022-05-07 VITALS — BP 133/91 | HR 104 | Temp 98.3°F | Ht 74.0 in | Wt 318.0 lb

## 2022-05-07 DIAGNOSIS — G8929 Other chronic pain: Secondary | ICD-10-CM | POA: Diagnosis not present

## 2022-05-07 DIAGNOSIS — M25561 Pain in right knee: Secondary | ICD-10-CM | POA: Diagnosis not present

## 2022-05-07 NOTE — Progress Notes (Signed)
Pain in right knee.

## 2022-05-07 NOTE — Progress Notes (Signed)
Subjective:  Patient ID: Seth Weaver, male    DOB: 12/14/1960  Age: 62 y.o. MRN: [id number]  CC: Knee Pain   HPI Seth Weaver is a 62 y.o. year old male with a history of type 2 diabetes mellitus (diet-controlled with A1c 6.2), hypertension, hyperlipidemia who presents for follow-up visit.      Interval History:  While he was in the [employer] he sustained a right knee injury in the 80s. From time to time it flares up with difficulty extending the knee and pain when he walks.  Pain is worse with the cold weather and when it rains.  [facility] stating this fact and he will undergo knee work up there. Past Medical History:  Diagnosis Date   Diabetes mellitus without complication ([place])    Essential hypertension 02/22/2014   Hyperlipidemia    Hypertension    Inguinal hernia 10/07/2014    Past Surgical History:  Procedure Laterality Date   HERNIA REPAIR      Family History  Problem Relation Age of Onset   Diabetes Father    Hypertension Father    Colon cancer Paternal Uncle    Colon polyps Neg Hx    Esophageal cancer Neg Hx    Rectal cancer Neg Hx    Stomach cancer Neg Hx     Social History   Socioeconomic History   Marital status: Married    Spouse name: Not on file   Number of children: Not on file   Years of education: Not on file   Highest education level: Not on file  Occupational History   Not on file  Tobacco Use   Smoking status: Never   Smokeless tobacco: Never  Vaping Use   Vaping Use: Never used  Substance and Sexual Activity   Alcohol use: No    Comment: Pt states he used to drink but quit approximately 2 months ago.    Drug use: No   Sexual activity: Not on file  Other Topics Concern   Not on file  Social History Narrative   Not on file   Social Determinants of Health   Financial Resource Strain: Not on file  Food Insecurity: Not on file  Transportation Needs: Not on file  Physical Activity: Not on file   Stress: Not on file  Social Connections: Not on file    No Known Allergies  Outpatient Medications Prior to Visit  Medication Sig Dispense Refill   amLODipine (NORVASC) 10 MG tablet Take 1 tablet (10 mg total) by mouth daily. 90 tablet 1   atorvastatin (LIPITOR) 20 MG tablet Take 1 tablet (20 mg total) by mouth daily. 90 tablet 1   losartan-hydrochlorothiazide (HYZAAR) 100-25 MG tablet Take 1 tablet by mouth daily. 90 tablet 1   Blood Glucose Monitoring Suppl (BLOOD GLUCOSE METER KIT AND SUPPLIES) Dispense based on patient and insurance preference. Test blood sugar once daily as directed. (FOR ICD-9 250.00, 250.01). (Patient not taking: Reported on 05/07/2022) 1 each 0   cyclobenzaprine (FLEXERIL) 10 MG tablet Take 1 tablet (10 mg total) by mouth 2 (two) times daily as needed for muscle spasms. (Patient not taking: Reported on 05/07/2022) 40 tablet 1   glucose blood test strip Use as instructed (Patient not taking: Reported on 05/07/2022) 100 each 12   ibuprofen (ADVIL) 600 MG tablet Take 1 tablet (600 mg total) by mouth 2 (two) times daily as needed. (Patient not taking: Reported on 05/07/2022) 60 tablet  1   sildenafil (VIAGRA) 50 MG tablet TAKE 1 TABLET (50 MG TOTAL) BY MOUTH DAILY AS NEEDED FOR ERECTILE DYSFUNCTION. AT LEAST 24 HOURS BETWEEN DOSES (Patient not taking: Reported on 07/24/2021) 10 tablet 1   terbinafine (LAMISIL AT) 1 % cream apply cream to affected area(s) 2 (two) times daily. (Patient not taking: Reported on 05/07/2022) 30 g 1   tobramycin (TOBREX) 0.3 % ophthalmic solution Place 1 drop into the right eye every 4 (four) hours. (Patient not taking: Reported on 05/07/2022) 5 mL 0   traMADol (ULTRAM) 50 MG tablet Take 1 tablet (50 mg total) by mouth every 12 (twelve) hours as needed. (Patient not taking: Reported on 05/07/2022) 30 tablet 2   Facility-Administered Medications Prior to Visit  Medication Dose Route Frequency Provider Last Rate Last Admin   0.9 %  sodium chloride infusion   500 mL Intravenous Once Danis, Estill Cotta III, MD       0.9 %  sodium chloride infusion  500 mL Intravenous Once Nelida Meuse III, MD         ROS Review of Systems  Constitutional:  Negative for activity change and appetite change.  HENT:  Negative for sinus pressure and sore throat.   Respiratory:  Negative for chest tightness, shortness of breath and wheezing.   Cardiovascular:  Negative for chest pain and palpitations.  Gastrointestinal:  Negative for abdominal distention, abdominal pain and constipation.  Genitourinary: Negative.   Psychiatric/Behavioral:  Negative for behavioral problems and dysphoric mood.     Objective:  BP (!) 133/91   Pulse (!) 104   Temp 98.3 F (36.8 C) (Oral)   Ht 6' 2"$  (1.88 m)   Wt (!) 318 lb (144.2 kg)   SpO2 97%   BMI 40.83 kg/m      05/07/2022    9:20 AM 05/07/2022    8:50 AM 03/20/2022    1:40 PM  BP/Weight  Systolic BP [id number]  Diastolic BP 91 91 82  Wt. (Lbs)  318 310.8  BMI  40.83 kg/m2 39.9 kg/m2      Physical Exam Constitutional:      Appearance: He is well-developed.  Cardiovascular:     Rate and Rhythm: Normal rate.     Heart sounds: Normal heart sounds. No murmur heard. Pulmonary:     Effort: Pulmonary effort is normal.     Breath sounds: Normal breath sounds. No wheezing or rales.  Chest:     Chest wall: No tenderness.  Abdominal:     General: Bowel sounds are normal. There is no distension.     Palpations: Abdomen is soft. There is no mass.     Tenderness: There is no abdominal tenderness.  Musculoskeletal:        General: Normal range of motion.     Right lower leg: No edema.     Left lower leg: No edema.     Comments: Normal appearance of both knees Able to flex both knees, no tenderness elicited  Neurological:     Mental Status: He is alert and oriented to person, place, and time.  Psychiatric:        Mood and Affect: Mood normal.        Latest Ref Rng & Units 03/20/2022    2:22 PM 09/18/2021     3:03 PM 06/12/2021    3:34 PM  CMP  Glucose 70 - 99 mg/dL 92  88  93   BUN 8 - 27 mg/dL 21  20  12   Creatinine 0.76 - 1.27 mg/dL 1.08  0.99  1.01   Sodium 134 - 144 mmol/L 142  142  141   Potassium 3.5 - 5.2 mmol/L 4.3  4.1  4.4   Chloride 96 - 106 mmol/L 102  101  104   CO2 20 - 29 mmol/L 29  24  24   $ Calcium 8.6 - 10.2 mg/dL 9.5  9.3  8.8   Total Protein 6.0 - 8.5 g/dL 7.2   6.6   Total Bilirubin 0.0 - 1.2 mg/dL 0.4   0.2   Alkaline Phos 44 - 121 IU/L 66   77   AST 0 - 40 IU/L 27   21   ALT 0 - 44 IU/L 30   17     Lipid Panel     Component Value Date/Time   CHOL 165 11/06/2020 0841   TRIG 72 11/06/2020 0841   HDL 62 11/06/2020 0841   CHOLHDL 2.7 11/06/2020 0841   CHOLHDL 4.4 01/07/2014 0921   VLDL 21 01/07/2014 0921   LDLCALC 89 11/06/2020 0841    CBC    Component Value Date/Time   WBC 8.9 05/27/2016 1316   RBC 5.04 05/27/2016 1316   HGB 14.2 05/27/2016 1316   HCT 42.3 05/27/2016 1316   PLT 223 05/27/2016 1316   MCV 83.9 05/27/2016 1316   MCH 28.2 05/27/2016 1316   MCHC 33.6 05/27/2016 1316   RDW 15.3 05/27/2016 1316    Lab Results  Component Value Date   HGBA1C 6.4 03/20/2022    Assessment & Plan:  1. Chronic pain of right knee Remote history of trauma while in the [place] several years ago Likely underlying osteoarthritis NSAIDs and weight loss will be beneficial He declines additional workup or treatment at this time as he would like to undergo this under the [facility] system I have provided him with a letter as requested.    No orders of the defined types were placed in this encounter.   Follow-up: Return for previously scheduled appointment.       Charlott Rakes, MD, FAAFP. [facility] and [place], [place]   05/07/2022, 9:30 AM

## 2022-05-07 NOTE — Patient Instructions (Signed)
Chronic Knee Pain, Adult ?Chronic knee pain is pain in one or both knees that lasts longer than 3 months. Symptoms of chronic knee pain may include swelling, stiffness, and discomfort. Age-related wear and tear (osteoarthritis) of the knee joint is the most common cause of chronic knee pain. Other possible causes include: ?A long-term immune-related disease that causes inflammation of the knee (rheumatoid arthritis). This usually affects both knees. ?Inflammatory arthritis, such as gout or pseudogout. ?An injury to the knee that causes arthritis. ?An injury to the knee that damages the ligaments. Ligaments are strong tissues that connect bones to each other. ?Runner's knee or pain behind the kneecap. ?Treatment for chronic knee pain depends on the cause. The main treatments for chronic knee pain are physical therapy and weight loss. This condition may also be treated with medicines, injections, a knee sleeve or brace, and by using crutches. Rest, ice, pressure (compression), and elevation, also known as RICE therapy, may also be recommended. ?Follow these instructions at home: ?If you have a knee sleeve or brace: ? ?Wear the knee sleeve or brace as told by your health care provider. Remove it only as told by your health care provider. ?Loosen it if your toes tingle, become numb, or turn cold and blue. ?Keep it clean. ?If the sleeve or brace is not waterproof: ?Do not let it get wet. ?Remove it if allowed by your health care provider, or cover it with a watertight covering when you take a bath or a shower. ?Managing pain, stiffness, and swelling ? ?  ? ?If directed, apply heat to the affected area as often as told by your health care provider. Use the heat source that your health care provider recommends, such as a moist heat pack or a heating pad. ?If you have a removable knee sleeve or brace, remove it as told by your health care provider. ?Place a towel between your skin and the heat source. ?Leave the heat on for  20-30 minutes. ?Remove the heat if your skin turns bright red. This is especially important if you are unable to feel pain, heat, or cold. You may have a greater risk of getting burned. ?If directed, put ice on the affected area. To do this: ?If you have a removable knee sleeve or brace, remove it as told by your health care provider. ?Put ice in a plastic bag. ?Place a towel between your skin and the bag. ?Leave the ice on for 20 minutes, 2-3 times a day. ?Remove the ice if your skin turns bright red. This is very important. If you cannot feel pain, heat, or cold, you have a greater risk of damage to the area. ?Move your toes often to reduce stiffness and swelling. ?Raise (elevate) the injured area above the level of your heart while you are sitting or lying down. ?Activity ?Avoid high-impact activities or exercises, such as running, jumping rope, or doing jumping jacks. ?Follow the exercise plan that your health care provider designed for you. Your health care provider may suggest that you: ?Avoid activities that make knee pain worse. This may require you to change your exercise routines, sport participation, or job duties. ?Wear shoes with cushioned soles. ?Avoid sports that require running and sudden changes in direction. ?Do physical therapy. Physical therapy is planned to match your needs and abilities. It may include exercises for strength, flexibility, stability, and endurance. ?Do exercises that increase balance and strength, such as tai chi and yoga. ?Do not use the injured limb to support your   body weight until your health care provider says that you can. Use crutches as told by your health care provider. ?Return to your normal activities as told by your health care provider. Ask your health care provider what activities are safe for you. ?General instructions ?Take over-the-counter and prescription medicines only as told by your health care provider. ?Lose weight if you are overweight. Losing even a  little weight can reduce knee pain. Ask your health care provider what your ideal weight is, and how to safely lose extra weight. A dietitian may be able to help you plan your meals. ?Do not use any products that contain nicotine or tobacco, such as cigarettes, e-cigarettes, and chewing tobacco. These can delay healing. If you need help quitting, ask your health care provider. ?Keep all follow-up visits. This is important. ?Contact a health care provider if: ?You have knee pain that is not getting better or gets worse. ?You are unable to do your physical therapy exercises due to knee pain. ?Get help right away if: ?Your knee swells and the swelling becomes worse. ?You cannot move your knee. ?You have severe knee pain. ?Summary ?Knee pain that lasts more than 3 months is considered chronic knee pain. ?The main treatments for chronic knee pain are physical therapy and weight loss. You may also need to take medicines, wear a knee sleeve or brace, use crutches, and apply ice or heat. ?Losing even a little weight can reduce knee pain. Ask your health care provider what your ideal weight is, and how to safely lose extra weight. A dietitian may be able to help you plan your meals. ?Follow the exercise plan that your health care provider designed for you. ?This information is not intended to replace advice given to you by your health care provider. Make sure you discuss any questions you have with your health care provider. ?Document Revised: 08/25/2019 Document Reviewed: 08/25/2019 ?Elsevier Patient Education ? 2023 Elsevier Inc. ? ?

## 2022-07-01 DIAGNOSIS — H9313 Tinnitus, bilateral: Secondary | ICD-10-CM | POA: Diagnosis not present

## 2022-07-01 DIAGNOSIS — H903 Sensorineural hearing loss, bilateral: Secondary | ICD-10-CM | POA: Diagnosis not present

## 2022-07-09 ENCOUNTER — Other Ambulatory Visit: Payer: Self-pay

## 2022-07-10 ENCOUNTER — Other Ambulatory Visit: Payer: Self-pay

## 2022-07-17 ENCOUNTER — Telehealth: Payer: Self-pay

## 2022-07-17 NOTE — Progress Notes (Signed)
Patient attempted to be outreached by Jacob Goodman, PharmD Candidate on 07/17/2022 to discuss hypertension. Left voicemail for patient to return our call at their convenience at [phone].   Jacob Goodman, PharmD Candidate   Calbert Hulsebus, Pharm.D. PGY-2 Ambulatory Care Pharmacy Resident 

## 2022-08-14 DIAGNOSIS — Z0189 Encounter for other specified special examinations: Secondary | ICD-10-CM | POA: Diagnosis not present

## 2022-08-14 DIAGNOSIS — E7439 Other disorders of intestinal carbohydrate absorption: Secondary | ICD-10-CM | POA: Diagnosis not present

## 2022-08-14 DIAGNOSIS — I10 Essential (primary) hypertension: Secondary | ICD-10-CM | POA: Diagnosis not present

## 2022-08-14 DIAGNOSIS — F4310 Post-traumatic stress disorder, unspecified: Secondary | ICD-10-CM | POA: Diagnosis not present

## 2022-08-14 DIAGNOSIS — F32A Depression, unspecified: Secondary | ICD-10-CM | POA: Diagnosis not present

## 2022-08-21 DIAGNOSIS — F32A Depression, unspecified: Secondary | ICD-10-CM | POA: Diagnosis not present

## 2022-09-19 ENCOUNTER — Ambulatory Visit: Payer: BC Managed Care – PPO | Admitting: Family Medicine

## 2022-10-01 ENCOUNTER — Encounter: Payer: Self-pay | Admitting: Family Medicine

## 2022-10-01 ENCOUNTER — Ambulatory Visit: Payer: BC Managed Care – PPO | Attending: Family Medicine | Admitting: Family Medicine

## 2022-10-01 ENCOUNTER — Other Ambulatory Visit: Payer: Self-pay

## 2022-10-01 VITALS — BP 128/82 | HR 106 | Temp 98.3°F | Ht 74.0 in | Wt 308.6 lb

## 2022-10-01 DIAGNOSIS — E1159 Type 2 diabetes mellitus with other circulatory complications: Secondary | ICD-10-CM | POA: Diagnosis not present

## 2022-10-01 DIAGNOSIS — E1149 Type 2 diabetes mellitus with other diabetic neurological complication: Secondary | ICD-10-CM

## 2022-10-01 DIAGNOSIS — R454 Irritability and anger: Secondary | ICD-10-CM

## 2022-10-01 DIAGNOSIS — E11628 Type 2 diabetes mellitus with other skin complications: Secondary | ICD-10-CM | POA: Diagnosis not present

## 2022-10-01 DIAGNOSIS — I152 Hypertension secondary to endocrine disorders: Secondary | ICD-10-CM

## 2022-10-01 DIAGNOSIS — M25561 Pain in right knee: Secondary | ICD-10-CM

## 2022-10-01 DIAGNOSIS — Z125 Encounter for screening for malignant neoplasm of prostate: Secondary | ICD-10-CM

## 2022-10-01 DIAGNOSIS — G8929 Other chronic pain: Secondary | ICD-10-CM

## 2022-10-01 LAB — POCT GLYCOSYLATED HEMOGLOBIN (HGB A1C): HbA1c, POC (controlled diabetic range): 6.4 % (ref 0.0–7.0)

## 2022-10-01 MED ORDER — LOSARTAN POTASSIUM-HCTZ 100-25 MG PO TABS
1.0000 | ORAL_TABLET | Freq: Every day | ORAL | 1 refills | Status: DC
Start: 2022-10-01 — End: 2023-04-03
  Filled 2022-10-01 – 2022-10-24 (×2): qty 90, 90d supply, fill #0

## 2022-10-01 MED ORDER — ATORVASTATIN CALCIUM 20 MG PO TABS
20.0000 mg | ORAL_TABLET | Freq: Every day | ORAL | 1 refills | Status: DC
Start: 2022-10-01 — End: 2023-04-03
  Filled 2022-10-01 – 2022-10-24 (×2): qty 90, 90d supply, fill #0

## 2022-10-01 MED ORDER — AMLODIPINE BESYLATE 10 MG PO TABS
10.0000 mg | ORAL_TABLET | Freq: Every day | ORAL | 1 refills | Status: DC
Start: 2022-10-01 — End: 2023-04-03
  Filled 2022-10-01 – 2022-10-24 (×2): qty 90, 90d supply, fill #0

## 2022-10-01 NOTE — Progress Notes (Signed)
Subjective:  Patient ID: Seth Weaver, male    DOB: November 06, 1960  Age: 62 y.o. MRN: [id number]  CC: Diabetes   HPI Seth Weaver is a 62 y.o. year old male with a history of type 2 diabetes mellitus (diet-controlled with A1c 6.2), hypertension, hyperlipidemia who presents for follow-up visit.    Interval History: Discussed the use of AI scribe software for clinical note transcription with the patient, who gave verbal consent to proceed.  He presents with ongoing knee pain and mental health concerns. [facility], but no surgical intervention is planned at this time. The pain is intermittent, occurring approximately twice a month, and is managed with ibuprofen.  In addition to his physical health, the patient has been seeing a psychiatrist at the [facility] for mental health concerns, including anger and other unspecified issues. 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 He is currently receiving counseling, which he reports is helpful.  The patient's diabetes and hypertension are well-controlled, with a recent A1c of 6.4. He manages his diabetes with diet and takes amlodipine and losartan hydrochlorothiazide for his blood pressure, as well as Lipitor for his cholesterol. He reports occasional tingling in his extremities, but it is not bothersome enough to warrant medication at this time.        Past Medical History:  Diagnosis Date   Diabetes mellitus without complication (HCC)    Essential hypertension 02/22/2014   Hyperlipidemia    Hypertension    Inguinal hernia 10/07/2014    Past Surgical History:  Procedure Laterality Date   HERNIA REPAIR      Family History  Problem Relation Age of Onset   Diabetes Father    Hypertension Father    Colon cancer Paternal Uncle    Colon polyps Neg Hx    Esophageal cancer Neg Hx    Rectal cancer Neg Hx    Stomach cancer Neg Hx     Social History    Socioeconomic History   Marital status: Married    Spouse name: Not on file   Number of children: Not on file   Years of education: Not on file   Highest education level: Not on file  Occupational History   Not on file  Tobacco Use   Smoking status: Never   Smokeless tobacco: Never  Vaping Use   Vaping Use: Never used  Substance and Sexual Activity   Alcohol use: No    Comment: Pt states he used to drink but quit approximately 2 months ago.    Drug use: No   Sexual activity: Not on file  Other Topics Concern   Not on file  Social History Narrative   Not on file   Social Determinants of Health   Financial Resource Strain: Not on file  Food Insecurity: Not on file  Transportation Needs: Not on file  Physical Activity: Not on file  Stress: Not on file  Social Connections: Not on file    No Known Allergies  Outpatient Medications Prior to Visit  Medication Sig Dispense Refill   amLODipine (NORVASC) 10 MG tablet Take 1 tablet (10 mg total) by mouth daily. 90 tablet 1   atorvastatin (LIPITOR) 20 MG tablet Take 1 tablet (20 mg total) by mouth daily. 90 tablet 1   losartan-hydrochlorothiazide (HYZAAR) 100-25 MG tablet Take 1 tablet by mouth daily. 90 tablet 1   Blood Glucose Monitoring Suppl (BLOOD GLUCOSE METER  KIT AND SUPPLIES) Dispense based on patient and insurance preference. Test blood sugar once daily as directed. (FOR ICD-9 250.00, 250.01). (Patient not taking: Reported on 05/07/2022) 1 each 0   cyclobenzaprine (FLEXERIL) 10 MG tablet Take 1 tablet (10 mg total) by mouth 2 (two) times daily as needed for muscle spasms. (Patient not taking: Reported on 05/07/2022) 40 tablet 1   glucose blood test strip Use as instructed (Patient not taking: Reported on 05/07/2022) 100 each 12   ibuprofen (ADVIL) 600 MG tablet Take 1 tablet (600 mg total) by mouth 2 (two) times daily as needed. (Patient not taking: Reported on 05/07/2022) 60 tablet 1   sildenafil (VIAGRA) 50 MG tablet TAKE 1  TABLET (50 MG TOTAL) BY MOUTH DAILY AS NEEDED FOR ERECTILE DYSFUNCTION. AT LEAST 24 HOURS BETWEEN DOSES (Patient not taking: Reported on 07/24/2021) 10 tablet 1   terbinafine (LAMISIL AT) 1 % cream apply cream to affected area(s) 2 (two) times daily. (Patient not taking: Reported on 05/07/2022) 30 g 1   tobramycin (TOBREX) 0.3 % ophthalmic solution Place 1 drop into the right eye every 4 (four) hours. (Patient not taking: Reported on 05/07/2022) 5 mL 0   traMADol (ULTRAM) 50 MG tablet Take 1 tablet (50 mg total) by mouth every 12 (twelve) hours as needed. (Patient not taking: Reported on 05/07/2022) 30 tablet 2   Facility-Administered Medications Prior to Visit  Medication Dose Route Frequency Provider Last Rate Last Admin   0.9 %  sodium chloride infusion  500 mL Intravenous Once Danis, Starr Lake III, MD       0.9 %  sodium chloride infusion  500 mL Intravenous Once Charlie Pitter III, MD         ROS Review of Systems  Constitutional:  Negative for activity change and appetite change.  HENT:  Negative for sinus pressure and sore throat.   Respiratory:  Negative for chest tightness, shortness of breath and wheezing.   Cardiovascular:  Negative for chest pain and palpitations.  Gastrointestinal:  Negative for abdominal distention, abdominal pain and constipation.  Genitourinary: Negative.   Musculoskeletal: Negative.   Psychiatric/Behavioral:  Negative for behavioral problems and dysphoric mood.     Objective:  BP 128/82   Pulse (!) 106   Temp 98.3 F (36.8 C) (Oral)   Ht 6\' 2"  (1.88 m)   Wt (!) 308 lb 9.6 oz (140 kg)   SpO2 98%   BMI 39.62 kg/m      10/01/2022    9:42 AM 05/07/2022    9:20 AM 05/07/2022    8:50 AM  BP/Weight  Systolic BP 128 133 137  Diastolic BP 82 91 91  Wt. (Lbs) 308.6  318  BMI 39.62 kg/m2  40.83 kg/m2      Physical Exam Constitutional:      Appearance: He is well-developed.  Cardiovascular:     Rate and Rhythm: Tachycardia present.     Heart sounds:  Normal heart sounds. No murmur heard. Pulmonary:     Effort: Pulmonary effort is normal.     Breath sounds: Normal breath sounds. No wheezing or rales.  Chest:     Chest wall: No tenderness.  Abdominal:     General: Bowel sounds are normal. There is no distension.     Palpations: Abdomen is soft. There is no mass.     Tenderness: There is no abdominal tenderness.  Musculoskeletal:        General: Normal range of motion.     Right  lower leg: No edema.     Left lower leg: No edema.     Comments: Crepitus on range of motion of both knees  Neurological:     Mental Status: He is alert and oriented to person, place, and time.  Psychiatric:        Mood and Affect: Mood normal.        Latest Ref Rng & Units 03/20/2022    2:22 PM 09/18/2021    3:03 PM 06/12/2021    3:34 PM  CMP  Glucose 70 - 99 mg/dL 92  88  93   BUN 8 - 27 mg/dL 21  20  12    Creatinine 0.76 - 1.27 mg/dL 4.09  8.11  9.14   Sodium 134 - 144 mmol/L 142  142  141   Potassium 3.5 - 5.2 mmol/L 4.3  4.1  4.4   Chloride 96 - 106 mmol/L 102  101  104   CO2 20 - 29 mmol/L 29  24  24    Calcium 8.6 - 10.2 mg/dL 9.5  9.3  8.8   Total Protein 6.0 - 8.5 g/dL 7.2   6.6   Total Bilirubin 0.0 - 1.2 mg/dL 0.4   0.2   Alkaline Phos 44 - 121 IU/L 66   77   AST 0 - 40 IU/L 27   21   ALT 0 - 44 IU/L 30   17     Lipid Panel     Component Value Date/Time   CHOL 165 11/06/2020 0841   TRIG 72 11/06/2020 0841   HDL 62 11/06/2020 0841   CHOLHDL 2.7 11/06/2020 0841   CHOLHDL 4.4 01/07/2014 0921   VLDL 21 01/07/2014 0921   LDLCALC 89 11/06/2020 0841    CBC    Component Value Date/Time   WBC 8.9 05/27/2016 1316   RBC 5.04 05/27/2016 1316   HGB 14.2 05/27/2016 1316   HCT 42.3 05/27/2016 1316   PLT 223 05/27/2016 1316   MCV 83.9 05/27/2016 1316   MCH 28.2 05/27/2016 1316   MCHC 33.6 05/27/2016 1316   RDW 15.3 05/27/2016 1316    Lab Results  Component Value Date   HGBA1C 6.4 10/01/2022    Assessment & Plan:       Anger/ Mental Health:  Patient is currently receiving counseling at the [facility] for unspecified mental health concerns, possibly related to [employer]. Counseling is reported to be beneficial. -Continue with counseling at the [place]. -Open discussion about the potential use of medication for mental health concerns if needed in the future.  Knee Pain:  Intermittent knee pain with popping and cracking, no current acute pain. [facility] for [facility]. -Continue ibuprofen as needed for pain. -Encourage patient to follow up with [facility] doctor for further evaluation.  Hypertension:  Well controlled on current regimen of amlodipine and losartan hydrochlorothiazide. -Continue amlodipine and losartan hydrochlorothiazide, refill prescriptions.  Hyperlipidemia:  Patient is currently on Lipitor 20mg . Last cholesterol check was in 2022. -Order lipid panel to assess current cholesterol levels. -Continue Lipitor 20mg , refill prescription.  Diabetes Mellitus:  Well controlled with diet, recent A1c 6.4. -Continue dietary management. -Encourage annual eye exam. -Counseled on Diabetic diet, my plate method, 782 minutes of moderate intensity exercise/week Blood sugar logs with fasting goals of 80-120 mg/dl, random of less than 956 and in the event of sugars less than 60 mg/dl or greater than 213 mg/dl encouraged to notify the clinic. Advised on the need for annual  eye exams, annual foot exams, Pneumonia vaccine.   Diabetic Peripheral Neuropathy:  Patient reports occasional tingling, not currently bothersome. -Monitor symptoms, discuss potential treatment options if symptoms become bothersome.  Follow-up in six months, or sooner if any issues arise.          Health Care Maintenance: screen for prostate cancer Meds ordered this encounter  Medications   amLODipine (NORVASC) 10 MG tablet    Sig: Take 1 tablet (10 mg total) by mouth daily.    Dispense:  90 tablet     Refill:  1   atorvastatin (LIPITOR) 20 MG tablet    Sig: Take 1 tablet (20 mg total) by mouth daily.    Dispense:  90 tablet    Refill:  1   losartan-hydrochlorothiazide (HYZAAR) 100-25 MG tablet    Sig: Take 1 tablet by mouth daily.    Dispense:  90 tablet    Refill:  1    Follow-up: Return in about 6 months (around 04/03/2023).       Seth Register, MD, FAAFP. [facility] [place], [place] [phone]   10/01/2022, 10:06 AM

## 2022-10-01 NOTE — Patient Instructions (Addendum)
Managing Anger, Adult Everyone feels angry from time to time. It is okay and normal to feel angry. However, the way that you behave or react to anger can make it a problem. How do problems managing my anger affect me? Reacting too strongly to anger, acting out aggressively, or not expressing it at all can: Create fear in others. Cause others to react with anger as well. Cause others to shut down. Create legal problems. Stress Anger can trigger stress-related problems, such as headaches, poor digestion, or trouble sleeping. Stress can make anger harder to manage. When anger is out of control, it is likely to create more stress. Relationships Anger can cause emotional relationship problems at home. Aggressive anger can lead to harming someone else. Anger at work is often unacceptable and may lead to job problems. Unpredictable anger can cause problems with friends. Your health Anger can affect your health. Anger has an impact on: Your mental health. Blood pressure. Heart rate. Hormone production. Anger has been associated with: Migraine headaches. Cardiovascular disease. High blood pressure. Chest pain. What actions can I take to manage anger?     Anger management is not just a matter of staying silent. It involves self-awareness of when you are angry and then developing and practicing skills to express it clearly and appropriately. Express anger in a healthy way Use the following healthy strategies to help you manage your anger: Step away. When you are feeling reactive, it may take at least 20 minutes for your body to return to its normal blood pressure and heart rate. To help your body do this, take a walk, listen to music, stretch, take deep breaths, and avoid the person or situation that has you feeling angry. Try to discuss your anger when you feel calm again. Consider how others may feel before you react. Avoid swearing, sighing, raising your voice, or blaming. Realize that  you can have the feeling of anger, but you do not have to become the feeling. All feelings happen and usually lessen over time. Choose a good time to work through problems and reach agreement with the other person. You may be more likely to lose your temper at the end of the day when you are tired. The other person may also still be reactive for a while. Set a time and place to come back together that works for both of you. Keep a journal of your feelings. Write down situations in which you become angry. This helps you know your feelings and may help you figure out what triggers your anger. Change the way you think about the situation Change your perception. Consider if there is another way you can view the situation that will leave you with a different emotion. Sometimes, changing the way you think about a situation can make it seem less infuriating. Here are some ways to do that: Remind yourself that everyone is not out to get you. Remind yourself that a disappointing result is not the end of the world. You can cope with being disappointed. Take steps to solve or prevent the situation that upsets you. Find the humor in an aggravating situation. Deal with the physical effects of anger by taking deep breaths, exercising, or taking a walk. Practice letting go of whatever is making you angry. Realize that many things that may anger you are outside of your control and you cannot do anything about them. Picture a relaxing image in your mind. Close your eyes and use that image to help you calm yourself.   How to recognize anger-related problems Anger becomes a problem if it occurs frequently and lasts for long periods of time. You may also need help managing your anger if: You use physical force or aggression when you are angry and others around you feel threatened and fearful. You feel that your anger is out of control. Anger is interfering with your job. Anger is causing problems with your health. Anger  is causing problems with your relationships. Anger is affecting your ability to tolerate normal daily situations, such as sitting in a traffic jam or waiting in line. You do not trust people around you. Follow these instructions at home: Stop and think before you react. Count to 10 before you speak. Let your loved ones know you are struggling with this and that you are going to seek help. Talk to people you trust and ask them for help. They can also help you find someone who can coach you to react differently. Where to find support To find support on how to manage your anger: Call your health care provider for a referral to a mental health professional. Follow up by calling and making an appointment. Look online to find a psychologist who specializes in anger management. Look online for a local office of the Domestic Abuse Project. Your local hospital or behavioral counselors in your area may also offer anger management programs or support groups that can help. Where to find more information American Psychological Association: apa.org Help Guide: helpguide.org Substance Abuse and Mental Health Services Administration: samhsa.gov Contact a health care provider if: You are unable to control your anger. You feel depressed. Your anger interferes with your ability to function at home, at work, at school, or with friends. You have physical symptoms of anger or stress such as headaches, digestion problems, or trouble sleeping. Get help right away if: You may be a danger to yourself or others. Get help right away if you feel like you may hurt yourself or others, or have thoughts about taking your own life. Go to your nearest emergency room or: Call 911. Call the National Suicide Prevention Lifeline at 1-800-273-8255 or 988. This is open 24 hours a day. Text the Crisis Text Line at 741741. This information is not intended to replace advice given to you by your health care provider. Make sure you  discuss any questions you have with your health care provider. Document Revised: 12/17/2021 Document Reviewed: 07/23/2019 Elsevier Patient Education  2024 Elsevier Inc.  

## 2022-10-02 LAB — LP+NON-HDL CHOLESTEROL
Cholesterol, Total: 151 mg/dL (ref 100–199)
HDL: 56 mg/dL (ref >39)
LDL Chol Calc (NIH): 77 mg/dL (ref 0–99)
Total Non-HDL-Chol (LDL+VLDL): 95 mg/dL (ref 0–129)
Triglycerides: 98 mg/dL (ref 0–149)
VLDL Cholesterol Cal: 18 mg/dL (ref 5–40)

## 2022-10-02 LAB — CMP14+EGFR
ALT: 25 IU/L (ref 0–44)
AST: 27 IU/L (ref 0–40)
Albumin: 3.9 g/dL (ref 3.9–4.9)
Alkaline Phosphatase: 74 IU/L (ref 44–121)
BUN/Creatinine Ratio: 15 (ref 10–24)
BUN: 16 mg/dL (ref 8–27)
Bilirubin Total: 0.5 mg/dL (ref 0.0–1.2)
CO2: 26 mmol/L (ref 20–29)
Calcium: 9.1 mg/dL (ref 8.6–10.2)
Chloride: 100 mmol/L (ref 96–106)
Creatinine, Ser: 1.05 mg/dL (ref 0.76–1.27)
Globulin, Total: 3 g/dL (ref 1.5–4.5)
Glucose: 105 mg/dL — ABNORMAL HIGH (ref 70–99)
Potassium: 4.1 mmol/L (ref 3.5–5.2)
Sodium: 140 mmol/L (ref 134–144)
Total Protein: 6.9 g/dL (ref 6.0–8.5)
eGFR: 81 mL/min/{1.73_m2} (ref >59)

## 2022-10-02 LAB — CBC WITH DIFFERENTIAL/PLATELET
Basophils Absolute: 0 10*3/uL (ref 0.0–0.2)
Basos: 0 %
EOS (ABSOLUTE): 0.1 10*3/uL (ref 0.0–0.4)
Eos: 1 %
Hematocrit: 41.6 % (ref 37.5–51.0)
Hemoglobin: 13.4 g/dL (ref 13.0–17.7)
Immature Grans (Abs): 0 10*3/uL (ref 0.0–0.1)
Immature Granulocytes: 0 %
Lymphocytes Absolute: 1.6 10*3/uL (ref 0.7–3.1)
Lymphs: 22 %
MCH: 27.9 pg (ref 26.6–33.0)
MCHC: 32.2 g/dL (ref 31.5–35.7)
MCV: 87 fL (ref 79–97)
Monocytes Absolute: 0.9 10*3/uL (ref 0.1–0.9)
Monocytes: 12 %
Neutrophils Absolute: 4.7 10*3/uL (ref 1.4–7.0)
Neutrophils: 65 %
Platelets: 227 10*3/uL (ref 150–450)
RBC: 4.81 x10E6/uL (ref 4.14–5.80)
RDW: 13.9 % (ref 11.6–15.4)
WBC: 7.2 10*3/uL (ref 3.4–10.8)

## 2022-10-02 LAB — PSA, TOTAL AND FREE
PSA, Free Pct: 23 %
PSA, Free: 0.23 ng/mL
Prostate Specific Ag, Serum: 1 ng/mL (ref 0.0–4.0)

## 2022-10-07 ENCOUNTER — Other Ambulatory Visit: Payer: Self-pay

## 2022-10-24 ENCOUNTER — Other Ambulatory Visit: Payer: Self-pay

## 2022-10-25 ENCOUNTER — Other Ambulatory Visit: Payer: Self-pay

## 2022-11-04 DIAGNOSIS — F32A Depression, unspecified: Secondary | ICD-10-CM | POA: Diagnosis not present

## 2022-12-04 DIAGNOSIS — F32A Depression, unspecified: Secondary | ICD-10-CM | POA: Diagnosis not present

## 2023-01-07 DIAGNOSIS — E119 Type 2 diabetes mellitus without complications: Secondary | ICD-10-CM | POA: Diagnosis not present

## 2023-01-07 DIAGNOSIS — E785 Hyperlipidemia, unspecified: Secondary | ICD-10-CM | POA: Diagnosis not present

## 2023-01-07 DIAGNOSIS — I10 Essential (primary) hypertension: Secondary | ICD-10-CM | POA: Diagnosis not present

## 2023-04-03 ENCOUNTER — Ambulatory Visit: Payer: BC Managed Care – PPO | Attending: Family Medicine | Admitting: Family Medicine

## 2023-04-03 ENCOUNTER — Other Ambulatory Visit: Payer: Self-pay

## 2023-04-03 ENCOUNTER — Encounter: Payer: Self-pay | Admitting: Family Medicine

## 2023-04-03 VITALS — BP 127/79 | HR 88 | Ht 74.0 in | Wt 310.6 lb

## 2023-04-03 DIAGNOSIS — E11628 Type 2 diabetes mellitus with other skin complications: Secondary | ICD-10-CM

## 2023-04-03 DIAGNOSIS — E1159 Type 2 diabetes mellitus with other circulatory complications: Secondary | ICD-10-CM

## 2023-04-03 DIAGNOSIS — Z634 Disappearance and death of family member: Secondary | ICD-10-CM

## 2023-04-03 DIAGNOSIS — F4320 Adjustment disorder, unspecified: Secondary | ICD-10-CM

## 2023-04-03 DIAGNOSIS — I152 Hypertension secondary to endocrine disorders: Secondary | ICD-10-CM

## 2023-04-03 DIAGNOSIS — Z139 Encounter for screening, unspecified: Secondary | ICD-10-CM

## 2023-04-03 DIAGNOSIS — F1010 Alcohol abuse, uncomplicated: Secondary | ICD-10-CM

## 2023-04-03 DIAGNOSIS — L84 Corns and callosities: Secondary | ICD-10-CM

## 2023-04-03 DIAGNOSIS — Z5986 Financial insecurity: Secondary | ICD-10-CM

## 2023-04-03 LAB — POCT GLYCOSYLATED HEMOGLOBIN (HGB A1C): HbA1c, POC (controlled diabetic range): 6.4 % (ref 0.0–7.0)

## 2023-04-03 MED ORDER — LOSARTAN POTASSIUM-HCTZ 100-25 MG PO TABS
1.0000 | ORAL_TABLET | Freq: Every day | ORAL | 1 refills | Status: DC
  Filled 2023-04-03 – 2023-05-14 (×2): qty 90, 90d supply, fill #0
  Filled 2023-08-12: qty 90, 90d supply, fill #1

## 2023-04-03 MED ORDER — AMLODIPINE BESYLATE 10 MG PO TABS
10.0000 mg | ORAL_TABLET | Freq: Every day | ORAL | 1 refills | Status: DC
  Filled 2023-04-03 – 2023-05-14 (×2): qty 90, 90d supply, fill #0
  Filled 2023-08-12: qty 90, 90d supply, fill #1

## 2023-04-03 MED ORDER — ATORVASTATIN CALCIUM 20 MG PO TABS
20.0000 mg | ORAL_TABLET | Freq: Every day | ORAL | 1 refills | Status: DC
  Filled 2023-04-03 – 2023-05-14 (×2): qty 90, 90d supply, fill #0
  Filled 2023-08-12: qty 90, 90d supply, fill #1

## 2023-04-03 NOTE — Progress Notes (Signed)
 Subjective:  Patient ID: Seth Weaver, male    DOB: [birth year]  Age: 63 y.o. MRN: [id number]  CC: Medical Management of Chronic Issues   HPI Seth Weaver is a 64 y.o. year old male with a history of type 2 diabetes mellitus (diet-controlled with A1c 6.4), hypertension, hyperlipidemia who presents for follow-up visit.      Interval History: Discussed the use of AI scribe software for clinical note transcription with the patient, who gave verbal consent to proceed.  He presents with increased alcohol consumption. He attributes this to the recent loss of his brother and the declining health of his elderly father. He has been seeing a therapist, but has not made an appointment in a while. He has been trying to reduce his alcohol consumption and has been successful in abstaining for the past four days.  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 He has not been engaging in any recreational activities, but does watch sports on TV. He has friends he can talk to and is considering reaching out to them more often to help cope with his stress.  The patient also mentions that he has not seen a podiatrist for his feet, which have developed calluses and problematic toenails. He has not had an eye exam recently either.  His A1c is 6.4 and it is diet controlled. Endorses adherence with his antihypertensive and his statin.       Past Medical History:  Diagnosis Date   Diabetes mellitus without complication (HCC)    Essential hypertension 02/22/2014   Hyperlipidemia    Hypertension    Inguinal hernia 10/07/2014    Past Surgical History:  Procedure Laterality Date   HERNIA REPAIR      Family History  Problem Relation Age of Onset   Diabetes Father    Hypertension Father    Colon cancer Paternal Uncle    Colon polyps Neg Hx    Esophageal cancer Neg Hx    Rectal cancer Neg Hx    Stomach cancer Neg Hx     Social History    Socioeconomic History   Marital status: Married    Spouse name: Not on file   Number of children: Not on file   Years of education: Not on file   Highest education level: Not on file  Occupational History   Not on file  Tobacco Use   Smoking status: Never   Smokeless tobacco: Never  Vaping Use   Vaping status: Never Used  Substance and Sexual Activity   Alcohol use: No    Comment: Pt states he used to drink but quit approximately 2 months ago.    Drug use: No   Sexual activity: Not on file  Other Topics Concern   Not on file  Social History Narrative   Not on file   Social Drivers of Health   Financial Resource Strain: Medium Risk (04/03/2023)   Overall Financial Resource Strain (CARDIA)    Difficulty of Paying Living Expenses: Somewhat hard  Food Insecurity: Food Insecurity Present (04/03/2023)   Hunger Vital Sign    Worried About Running Out of Food in the Last Year: Sometimes true    Ran Out of Food in the Last Year: Sometimes true  Transportation Needs: No Transportation Needs (04/03/2023)   PRAPARE - Administrator, Civil Service (Medical): No    Lack of Transportation (Non-Medical): No  Physical Activity: Patient Declined (04/03/2023)  Exercise Vital Sign    Days of Exercise per Week: Patient declined    Minutes of Exercise per Session: Patient declined  Stress: No Stress Concern Present (04/03/2023)   [employer] of Occupational Health - Occupational Stress Questionnaire    Feeling of Stress : Not at all  Social Connections: Socially Integrated (04/03/2023)   Social Connection and Isolation Panel [NHANES]    Frequency of Communication with Friends and Family: Once a week    Frequency of Social Gatherings with Friends and Family: Twice a week    Attends Religious Services: More than 4 times per year    Active Member of [employer] or Organizations: Yes    Attends Engineer, Structural: More than 4 times per year    Marital Status: Married    No  Known Allergies  Outpatient Medications Prior to Visit  Medication Sig Dispense Refill   Blood Glucose Monitoring Suppl (BLOOD GLUCOSE METER KIT AND SUPPLIES) Dispense based on patient and insurance preference. Test blood sugar once daily as directed. (FOR ICD-9 250.00, 250.01). 1 each 0   amLODipine  (NORVASC ) 10 MG tablet Take 1 tablet (10 mg total) by mouth daily. 90 tablet 1   atorvastatin  (LIPITOR) 20 MG tablet Take 1 tablet (20 mg total) by mouth daily. 90 tablet 1   losartan -hydrochlorothiazide  (HYZAAR ) 100-25 MG tablet Take 1 tablet by mouth daily. 90 tablet 1   0.9 %  sodium chloride  infusion      0.9 %  sodium chloride  infusion      No facility-administered medications prior to visit.     ROS Review of Systems  Constitutional:  Negative for activity change and appetite change.  HENT:  Negative for sinus pressure and sore throat.   Respiratory:  Negative for chest tightness, shortness of breath and wheezing.   Cardiovascular:  Negative for chest pain and palpitations.  Gastrointestinal:  Negative for abdominal distention, abdominal pain and constipation.  Genitourinary: Negative.   Musculoskeletal: Negative.   Psychiatric/Behavioral:  Negative for behavioral problems and dysphoric mood.     Objective:  BP 127/79   Pulse 88   Ht 6' 2 (1.88 m)   Wt (!) 310 lb 9.6 oz (140.9 kg)   SpO2 99%   BMI 39.88 kg/m      04/03/2023   10:54 AM 10/01/2022    9:42 AM 05/07/2022    9:20 AM  BP/Weight  Systolic BP 127 128 133  Diastolic BP 79 82 91  Wt. (Lbs) 310.6 308.6   BMI 39.88 kg/m2 39.62 kg/m2       Physical Exam Constitutional:      Appearance: He is well-developed.  Cardiovascular:     Rate and Rhythm: Normal rate.     Heart sounds: Normal heart sounds. No murmur heard. Pulmonary:     Effort: Pulmonary effort is normal.     Breath sounds: Normal breath sounds. No wheezing or rales.  Chest:     Chest wall: No tenderness.  Abdominal:     General: Bowel sounds are  normal. There is no distension.     Palpations: Abdomen is soft. There is no mass.     Tenderness: There is no abdominal tenderness.  Musculoskeletal:        General: Normal range of motion.     Right lower leg: No edema.     Left lower leg: No edema.  Neurological:     Mental Status: He is alert and oriented to person, place, and time.  Psychiatric:  Comments: Dysphoric mood        Latest Ref Rng & Units 10/01/2022   10:33 AM 03/20/2022    2:22 PM 09/18/2021    3:03 PM  CMP  Glucose 70 - 99 mg/dL 894  92  88   BUN 8 - 27 mg/dL 16  21  20    Creatinine 0.76 - 1.27 mg/dL 8.94  8.91  9.00   Sodium 134 - 144 mmol/L 140  142  142   Potassium 3.5 - 5.2 mmol/L 4.1  4.3  4.1   Chloride 96 - 106 mmol/L 100  102  101   CO2 20 - 29 mmol/L 26  29  24    Calcium  8.6 - 10.2 mg/dL 9.1  9.5  9.3   Total Protein 6.0 - 8.5 g/dL 6.9  7.2    Total Bilirubin 0.0 - 1.2 mg/dL 0.5  0.4    Alkaline Phos 44 - 121 IU/L 74  66    AST 0 - 40 IU/L 27  27    ALT 0 - 44 IU/L 25  30      Lipid Panel     Component Value Date/Time   CHOL 151 10/01/2022 1033   TRIG 98 10/01/2022 1033   HDL 56 10/01/2022 1033   CHOLHDL 2.7 11/06/2020 0841   CHOLHDL 4.4 01/07/2014 0921   VLDL 21 01/07/2014 0921   LDLCALC 77 10/01/2022 1033    CBC    Component Value Date/Time   WBC 7.2 10/01/2022 1033   WBC 8.9 05/27/2016 1316   RBC 4.81 10/01/2022 1033   RBC 5.04 05/27/2016 1316   HGB 13.4 10/01/2022 1033   HCT 41.6 10/01/2022 1033   PLT 227 10/01/2022 1033   MCV 87 10/01/2022 1033   MCH 27.9 10/01/2022 1033   MCH 28.2 05/27/2016 1316   MCHC 32.2 10/01/2022 1033   MCHC 33.6 05/27/2016 1316   RDW 13.9 10/01/2022 1033   LYMPHSABS 1.6 10/01/2022 1033   EOSABS 0.1 10/01/2022 1033   BASOSABS 0.0 10/01/2022 1033    Lab Results  Component Value Date   HGBA1C 6.4 04/03/2023    Assessment & Plan:      Alcohol Use Increased alcohol consumption due to emotional distress related to the loss of his brother  and his father's declining health. Patient has a therapist but has not been attending sessions recently. -Encouraged patient to resume therapy sessions. -Offered to refer to alcohol Anonymous but he declines. -He denies being depressed and declines medication -Advised patient to seek support from friends and engage in activities that he enjoys to help cope with stress.  Type 2 Diabetes Mellitus Well-controlled with an A1C of 6.4. No changes in medication needed. -Continue current management. -Counseled on Diabetic diet, my plate method, 849 minutes of moderate intensity exercise/week Blood sugar logs with fasting goals of 80-120 mg/dl, random of less than 819 and in the event of sugars less than 60 mg/dl or greater than 599 mg/dl encouraged to notify the clinic. Advised on the need for annual eye exams, annual foot exams, Pneumonia vaccine.   Hypertension and Hyperlipidemia Well-controlled. No changes in medication needed. -Continue current management.  Foot Health Calluses and abnormal toenail growth noted, possibly due to excessive sweating from work boots. -Referral to a podiatrist for further evaluation and treatment.  General Health Maintenance -Encouraged patient to have an annual eye exam due to diabetes. -Plan to check cholesterol at next visit when patient is fasting.  Meds ordered this encounter  Medications   amLODipine  (NORVASC ) 10 MG tablet    Sig: Take 1 tablet (10 mg total) by mouth daily.    Dispense:  90 tablet    Refill:  1   atorvastatin  (LIPITOR) 20 MG tablet    Sig: Take 1 tablet (20 mg total) by mouth daily.    Dispense:  90 tablet    Refill:  1   losartan -hydrochlorothiazide  (HYZAAR ) 100-25 MG tablet    Sig: Take 1 tablet by mouth daily.    Dispense:  90 tablet    Refill:  1    Follow-up: Return in about 6 months (around 10/01/2023) for Chronic medical conditions.       Corrina Sabin, MD, FAAFP. [facility] and  [facility] [place], [place] [phone]   04/03/2023, 11:22 AM

## 2023-04-03 NOTE — Patient Instructions (Signed)
 VISIT SUMMARY:  During today's visit, we discussed your increased alcohol consumption, which you attribute to the recent loss of your brother and your father's declining health. We also reviewed your well-controlled diabetes, hypertension, and hyperlipidemia. Additionally, we addressed your foot health and general health maintenance needs.  YOUR PLAN:  -ALCOHOL USE: You have been drinking more due to emotional distress. It's important to resume your therapy sessions with Mr. Christobal and seek support from friends. Engaging in activities you enjoy can also help manage your stress.  -TYPE 2 DIABETES MELLITUS: Your diabetes is well-controlled with an A1C of 6.4. No changes in your medication are needed at this time. Continue with your current management plan.  -HYPERTENSION AND HYPERLIPIDEMIA: Both your blood pressure and cholesterol levels are well-controlled. Continue with your current medications and management plan.  -FOOT HEALTH: You have developed calluses and problematic toenails, possibly due to excessive sweating from work boots. A referral to a podiatrist has been made for further evaluation and treatment.  -GENERAL HEALTH MAINTENANCE: It's important to have an annual eye exam due to your diabetes. We will also check your cholesterol levels at your next visit when you are fasting.  INSTRUCTIONS:  Please make an appointment with your therapist, Mr. Christobal, and consider reaching out to your friends for support. Schedule a visit with a podiatrist for your foot health. Also, remember to have an annual eye exam and plan to check your cholesterol levels at your next visit when you are fasting.

## 2023-04-07 LAB — CMP14+EGFR
ALT: 23 [IU]/L (ref 0–44)
AST: 28 [IU]/L (ref 0–40)
Albumin: 4 g/dL (ref 3.9–4.9)
Alkaline Phosphatase: 78 [IU]/L (ref 44–121)
BUN/Creatinine Ratio: 11 (ref 10–24)
BUN: 12 mg/dL (ref 8–27)
Bilirubin Total: 0.6 mg/dL (ref 0.0–1.2)
CO2: 25 mmol/L (ref 20–29)
Calcium: 9.2 mg/dL (ref 8.6–10.2)
Chloride: 101 mmol/L (ref 96–106)
Creatinine, Ser: 1.05 mg/dL (ref 0.76–1.27)
Globulin, Total: 3 g/dL (ref 1.5–4.5)
Glucose: 98 mg/dL (ref 70–99)
Potassium: 4.8 mmol/L (ref 3.5–5.2)
Sodium: 140 mmol/L (ref 134–144)
Total Protein: 7 g/dL (ref 6.0–8.5)
eGFR: 80 mL/min/{1.73_m2} (ref >59)

## 2023-04-07 LAB — MICROALBUMIN / CREATININE URINE RATIO
Microalb/Creat Ratio: 13 mg/g{creat} (ref 0–29)
Microalbumin, Urine: 128.8 ug/mL
Microalbumin, Urine: 16.5 ug/mL

## 2023-04-16 ENCOUNTER — Other Ambulatory Visit: Payer: Self-pay

## 2023-04-18 ENCOUNTER — Ambulatory Visit: Payer: Self-pay | Admitting: Licensed Clinical Social Worker

## 2023-04-18 NOTE — Patient Instructions (Signed)
Visit Information  Thank you for taking time to visit with me today. Please don't hesitate to contact me if I can be of assistance to you.   Following are the goals we discussed today:   Goals Addressed             This Visit's Progress    Care Coordination Activities       Care Coordination Interventions: Patient stated that he works full time and he and his adult son live together and he feels that after paying bills each month that he runs out of food. Patient stated that he has not applied for food stamps because he feels that he will not qualify due to his job and that he might make to much money. SW educated the patient on the GGFF (Greater Music therapist) and SW will mail the patient the some food pantry resources and a paper copy of the food stamp application to review. SW will follow up on 05/12/2023 at 3:00 pm        Our next appointment is by telephone on 05/12/2023 at 3:00 pm  Please call the care guide team at [phone] if you need to cancel or reschedule your appointment.   If you are experiencing a Mental Health or Behavioral Health Crisis or need someone to talk to, please call the Suicide and Crisis Lifeline: 988 go to [facility] Urgent [facility] [address], [place] [phone]) call 911  Patient verbalizes understanding of instructions and care plan provided today and agrees to view in MyChart. Active MyChart status and patient understanding of how to access instructions and care plan via MyChart confirmed with patient.     Jeanie Cooks, PhD [facility], [facility] Social Worker Direct Dial: [phone]  Fax: [phone]

## 2023-04-18 NOTE — Patient Outreach (Signed)
  Care Coordination   Initial Visit Note   04/18/2023 Name: Yaiden Yang MRN: [id number] DOB: [birth year]  Zadkiel Dragan is a 63 y.o. year old male who sees Hoy Register, MD for primary care. I spoke with  Kaedyn Varas by phone today.  What matters to the patients health and wellness today?  Food Insecurities     Goals Addressed             This Visit's Progress    Care Coordination Activities       Care Coordination Interventions: Patient stated that he works full time and he and his adult son live together and he feels that after paying bills each month that he runs out of food. Patient stated that he has not applied for food stamps because he feels that he will not qualify due to his job and that he might make to much money. SW educated the patient on the GGFF (Greater Music therapist) and SW will mail the patient the some food pantry resources and a paper copy of the food stamp application to review. SW will follow up on 05/12/2023 at 3:00 pm        SDOH assessments and interventions completed:  Yes  SDOH Interventions Today    Flowsheet Row Most Recent Value  SDOH Interventions   Food Insecurity Interventions Other (Comment), Community Resources Provided  [Does not want to apply to [employer] SW will mail resources for Food pantry]  Housing Interventions Intervention Not Indicated  Transportation Interventions Intervention Not Indicated  Utilities Interventions Intervention Not Indicated        Care Coordination Interventions:  Yes, provided   Follow up plan: Follow up call scheduled for 05/12/2023 at 3:00 pm    Encounter Outcome:  Patient Visit Completed   Jeanie Cooks, PhD [facility], [facility] Social Worker Direct Dial: [phone]  Fax: [phone]

## 2023-04-26 ENCOUNTER — Telehealth: Payer: BC Managed Care – PPO | Admitting: Family Medicine

## 2023-04-26 DIAGNOSIS — K047 Periapical abscess without sinus: Secondary | ICD-10-CM | POA: Diagnosis not present

## 2023-04-26 MED ORDER — CLINDAMYCIN HCL 300 MG PO CAPS: 300.0000 mg | ORAL_CAPSULE | Freq: Three times a day (TID) | ORAL | 0 refills | Status: AC

## 2023-04-26 NOTE — Progress Notes (Signed)

## 2023-04-28 ENCOUNTER — Ambulatory Visit: Payer: BC Managed Care – PPO | Admitting: Podiatry

## 2023-05-09 ENCOUNTER — Ambulatory Visit: Payer: BC Managed Care – PPO | Admitting: Podiatry

## 2023-05-12 ENCOUNTER — Encounter: Payer: Self-pay | Admitting: Licensed Clinical Social Worker

## 2023-05-14 ENCOUNTER — Other Ambulatory Visit: Payer: Self-pay

## 2023-05-16 ENCOUNTER — Ambulatory Visit: Payer: Self-pay | Admitting: Licensed Clinical Social Worker

## 2023-05-16 NOTE — Patient Outreach (Signed)
 Care Coordination   05/16/2023 Name: Seth Weaver MRN: [id number] DOB: 1960-09-07   Care Coordination Outreach Attempts:  An unsuccessful outreach was attempted for an appointment today.  Follow Up Plan:  Additional outreach attempts will be made to offer the patient complex care management information and services.   Encounter Outcome:  No Answer   Care Coordination Interventions:  No, not indicated    SW attempted to do a follow up on food resources mailed and will attempt to call again on 06/04/2023 at 1:00 pm  Jeanie Cooks, PhD [facility], [facility] Social Worker Direct Dial: [phone]  Fax: [phone]

## 2023-06-04 ENCOUNTER — Ambulatory Visit: Payer: Self-pay | Admitting: Licensed Clinical Social Worker

## 2023-06-04 NOTE — Patient Outreach (Signed)
 Care Coordination   Follow Up Visit Note   06/04/2023 Name: Seth Weaver MRN: [id number] DOB: [birth year]  Seth Weaver is a 63 y.o. year old male who sees Hoy Register, MD for primary care. I spoke with  Caley Pangelinan by phone today.  What matters to the patients health and wellness today?  Food Insecurities Patient received the food pantry resources in the mail and no further follow up needed.     Goals Addressed             This Visit's Progress    COMPLETED: Care Coordination Activities       Care Coordination Interventions: Patient stated that he works full time and he and his adult son live together and he feels that after paying bills each month that he runs out of food. Patient stated that he has not applied for food stamps because he feels that he will not qualify due to his job and that he might make to much money. SW educated the patient on the GGFF (Greater Music therapist) and SW will mail the patient the some food pantry resources and a paper copy of the food stamp application to review. SW will follow up on 05/12/2023 at 3:00 pm        SDOH assessments and interventions completed:  Yes  SDOH Interventions Today    Flowsheet Row Most Recent Value  SDOH Interventions   Food Insecurity Interventions Intervention Not Indicated  Housing Interventions Intervention Not Indicated  Transportation Interventions Intervention Not Indicated  Utilities Interventions Intervention Not Indicated        Care Coordination Interventions:  Yes, provided  Interventions Today    Flowsheet Row Most Recent Value  General Interventions   General Interventions Discussed/Reviewed General Interventions Reviewed, Science writer received the food pantry resources in the mail and no further follow up needed.]        Follow up plan: No further intervention required.   Encounter Outcome:  Patient Visit Completed   Jeanie Cooks, PhD [facility], [facility] Social Worker Direct Dial: [phone]  Fax: [phone]

## 2023-06-04 NOTE — Patient Instructions (Signed)
 Visit Information  Thank you for taking time to visit with me today. Please don't hesitate to contact me if I can be of assistance to you.   Following are the goals we discussed today:   Goals Addressed             This Visit's Progress    COMPLETED: Care Coordination Activities       Care Coordination Interventions: Patient stated that he works full time and he and his adult son live together and he feels that after paying bills each month that he runs out of food. Patient stated that he has not applied for food stamps because he feels that he will not qualify due to his job and that he might make to much money. SW educated the patient on the GGFF (Greater Music therapist) and SW will mail the patient the some food pantry resources and a paper copy of the food stamp application to review. SW will follow up on 05/12/2023 at 3:00 pm        No further follow up needed, SW encouraged patient to contact their PCP to schedule for SW   Please call the care guide team at [phone] if you need to cancel or reschedule your appointment.   If you are experiencing a Mental Health or Behavioral Health Crisis or need someone to talk to, please call the Suicide and Crisis Lifeline: 988 go to [facility] Urgent [facility] [address], [place] [phone]) call 911  Patient verbalizes understanding of instructions and care plan provided today and agrees to view in MyChart. Active MyChart status and patient understanding of how to access instructions and care plan via MyChart confirmed with patient.     Jeanie Cooks, PhD [facility], [facility] Social Worker Direct Dial: [phone]  Fax: [phone]

## 2023-07-13 ENCOUNTER — Encounter: Payer: Self-pay | Admitting: Family Medicine

## 2023-07-14 NOTE — Telephone Encounter (Signed)
 Will forward to provider

## 2023-07-28 ENCOUNTER — Ambulatory Visit: Attending: Family Medicine | Admitting: Family Medicine

## 2023-07-28 ENCOUNTER — Telehealth: Payer: Self-pay

## 2023-07-28 ENCOUNTER — Other Ambulatory Visit: Payer: Self-pay

## 2023-07-28 ENCOUNTER — Encounter: Payer: Self-pay | Admitting: Family Medicine

## 2023-07-28 DIAGNOSIS — F1010 Alcohol abuse, uncomplicated: Secondary | ICD-10-CM

## 2023-07-28 DIAGNOSIS — E1169 Type 2 diabetes mellitus with other specified complication: Secondary | ICD-10-CM

## 2023-07-28 DIAGNOSIS — Z7985 Long-term (current) use of injectable non-insulin antidiabetic drugs: Secondary | ICD-10-CM | POA: Diagnosis not present

## 2023-07-28 MED ORDER — OZEMPIC (0.25 OR 0.5 MG/DOSE) 2 MG/3ML ~~LOC~~ SOPN
0.2500 mg | PEN_INJECTOR | SUBCUTANEOUS | 0 refills | Status: DC
  Filled 2023-07-28: qty 3, 42d supply, fill #0

## 2023-07-28 MED ORDER — OZEMPIC (0.25 OR 0.5 MG/DOSE) 2 MG/3ML ~~LOC~~ SOPN
0.5000 mg | PEN_INJECTOR | SUBCUTANEOUS | 6 refills | Status: DC
  Filled 2023-07-28: qty 3, fill #0
  Filled 2023-10-25: qty 3, 28d supply, fill #0
  Filled 2023-11-28: qty 3, 28d supply, fill #1

## 2023-07-28 NOTE — Progress Notes (Signed)
 Virtual Visit via Video Note  I connected with Seth Weaver, on 07/28/2023 at 1:52 PM by video enabled telemedicine device and verified that I am speaking with the correct person using two identifiers.   Consent: I discussed the limitations, risks, security and privacy concerns of performing an evaluation and management service by telemedicine and the availability of in person appointments. I also discussed with the patient that there may be a patient responsible charge related to this service. The patient expressed understanding and agreed to proceed.   Location of Patient: Out and about  Location of Provider: Clinic   Persons participating in Telemedicine visit: Davonne Bruso Dr. Adan Holms    Discussed the use of AI scribe software for clinical note transcription with the patient, who gave verbal consent to proceed.  History of Present Illness Seth Weaver is a 63 year old male with a history of type 2 diabetes mellitus (diet-controlled with A1c 6.4), hypertension, hyperlipidemia who presents for weight management.  He has been abstinent from alcohol for five months, influenced by his wife's encouragement after concerning blood test results. His liver and kidney functions were normal on the last test three months ago. Since stopping alcohol, he has gained weight due to increased consumption of sweets. He seeks assistance with weight management. His recent A1c is 6.4, and he has previously used metformin  for diabetes management.      Past Medical History:  Diagnosis Date   Diabetes mellitus without complication (HCC)    Essential hypertension 02/22/2014   Hyperlipidemia    Hypertension    Inguinal hernia 10/07/2014   No Known Allergies  Current Outpatient Medications on File Prior to Visit  Medication Sig Dispense Refill   amLODipine  (NORVASC ) 10 MG tablet Take 1 tablet (10 mg total) by mouth daily. 90 tablet 1   atorvastatin  (LIPITOR) 20 MG tablet Take 1 tablet (20 mg  total) by mouth daily. 90 tablet 1   Blood Glucose Monitoring Suppl (BLOOD GLUCOSE METER KIT AND SUPPLIES) Dispense based on patient and insurance preference. Test blood sugar once daily as directed. (FOR ICD-9 250.00, 250.01). 1 each 0   losartan -hydrochlorothiazide  (HYZAAR ) 100-25 MG tablet Take 1 tablet by mouth daily. 90 tablet 1   No current facility-administered medications on file prior to visit.    ROS: See HPI  Observations/Objective: Awake, alert, oriented x3 Not in acute distress Normal mood      Latest Ref Rng & Units 04/03/2023   11:35 AM 10/01/2022   10:33 AM 03/20/2022    2:22 PM  CMP  Glucose 70 - 99 mg/dL 98  161  92   BUN 8 - 27 mg/dL 12  16  21    Creatinine 0.76 - 1.27 mg/dL 0.96  0.45  4.09   Sodium 134 - 144 mmol/L 140  140  142   Potassium 3.5 - 5.2 mmol/L 4.8  4.1  4.3   Chloride 96 - 106 mmol/L 101  100  102   CO2 20 - 29 mmol/L 25  26  29    Calcium  8.6 - 10.2 mg/dL 9.2  9.1  9.5   Total Protein 6.0 - 8.5 g/dL 7.0  6.9  7.2   Total Bilirubin 0.0 - 1.2 mg/dL 0.6  0.5  0.4   Alkaline Phos 44 - 121 IU/L 78  74  66   AST 0 - 40 IU/L 28  27  27    ALT 0 - 44 IU/L 23  25  30      Lipid Panel  Component Value Date/Time   CHOL 151 10/01/2022 1033   TRIG 98 10/01/2022 1033   HDL 56 10/01/2022 1033   CHOLHDL 2.7 11/06/2020 0841   CHOLHDL 4.4 01/07/2014 0921   VLDL 21 01/07/2014 0921   LDLCALC 77 10/01/2022 1033   LABVLDL 18 10/01/2022 1033    Lab Results  Component Value Date   HGBA1C 6.4 04/03/2023      Assessment & Plan Type 2 Diabetes Mellitus associated with morbid obesity A1c 6.4. Weight gain post-alcohol cessation with increased sweets intake. Discussed GLP-1 receptor agonists for weight and glycemic control.  - Discussed available options  reviewed side effects and dosing strategy. No thyroid cancer history. - Prescribe Ozempic . Send to pharmacy. - Start Ozempic  0.25 mg weekly for 4 weeks, then 0.5 mg weekly. - Pharmacist to verify  insurance coverage and inform him. - Consider prior oral medications if insurance mandates. - Monitor for nausea and vomiting.  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 No liver or kidney damage. Commended sobriety. - Continue alcohol abstinence. - Support sobriety efforts.      Meds ordered this encounter  Medications   Semaglutide ,0.25 or 0.5MG /DOS, (OZEMPIC , 0.25 OR 0.5 MG/DOSE,) 2 MG/3ML SOPN    Sig: Inject 0.25 mg into the skin once a week. For 4 weeks then increase to 0.5mg     Dispense:  3 mL    Refill:  0    Previously tried Metformin    Semaglutide ,0.25 or 0.5MG /DOS, (OZEMPIC , 0.25 OR 0.5 MG/DOSE,) 2 MG/3ML SOPN    Sig: Inject 0.5 mg into the skin once a week.    Dispense:  3 mL    Refill:  6    Follow Up Instructions: Keep previously scheduled appointment   I discussed the assessment and treatment plan with the patient. The patient was provided an opportunity to ask questions and all were answered. The patient agreed with the plan and demonstrated an understanding of the instructions.   The patient was advised to call back or seek an in-person evaluation if the symptoms worsen or if the condition fails to improve as anticipated.     I provided 12 minutes total of Telehealth time during this encounter including median intraservice time, reviewing previous notes, investigations, ordering medications, medical decision making, coordinating care and patient verbalized understanding at the end of the visit.     Joaquin Mulberry, MD, FAAFP. [facility] [place], [place] [phone]   07/28/2023, 1:52 PM

## 2023-07-28 NOTE — Telephone Encounter (Signed)
 Pharmacy Patient Advocate Encounter   Received notification from CoverMyMeds that prior authorization for OZEMPIC  is required/requested.   Insurance verification completed.   The patient is insured through [facility] .   Per test claim: PA required; PA submitted to above mentioned insurance via CoverMyMeds Key/confirmation #/EOC [id number] Status is pending

## 2023-07-28 NOTE — Patient Instructions (Signed)
 Semaglutide Injection What is this medication? SEMAGLUTIDE (SEM a GLOO tide) treats type 2 diabetes. It works by increasing insulin levels in your body, which decreases your blood sugar (glucose). It also reduces the amount of sugar released into your blood and slows down your digestion. It may also be used to lower the risk of heart attack and stroke in people with type 2 diabetes. Changes to diet and exercise are often combined with this medication. This medicine may be used for other purposes; ask your health care provider or pharmacist if you have questions. COMMON BRAND NAME(S): OZEMPIC What should I tell my care team before I take this medication? They need to know if you have any of these conditions: Eye disease caused by diabetes Gallbladder disease Have or have had pancreatitis Having surgery Kidney disease Personal or family history of MEN 2, a condition that causes endocrine gland tumors Personal or family history of thyroid cancer Stomach or intestine problems, such as problems digesting food An unusual or allergic reaction to semaglutide, other medications, foods, dyes, or preservatives Pregnant or trying to get pregnant Breastfeeding How should I use this medication? This medication is for injection under the skin of your upper leg (thigh), stomach area, or upper arm. It is given once every week (every 7 days). You will be taught how to prepare and give this medication. Use exactly as directed. Take your medication at regular intervals. Do not take it more often than directed. If you use this medication with insulin, you should inject this medication and the insulin separately. Do not mix them together. Do not give the injections right next to each other. Change (rotate) injection sites with each injection. It is important that you put your used needles and syringes in a special sharps container. Do not put them in a trash can. If you do not have a sharps container, call your  pharmacist or care team to get one. A special MedGuide will be given to you by the pharmacist with each prescription and refill. Be sure to read this information carefully each time. This medication comes with INSTRUCTIONS FOR USE. Ask your pharmacist for directions on how to use this medication. Read the information carefully. Talk to your pharmacist or care team if you have questions. Talk to your care team about the use of this medication in children. Special care may be needed. Overdosage: If you think you have taken too much of this medicine contact a poison control center or emergency room at once. NOTE: This medicine is only for you. Do not share this medicine with others. What if I miss a dose? If you miss a dose, take it as soon as you can within 5 days after the missed dose. Then take your next dose at your regular weekly time. If it has been longer than 5 days after the missed dose, do not take the missed dose. Take the next dose at your regular time. Do not take double or extra doses. If you have questions about a missed dose, contact your care team for advice. What may interact with this medication? Some medications may affect your blood sugar levels or hide the symptoms of low blood sugar (hypoglycemia). Talk with your care team about all the medications you take. They may suggest changes to your insulin dose or checking your blood sugar levels more often. Medications that may affect your blood sugar levels include: Alcohol Certain antibiotics, such as ciprofloxacin, levofloxacin, sulfamethoxazole; trimethoprim Certain medications for blood pressure or heart  disease, such as benazepril, enalapril, lisinopril, losartan, valsartan Certain medications for mental health conditions, such as fluoxetine or olanzapine Diuretics, such as hydrochlorothiazide (HCTZ) Estrogen and progestin hormones Other medications for diabetes Steroid medications, such as prednisone or  cortisone Testosterone Thyroid hormones Medications that may mask symptoms of low blood sugar include: Beta blockers, such as atenolol, metoprolol, propranolol Clonidine Guanethidine Reserpine This list may not describe all possible interactions. Give your health care provider a list of all the medicines, herbs, non-prescription drugs, or dietary supplements you use. Also tell them if you smoke, drink alcohol, or use illegal drugs. Some items may interact with your medicine. What should I watch for while using this medication? Visit your care team for regular checks on your progress. Tell your care team if your symptoms do not start to get better or if they get worse. You may need blood work done while you are taking this medication. Your care team will monitor your HbA1C (A1C). This test shows what your average blood sugar (glucose) level was over the past 2 to 3 months. Know the symptoms of low blood sugar and know how to treat it. Always carry a source of quick sugar with you. Examples include hard sugar candy or glucose tablets. Make sure others know that you can choke if you eat or drink if your blood sugar is too low and you are unable to care for yourself. Get medical help at once. Tell your care team if you have high blood sugar. Your medication dose may change if your body is under stress. Some types of stress that may affect your blood sugar include fever, infection, and surgery. Do not share pens or cartridges with anyone, even if the needle is changed. Each pen should only be used by one person. Sharing could cause an infection. Wear a medical ID bracelet or chain. Carry a card that describes your condition. List the medications and doses you take on the card. Talk to your care team about your risk of cancer. You may be more at risk for certain types of cancer if you take this medication. Talk to your care team right away if you have a lump or swelling in your neck, hoarseness that does  not go away, trouble swallowing, shortness of breath, or trouble breathing. Make sure you stay hydrated while taking this medication. Drink water often. Eat fruits and veggies that have a high water content. Drink more water when it is hot or you are active. Talk to your care team right away if you have fever, infection, vomiting, diarrhea, or if you sweat a lot while taking this medication. The loss of too much body fluid may make it dangerous for you to take this medication. If you are going to need surgery or a procedure, tell your care team that you are taking this medication. What side effects may I notice from receiving this medication? Side effects that you should report to your care team as soon as possible: Allergic reactions--skin rash, itching, hives, swelling of the face, lips, tongue, or throat Change in vision Dehydration--increased thirst, dry mouth, feeling faint or lightheaded, headache, dark yellow or brown urine Gallbladder problems--severe stomach pain, nausea, vomiting, fever Heart palpitations--rapid, pounding, or irregular heartbeat Kidney injury--decrease in the amount of urine, swelling of the ankles, hands, or feet Pancreatitis--severe stomach pain that spreads to your back or gets worse after eating or when touched, fever, nausea, vomiting Thoughts of suicide or self-harm, worsening mood, feelings of depression Thyroid cancer--new mass  or lump in the neck, pain or trouble swallowing, trouble breathing, hoarseness Side effects that usually do not require medical attention (report these to your care team if they continue or are bothersome): Diarrhea Loss of appetite Nausea Upset stomach This list may not describe all possible side effects. Call your doctor for medical advice about side effects. You may report side effects to FDA at 1-800-FDA-1088. Where should I keep my medication? Keep out of the reach of children. Store unopened pens in a refrigerator between 2 and 8  degrees C (36 and 46 degrees F). Do not freeze. Protect from light and heat. After you first use the pen, it can be stored for 56 days at room temperature between 15 and 30 degrees C (59 and 86 degrees F) or in a refrigerator. Throw away your used pen after 56 days or after the expiration date, whichever comes first. Do not store your pen with the needle attached. If the needle is left on, medication may leak from the pen. NOTE: This sheet is a summary. It may not cover all possible information. If you have questions about this medicine, talk to your doctor, pharmacist, or health care provider.  2024 Elsevier/Gold Standard (2023-02-21 00:00:00)

## 2023-07-29 ENCOUNTER — Other Ambulatory Visit: Payer: Self-pay

## 2023-07-29 ENCOUNTER — Telehealth: Payer: Self-pay

## 2023-07-29 NOTE — Telephone Encounter (Signed)
 Pharmacy Patient Advocate Encounter  Received notification from [facility] that Prior Authorization for OZEMPIC  has been APPROVED from 07/28/2023 to 07/27/2024   PA #/Case ID/Reference #: [id number]

## 2023-07-30 ENCOUNTER — Other Ambulatory Visit: Payer: Self-pay

## 2023-08-12 ENCOUNTER — Other Ambulatory Visit: Payer: Self-pay

## 2023-08-24 ENCOUNTER — Encounter: Payer: Self-pay | Admitting: Family Medicine

## 2023-08-25 ENCOUNTER — Other Ambulatory Visit: Payer: Self-pay

## 2023-08-25 ENCOUNTER — Encounter: Payer: Self-pay | Admitting: Family Medicine

## 2023-08-25 ENCOUNTER — Other Ambulatory Visit: Payer: Self-pay | Admitting: Family Medicine

## 2023-08-25 DIAGNOSIS — E1159 Type 2 diabetes mellitus with other circulatory complications: Secondary | ICD-10-CM

## 2023-08-25 DIAGNOSIS — E11628 Type 2 diabetes mellitus with other skin complications: Secondary | ICD-10-CM

## 2023-08-25 MED ORDER — AMLODIPINE BESYLATE 10 MG PO TABS
10.0000 mg | ORAL_TABLET | Freq: Every day | ORAL | 1 refills | Status: DC
  Filled 2023-08-25 (×2): qty 90, 90d supply, fill #0
  Filled 2023-11-28: qty 90, 90d supply, fill #1

## 2023-08-25 MED ORDER — LOSARTAN POTASSIUM-HCTZ 100-25 MG PO TABS
1.0000 | ORAL_TABLET | Freq: Every day | ORAL | 1 refills | Status: DC
  Filled 2023-08-25 (×2): qty 90, 90d supply, fill #0
  Filled 2023-11-28: qty 90, 90d supply, fill #1

## 2023-08-26 ENCOUNTER — Other Ambulatory Visit: Payer: Self-pay

## 2023-08-26 DIAGNOSIS — E11628 Type 2 diabetes mellitus with other skin complications: Secondary | ICD-10-CM

## 2023-08-26 MED ORDER — ATORVASTATIN CALCIUM 20 MG PO TABS
20.0000 mg | ORAL_TABLET | Freq: Every day | ORAL | 1 refills | Status: DC
  Filled 2023-08-26 (×2): qty 90, 90d supply, fill #0
  Filled 2023-11-28: qty 90, 90d supply, fill #1

## 2023-10-24 ENCOUNTER — Other Ambulatory Visit: Payer: Self-pay

## 2023-10-24 ENCOUNTER — Other Ambulatory Visit: Payer: Self-pay | Admitting: Family Medicine

## 2023-10-24 ENCOUNTER — Encounter: Payer: Self-pay | Admitting: Family Medicine

## 2023-10-24 MED ORDER — TIZANIDINE HCL 4 MG PO TABS
4.0000 mg | ORAL_TABLET | Freq: Three times a day (TID) | ORAL | 0 refills | Status: AC | PRN
Start: 2023-10-24 — End: ?
  Filled 2023-10-24: qty 60, 20d supply, fill #0

## 2023-10-27 ENCOUNTER — Other Ambulatory Visit: Payer: Self-pay

## 2023-10-31 ENCOUNTER — Telehealth: Payer: Self-pay | Admitting: Family Medicine

## 2023-10-31 ENCOUNTER — Other Ambulatory Visit: Payer: Self-pay

## 2023-10-31 NOTE — Telephone Encounter (Signed)
 Pt came by the office to sch appt he confirmed appt for Monday 8/8

## 2023-10-31 NOTE — Telephone Encounter (Signed)
 Pt came by office sch appt confirmed in person

## 2023-11-03 ENCOUNTER — Ambulatory Visit: Attending: Family Medicine | Admitting: Family Medicine

## 2023-11-03 ENCOUNTER — Encounter: Payer: Self-pay | Admitting: Family Medicine

## 2023-11-03 ENCOUNTER — Other Ambulatory Visit: Payer: Self-pay

## 2023-11-03 DIAGNOSIS — M5441 Lumbago with sciatica, right side: Secondary | ICD-10-CM

## 2023-11-03 DIAGNOSIS — E1169 Type 2 diabetes mellitus with other specified complication: Secondary | ICD-10-CM | POA: Diagnosis not present

## 2023-11-03 DIAGNOSIS — F1091 Alcohol use, unspecified, in remission: Secondary | ICD-10-CM

## 2023-11-03 DIAGNOSIS — Z7985 Long-term (current) use of injectable non-insulin antidiabetic drugs: Secondary | ICD-10-CM

## 2023-11-03 DIAGNOSIS — I152 Hypertension secondary to endocrine disorders: Secondary | ICD-10-CM

## 2023-11-03 DIAGNOSIS — E1159 Type 2 diabetes mellitus with other circulatory complications: Secondary | ICD-10-CM | POA: Diagnosis not present

## 2023-11-03 DIAGNOSIS — L84 Corns and callosities: Secondary | ICD-10-CM

## 2023-11-03 LAB — POCT GLYCOSYLATED HEMOGLOBIN (HGB A1C): HbA1c, POC (controlled diabetic range): 6.5 % (ref 0.0–7.0)

## 2023-11-03 MED ORDER — OZEMPIC (0.25 OR 0.5 MG/DOSE) 2 MG/3ML ~~LOC~~ SOPN
0.2500 mg | PEN_INJECTOR | SUBCUTANEOUS | 0 refills | Status: DC
  Filled 2023-11-03: qty 3, 42d supply, fill #0
  Filled 2023-11-28: qty 3, 28d supply, fill #0

## 2023-11-03 NOTE — Progress Notes (Signed)
 Subjective:  Patient ID: Seth Weaver, male    DOB: 07-19-1960  Age: 63 y.o. MRN: [id number]  CC: Medical Management of Chronic Issues (Back pain)     Discussed the use of AI scribe software for clinical note transcription with the patient, who gave verbal consent to proceed.  History of Present Illness Seth Weaver is a 63 year old male a history of type 2 diabetes mellitus , hypertension, hyperlipidemia who presents with back pain and medication management issues.  He experiences back pain localized to the right side of his lower back, occasionally radiating down his leg. The pain worsens with bending. He uses Tylenol 650 mg and Icy Hot for relief, which he finds helpful. Tizanidine  was tried but was not effective.  He does a lot of heavy lifting at his job.  He has medication management issues following a recent move in June, during which he lost several medications, including those for blood pressure and cholesterol. These prescriptions have since been refilled. He recently obtained a new prescription for Ozempic  but is unsure of the dosage. Endorses adherence with his antihypertensives. Significant other is concerned about his prostate health due to a slight increase in PSA levels from 0.8 to 1.5, though it remains within the normal range. No urinary symptoms such as burning, hematuria, or increased frequency are present.    Past Medical History:  Diagnosis Date   Diabetes mellitus without complication (HCC)    Essential hypertension 02/22/2014   Hyperlipidemia    Hypertension    Inguinal hernia 10/07/2014    Past Surgical History:  Procedure Laterality Date   HERNIA REPAIR      Family History  Problem Relation Age of Onset   Diabetes Father    Hypertension Father    Colon cancer Paternal Uncle    Colon polyps Neg Hx    Esophageal cancer Neg Hx    Rectal cancer Neg Hx    Stomach cancer Neg Hx     Social History   Socioeconomic History   Marital status: Married     Spouse name: Not on file   Number of children: Not on file   Years of education: Not on file   Highest education level: Not on file  Occupational History   Not on file  Tobacco Use   Smoking status: Never   Smokeless tobacco: Never  Vaping Use   Vaping status: Never Used  Substance and Sexual Activity   Alcohol use: No    Comment: Pt states he used to drink but quit approximately 2 months ago.    Drug use: No   Sexual activity: Not on file  Other Topics Concern   Not on file  Social History Narrative   Not on file   Social Drivers of Health   Financial Resource Strain: Medium Risk (04/03/2023)   Overall Financial Resource Strain (CARDIA)    Difficulty of Paying Living Expenses: Somewhat hard  Food Insecurity: No Food Insecurity (06/04/2023)   Hunger Vital Sign    Worried About Running Out of Food in the Last Year: Never true    Ran Out of Food in the Last Year: Never true  Recent Concern: Food Insecurity - Food Insecurity Present (04/18/2023)   Hunger Vital Sign    Worried About Running Out of Food in the Last Year: Sometimes true    Ran Out of Food in the Last Year: Sometimes true  Transportation Needs: No Transportation Needs (06/04/2023)   PRAPARE - Transportation    Lack  of Transportation (Medical): No    Lack of Transportation (Non-Medical): No  Physical Activity: Patient Declined (04/03/2023)   Exercise Vital Sign    Days of Exercise per Week: Patient declined    Minutes of Exercise per Session: Patient declined  Stress: No Stress Concern Present (04/03/2023)   [employer] of Occupational Health - Occupational Stress Questionnaire    Feeling of Stress : Not at all  Social Connections: Socially Integrated (04/03/2023)   Social Connection and Isolation Panel    Frequency of Communication with Friends and Family: Once a week    Frequency of Social Gatherings with Friends and Family: Twice a week    Attends Religious Services: More than 4 times per year    Active  Member of [employer] or Organizations: Yes    Attends Engineer, structural: More than 4 times per year    Marital Status: Married    No Known Allergies  Outpatient Medications Prior to Visit  Medication Sig Dispense Refill   amLODipine  (NORVASC ) 10 MG tablet Take 1 tablet (10 mg total) by mouth daily. 90 tablet 1   atorvastatin  (LIPITOR) 20 MG tablet Take 1 tablet (20 mg total) by mouth daily. 90 tablet 1   Blood Glucose Monitoring Suppl (BLOOD GLUCOSE METER KIT AND SUPPLIES) Dispense based on patient and insurance preference. Test blood sugar once daily as directed. (FOR ICD-9 250.00, 250.01). 1 each 0   losartan -hydrochlorothiazide  (HYZAAR ) 100-25 MG tablet Take 1 tablet by mouth daily. 90 tablet 1   tiZANidine  (ZANAFLEX ) 4 MG tablet Take 1 tablet (4 mg total) by mouth every 8 (eight) hours as needed. 60 tablet 0   Semaglutide ,0.25 or 0.5MG /DOS, (OZEMPIC , 0.25 OR 0.5 MG/DOSE,) 2 MG/3ML SOPN Inject 0.5 mg into the skin once a week. (Patient not taking: Reported on 11/03/2023) 3 mL 6   Semaglutide ,0.25 or 0.5MG /DOS, (OZEMPIC , 0.25 OR 0.5 MG/DOSE,) 2 MG/3ML SOPN Inject 0.25 mg into the skin once a week. For 4 weeks then increase to 0.5mg  (Patient not taking: Reported on 11/03/2023) 3 mL 0   No facility-administered medications prior to visit.     ROS Review of Systems  Constitutional:  Negative for activity change and appetite change.  HENT:  Negative for sinus pressure and sore throat.   Respiratory:  Negative for chest tightness, shortness of breath and wheezing.   Cardiovascular:  Negative for chest pain and palpitations.  Gastrointestinal:  Negative for abdominal distention, abdominal pain and constipation.  Genitourinary: Negative.   Musculoskeletal:  Positive for back pain.  Psychiatric/Behavioral:  Negative for behavioral problems and dysphoric mood.     Objective:  BP 127/86   Pulse 85   Ht 6' 2 (1.88 m)   Wt (!) 305 lb 9.6 oz (138.6 kg)   SpO2 99%   BMI 39.24 kg/m       11/03/2023   11:30 AM 11/03/2023   11:10 AM 04/03/2023   10:54 AM  BP/Weight  Systolic BP 127 144 127  Diastolic BP 86 92 79  Wt. (Lbs)  305.6 310.6  BMI  39.24 kg/m2 39.88 kg/m2      Physical Exam Constitutional:      Appearance: He is well-developed.  Cardiovascular:     Rate and Rhythm: Normal rate.     Heart sounds: Normal heart sounds. No murmur heard. Pulmonary:     Effort: Pulmonary effort is normal.     Breath sounds: Normal breath sounds. No wheezing or rales.  Chest:     Chest wall: No  tenderness.  Abdominal:     General: Bowel sounds are normal. There is no distension.     Palpations: Abdomen is soft. There is no mass.     Tenderness: There is no abdominal tenderness.  Musculoskeletal:        General: Normal range of motion.     Right lower leg: No edema.     Left lower leg: No edema.     Comments: Slight tenderness on deep palpation of right thoracolumbar muscle Negative straight leg raise bilaterally  Neurological:     Mental Status: He is alert and oriented to person, place, and time.  Psychiatric:        Mood and Affect: Mood normal.    Diabetic Foot Exam - Simple   Simple Foot Form Diabetic Foot exam was performed with the following findings: Yes 11/03/2023 11:19 AM  Visual Inspection See comments: Yes Sensation Testing Intact to touch and monofilament testing bilaterally: Yes Pulse Check Posterior Tibialis and Dorsalis pulse intact bilaterally: Yes Comments Dystrophic great toenails.  Medial aspect of bilateral feet with callus formation.  No ulceration        Latest Ref Rng & Units 04/03/2023   11:35 AM 10/01/2022   10:33 AM 03/20/2022    2:22 PM  CMP  Glucose 70 - 99 mg/dL 98  894  92   BUN 8 - 27 mg/dL 12  16  21    Creatinine 0.76 - 1.27 mg/dL 8.94  8.94  8.91   Sodium 134 - 144 mmol/L 140  140  142   Potassium 3.5 - 5.2 mmol/L 4.8  4.1  4.3   Chloride 96 - 106 mmol/L 101  100  102   CO2 20 - 29 mmol/L 25  26  29    Calcium  8.6 -  10.2 mg/dL 9.2  9.1  9.5   Total Protein 6.0 - 8.5 g/dL 7.0  6.9  7.2   Total Bilirubin 0.0 - 1.2 mg/dL 0.6  0.5  0.4   Alkaline Phos 44 - 121 IU/L 78  74  66   AST 0 - 40 IU/L 28  27  27    ALT 0 - 44 IU/L 23  25  30      Lipid Panel     Component Value Date/Time   CHOL 151 10/01/2022 1033   TRIG 98 10/01/2022 1033   HDL 56 10/01/2022 1033   CHOLHDL 2.7 11/06/2020 0841   CHOLHDL 4.4 01/07/2014 0921   VLDL 21 01/07/2014 0921   LDLCALC 77 10/01/2022 1033    CBC    Component Value Date/Time   WBC 7.2 10/01/2022 1033   WBC 8.9 05/27/2016 1316   RBC 4.81 10/01/2022 1033   RBC 5.04 05/27/2016 1316   HGB 13.4 10/01/2022 1033   HCT 41.6 10/01/2022 1033   PLT 227 10/01/2022 1033   MCV 87 10/01/2022 1033   MCH 27.9 10/01/2022 1033   MCH 28.2 05/27/2016 1316   MCHC 32.2 10/01/2022 1033   MCHC 33.6 05/27/2016 1316   RDW 13.9 10/01/2022 1033   LYMPHSABS 1.6 10/01/2022 1033   EOSABS 0.1 10/01/2022 1033   BASOSABS 0.0 10/01/2022 1033    Lab Results  Component Value Date   HGBA1C 6.5 11/03/2023    [facility] - 1.5    Assessment & Plan Low back pain with right-sided sciatica Acute low back pain with right-sided sciatica, likely due to lifting at work. Tylenol more effective than tizanidine . No urinary issues or significant  neurological deficits. - Continue Tylenol for pain management. - Provide educational material on safe lifting techniques. - Encourage use of a back support belt at work.  Type 2 diabetes mellitus Type 2 diabetes mellitus with recent A1c of 6.5%, slightly increased from 6.4% in January. Current management includes Ozempic , with dosage adjustment needed due to recent move. - Start Ozempic  at 0.25 mg once weekly for four weeks, then increase to 0.5 mg. - Monitor blood glucose levels regularly. - Check A1c periodically.  Morbid obesity Morbid obesity managed with medication. Ozempic  is part of the weight management strategy.  Calluses  of feet Presence of calluses on feet, requiring podiatric care. - Refer to podiatrist for callus management.  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 - Continue to abstain from alcohol.     Healthcare maintenance Up-to-date on prostate cancer screening and colon cancer screening  Meds ordered this encounter  Medications   Semaglutide ,0.25 or 0.5MG /DOS, (OZEMPIC , 0.25 OR 0.5 MG/DOSE,) 2 MG/3ML SOPN    Sig: Inject 0.25 mg into the skin once a week. For 4 weeks then increase to 0.5mg     Dispense:  3 mL    Refill:  0    Previously tried Metformin     Follow-up: Return in about 6 months (around 05/05/2024) for Chronic medical conditions.       Corrina Sabin, MD, FAAFP. [facility] [place], [place] [phone]   11/03/2023, 1:03 PM

## 2023-11-03 NOTE — Patient Instructions (Addendum)
 0.25mg  which he needs to start with first then increase to 0.5mg  after 4 weeks.   Managing Chronic Back Pain Chronic back pain is pain that lasts longer than 3 months. It often affects the lower back. It may feel like a muscle ache or a sharp, stabbing pain. It can be mild, moderate, or severe. There are things you can do to help manage your pain. See what works best for you. Your health care provider may also give you other instructions. What actions can I take to manage my chronic back pain? You may be given a treatment plan by your provider. Treatment often starts with rest and pain relief. It may also include: Physical therapy. These are exercises to help restore movement and strength to your back. Techniques to help you relax. Counseling or therapy. Cognitive behavioral therapy (CBT) is a form of therapy that helps you set goals and make changes. Acupuncture or massage therapy. Local electrical stimulation. Injections. You may be given medicines to numb an area or relieve pain. If other treatments do not help, you may need surgery. How to use body mechanics and posture to help with pain You can help relieve stress on your back with good posture and healthy body mechanics. Body mechanics are all the ways your body moves during the day. Posture is part of body mechanics. Good posture means: Your spine is in its correct S-curve, or neutral, position. Your shoulders are pulled back a bit. Your head is not tipped forward. To improve your posture and body mechanics, follow these guidelines. Standing  When standing, keep your feet about hip-width apart. Keep your knees slightly bent. Your ears, shoulders, and hips should line up. Your spine should be neutral. When you stand in one place for a long time, place one foot on a stable object that is 2-4 inches (5-10 cm) high, such as a footstool. Sitting  When sitting, keep your feet flat on the floor. Use a footrest, if needed. Keep your thighs  parallel to the floor. Try not to round your shoulders or tilt your head forward. When working at a desk or a computer: Position your desk so your hands are a little lower than your elbows. Slide your chair under your desk so you are close enough to have good posture. Position your monitor so you are looking straight ahead and do not have to tilt your head to view the screen. Lifting  Keep your feet shoulder-width apart. Tighten the muscles of your abdomen. Bend your knees and hips. Keep your spine neutral. Lift using the strength of your legs, not your back. Do not lock your knees straight out. Ask for help to lift heavy or awkward objects. Resting  Do not lie down in a way that causes pain. If you have pain when you sit, bend, stoop, or squat, lie in a way that your body does not bend much. Try not to curl up on your side with your arms and knees near your chest (fetal position). If it hurts to stand for a long time or reach with your arms, lie with your spine neutral and knees bent slightly. Try lying: On your side with a pillow between your knees. On your back with a pillow under your knees. How to recognize changes in your chronic back pain Let your provider know if your pain gets worse or does not get better with treatment. Your back pain may be getting worse if you have pain that: Starts to cause problems with your  posture. Gets worse when you sit, stand, walk, bend, or lift things. Happens when you are active, at rest, or both. Makes it hard for you to move around (limits mobility). Occurs with fever, weight loss, or trouble peeing (urinating). Causes numbness and tingling. Follow these instructions at home: Medicines You may need to take medicines for pain and inflammation. These may be taken by mouth or put on the skin. You may also be given muscle relaxants. Take over-the-counter and prescription medicines only as told by your provider. Ask your provider if the medicine  prescribed to you: Requires you to avoid driving or using machinery. Can cause constipation. You may need to take these actions to prevent or treat constipation: Drink enough fluid to keep your pee (urine) pale yellow. Take over-the-counter or prescription medicines. Eat foods that are high in fiber, such as beans, whole grains, and fresh fruits and vegetables. Limit foods that are high in fat and processed sugars, such as fried or sweet foods. Lifestyle Do not use any products that contain nicotine or tobacco. These products include cigarettes, chewing tobacco, and vaping devices, such as e-cigarettes. If you need help quitting, ask your provider. Eat a healthy diet. Eat lots of vegetables, fruits, fish, and lean meats. Work with your provider to stay at a healthy weight. General instructions Get regular exercise as told. Exercise can help with flexibility and strength. If physical therapy was prescribed, do exercises as told by your provider. Use ice or heat therapy as told by your provider. Where can I get support? Think about joining a support group for people with chronic back pain. You can find some groups at: Pain Connection Program: painconnection.org The American Chronic Pain Association: acpanow.com Contact a health care provider if: Your pain does not get better with rest or medicine. You have new pain. You have a fever. You lose weight quickly. You have trouble doing your normal activities. You feel weak or numb in one or both of your legs or feet. Get help right away if: You are not able to control when you pee or poop. You have severe back pain and: Nausea or vomiting. Pain in your chest or abdomen. Shortness of breath. You faint. These symptoms may be an emergency. Get help right away. Call 911. Do not wait to see if the symptoms will go away. Do not drive yourself to the hospital. This information is not intended to replace advice given to you by your health care  provider. Make sure you discuss any questions you have with your health care provider. Document Revised: 10/29/2021 Document Reviewed: 10/29/2021 Elsevier Patient Education  2024 [employer].

## 2023-11-04 ENCOUNTER — Ambulatory Visit: Payer: Self-pay | Admitting: Family Medicine

## 2023-11-04 LAB — CMP14+EGFR
ALT: 15 IU/L (ref 0–44)
AST: 17 IU/L (ref 0–40)
Albumin: 3.7 g/dL — ABNORMAL LOW (ref 3.9–4.9)
Alkaline Phosphatase: 86 IU/L (ref 44–121)
BUN/Creatinine Ratio: 12 (ref 10–24)
BUN: 11 mg/dL (ref 8–27)
Bilirubin Total: 0.3 mg/dL (ref 0.0–1.2)
CO2: 25 mmol/L (ref 20–29)
Calcium: 9.1 mg/dL (ref 8.6–10.2)
Chloride: 99 mmol/L (ref 96–106)
Creatinine, Ser: 0.94 mg/dL (ref 0.76–1.27)
Globulin, Total: 3.1 g/dL (ref 1.5–4.5)
Glucose: 89 mg/dL (ref 70–99)
Potassium: 4.6 mmol/L (ref 3.5–5.2)
Sodium: 139 mmol/L (ref 134–144)
Total Protein: 6.8 g/dL (ref 6.0–8.5)
eGFR: 91 mL/min/1.73 (ref >59)

## 2023-11-04 LAB — LP+NON-HDL CHOLESTEROL
Cholesterol, Total: 126 mg/dL (ref 100–199)
HDL: 42 mg/dL (ref >39)
LDL Chol Calc (NIH): 73 mg/dL (ref 0–99)
Total Non-HDL-Chol (LDL+VLDL): 84 mg/dL (ref 0–129)
Triglycerides: 50 mg/dL (ref 0–149)
VLDL Cholesterol Cal: 11 mg/dL (ref 5–40)

## 2023-11-05 ENCOUNTER — Other Ambulatory Visit: Payer: Self-pay

## 2023-11-10 ENCOUNTER — Ambulatory Visit (INDEPENDENT_AMBULATORY_CARE_PROVIDER_SITE_OTHER): Admitting: Podiatry

## 2023-11-10 ENCOUNTER — Encounter: Payer: Self-pay | Admitting: Podiatry

## 2023-11-10 DIAGNOSIS — L84 Corns and callosities: Secondary | ICD-10-CM | POA: Diagnosis not present

## 2023-11-10 NOTE — Progress Notes (Signed)
 This patient presents to the office with painful callus on his big toes  both feet.  He says these areas become painful walking and wearing his shoes.  He was referrred to this office by his doctor for evaluation and treatment.  General Appearance  Alert, conversant and in no acute stress.  Vascular  Dorsalis pedis and posterior tibial  pulses are palpable  bilaterally.  Capillary return is within normal limits  bilaterally. Temperature is within normal limits  bilaterally.  Neurologic  Senn-Weinstein monofilament wire test within normal limits  bilaterally. Muscle power within normal limits bilaterally.  Nails Thick disfigured discolored nails with subungual debris  from hallux to fifth toes bilaterally. No evidence of bacterial infection or drainage bilaterally.  Orthopedic  No limitations of motion  feet .  No crepitus or effusions noted. Bone spur noted on medial aspect distal phalanx at the level of IPJ.  Skin  normotropic skin with no porokeratosis noted bilaterally.  No signs of infections or ulcers noted.   Pinch callus hallux  B/l.  Pimch callus  B/L  IE.  Debride callus with dremel tool.  Padding was dispensed.  RTC 3 months    Cordella Bold DPM

## 2023-11-27 ENCOUNTER — Encounter: Payer: Self-pay | Admitting: Family Medicine

## 2023-11-28 ENCOUNTER — Other Ambulatory Visit: Payer: Self-pay

## 2024-02-11 ENCOUNTER — Ambulatory Visit: Admitting: Podiatry

## 2024-04-06 ENCOUNTER — Ambulatory Visit: Payer: Self-pay

## 2024-04-06 NOTE — Telephone Encounter (Signed)
 FYI Only or Action Required?: Action required by provider: requesting appointment Monday  .  Patient was last seen in primary care on 11/03/2023 by Delbert Clam, MD.  [facility] Nurse Triage reporting Leg Swelling, Foot Swelling, and Ankle Pain.  Symptoms began a week ago.  Interventions attempted: Nothing.  Symptoms are: stable.  Triage Disposition: See HCP Within 4 Hours (Or PCP Triage)  Patient/caregiver understands and will follow disposition?: No, refuses disposition          Reason for Disposition  [1] Red area or streak [2] large (> 2 inches or 5 cm)  Answer Assessment - Initial Assessment Questions Patient reports pain by ankles whenever touches them. Mostly the mostly the right side  ankle and foot  for pain but left side is painful and red too . Right side is swollen as well. Started 1 week ago. Not had this before. Spouse thinks circulation problem. Area is reddish on both ankles  peeling a bit itchy. Requesting appointment primary care office only on a  Monday day off not able to come in another day when offered and does not think can go to UC today is at work. No able to find Monday opening at primary care office , patient requesting call back to schedule. Patient does report will seek ER if chest  pain or shortness of breath     1. ONSET: When did the swelling start? (e.g., minutes, hours, days)     Started 1 week ago  2. LOCATION: What part of the leg is swollen?  Are both legs swollen or just one leg?     Swelling right foot and ankle  3. SEVERITY: How bad is the swelling? (e.g., localized; mild, moderate, severe)     Noticeable  compared to left  4. REDNESS: Is there redness or signs of infection?     Redness of ankle and foot right side, and redness on left side but no swelling  5. PAIN: Is the swelling painful to touch? If Yes, ask: How painful is it?   (Scale 1-10; mild, moderate or severe)     Yes tender to touch mild  6. FEVER: Do you have  a fever? If Yes, ask: What is it, how was it measured, and when did it start?      Denies  7. CAUSE: What do you think is causing the leg swelling?     Unknown   8. MEDICAL HISTORY: Do you have a history of blood clots (e.g., DVT), cancer, heart failure, kidney disease, or liver failure?     Denies  9. RECURRENT SYMPTOM: Have you had leg swelling before? If Yes, ask: When was the last time? What happened that time?     No has not  10. OTHER SYMPTOMS: Do you have any other symptoms? (e.g., chest pain, difficulty breathing)  Patient denies the following chest pain, shortness of breath , fever  Protocols used: Leg Swelling and Edema-A-AH  Copied from CRM #[id number]. Topic: Clinical - Red Word Triage >> Apr 06, 2024  4:13 PM Avram MATSU wrote: Red Word that prompted transfer to Nurse Triage: swelling in legs and foot with pain   ----------------------------------------------------------------------- From previous Reason for Contact - Scheduling: Patient/patient representative is calling to schedule an appointment. Refer to attachments for appointment information.

## 2024-04-06 NOTE — Telephone Encounter (Signed)
 Please reach out to patient and schedule him with any provider.

## 2024-04-07 ENCOUNTER — Telehealth: Payer: Self-pay | Admitting: Family Medicine

## 2024-04-07 NOTE — Telephone Encounter (Signed)
 Contacted pt left vm to confirmed appt (per vr)

## 2024-04-08 ENCOUNTER — Other Ambulatory Visit: Payer: Self-pay

## 2024-04-08 ENCOUNTER — Ambulatory Visit: Attending: Family Medicine | Admitting: Family Medicine

## 2024-04-08 ENCOUNTER — Encounter: Payer: Self-pay | Admitting: Family Medicine

## 2024-04-08 DIAGNOSIS — Z7985 Long-term (current) use of injectable non-insulin antidiabetic drugs: Secondary | ICD-10-CM

## 2024-04-08 DIAGNOSIS — E1169 Type 2 diabetes mellitus with other specified complication: Secondary | ICD-10-CM | POA: Diagnosis not present

## 2024-04-08 DIAGNOSIS — E1159 Type 2 diabetes mellitus with other circulatory complications: Secondary | ICD-10-CM

## 2024-04-08 DIAGNOSIS — Z23 Encounter for immunization: Secondary | ICD-10-CM

## 2024-04-08 DIAGNOSIS — F1010 Alcohol abuse, uncomplicated: Secondary | ICD-10-CM

## 2024-04-08 DIAGNOSIS — I872 Venous insufficiency (chronic) (peripheral): Secondary | ICD-10-CM | POA: Diagnosis not present

## 2024-04-08 DIAGNOSIS — Z6839 Body mass index (BMI) 39.0-39.9, adult: Secondary | ICD-10-CM | POA: Diagnosis not present

## 2024-04-08 DIAGNOSIS — I10 Essential (primary) hypertension: Secondary | ICD-10-CM

## 2024-04-08 LAB — POCT GLYCOSYLATED HEMOGLOBIN (HGB A1C): HbA1c, POC (controlled diabetic range): 6.2 % (ref 0.0–7.0)

## 2024-04-08 MED ORDER — LOSARTAN POTASSIUM 100 MG PO TABS
100.0000 mg | ORAL_TABLET | Freq: Every day | ORAL | 1 refills | Status: AC
  Filled 2024-04-08: qty 90, 90d supply, fill #0

## 2024-04-08 MED ORDER — AMLODIPINE BESYLATE 10 MG PO TABS
10.0000 mg | ORAL_TABLET | Freq: Every day | ORAL | 1 refills | Status: AC
  Filled 2024-04-08: qty 90, 90d supply, fill #0

## 2024-04-08 MED ORDER — OZEMPIC (0.25 OR 0.5 MG/DOSE) 2 MG/3ML ~~LOC~~ SOPN
0.5000 mg | PEN_INJECTOR | SUBCUTANEOUS | 6 refills | Status: AC
  Filled 2024-04-08: qty 3, 28d supply, fill #0

## 2024-04-08 MED ORDER — HYDROCHLOROTHIAZIDE 25 MG PO TABS
12.5000 mg | ORAL_TABLET | Freq: Every day | ORAL | 1 refills | Status: AC
  Filled 2024-04-08: qty 45, 90d supply, fill #0

## 2024-04-08 MED ORDER — FUROSEMIDE 20 MG PO TABS
20.0000 mg | ORAL_TABLET | Freq: Every day | ORAL | 0 refills | Status: AC
  Filled 2024-04-08: qty 30, 30d supply, fill #0

## 2024-04-08 MED ORDER — OZEMPIC (0.25 OR 0.5 MG/DOSE) 2 MG/3ML ~~LOC~~ SOPN
0.2500 mg | PEN_INJECTOR | SUBCUTANEOUS | 0 refills | Status: AC
  Filled 2024-04-08: qty 3, 42d supply, fill #0

## 2024-04-08 MED ORDER — ATORVASTATIN CALCIUM 20 MG PO TABS
20.0000 mg | ORAL_TABLET | Freq: Every day | ORAL | 1 refills | Status: AC
  Filled 2024-04-08: qty 90, 90d supply, fill #0

## 2024-04-08 NOTE — Progress Notes (Unsigned)
 "  Subjective:  Patient ID: Seth Weaver, male    DOB: [birth year]  Age: 64 y.o. MRN: [id number]  CC: Medical Management of Chronic Issues (Swelling in right leg and ankle for 1 week/Discuss PCV-20 vaccine)     Discussed the use of AI scribe software for clinical note transcription with the patient, who gave verbal consent to proceed.  History of Present Illness Seth Weaver is a 64 year old male with hypertension and diabetes who presents with swelling in the right leg and ankle.  He has had painless swelling of the right leg and ankle for one week, from mid-calf to the foot, with occasional skin peeling. He denies trauma or shortness of breath.  He has hypertension treated with amlodipine , hydrochlorothiazide , and losartan , taken separately. He does not check blood pressure at home.  He has diabetes with recent A1c 6.2, improved from 6.5. He stopped Ozempic  0.5 mg due to personal circumstances and difficulty with injections.  He reports increased alcohol use and worsening mood after recent deaths of his father and nephew. He avoids adding salt to meals but eats salty snacks.  He would like the pneumonia vaccine today.    Past Medical History:  Diagnosis Date   Diabetes mellitus without complication (HCC)    Essential hypertension 02/22/2014   Hyperlipidemia    Hypertension    Inguinal hernia 10/07/2014    Past Surgical History:  Procedure Laterality Date   HERNIA REPAIR      Family History  Problem Relation Age of Onset   Diabetes Father    Hypertension Father    Colon cancer Paternal Uncle    Colon polyps Neg Hx    Esophageal cancer Neg Hx    Rectal cancer Neg Hx    Stomach cancer Neg Hx     Social History   Socioeconomic History   Marital status: Married    Spouse name: Not on file   Number of children: Not on file   Years of education: Not on file   Highest education level: Not on file  Occupational History   Not on file  Tobacco Use   Smoking status:  Never   Smokeless tobacco: Never  Vaping Use   Vaping status: Never Used  Substance and Sexual Activity   Alcohol use: No    Comment: Pt states he used to drink but quit approximately 2 months ago.    Drug use: No   Sexual activity: Not on file  Other Topics Concern   Not on file  Social History Narrative   Not on file   Social Drivers of Health   Tobacco Use: Low Risk (04/08/2024)   Patient History    Smoking Tobacco Use: Never    Smokeless Tobacco Use: Never    Passive Exposure: Not on file  Financial Resource Strain: Medium Risk (04/03/2023)   Overall Financial Resource Strain (CARDIA)    Difficulty of Paying Living Expenses: Somewhat hard  Food Insecurity: No Food Insecurity (06/04/2023)   Hunger Vital Sign    Worried About Running Out of Food in the Last Year: Never true    Ran Out of Food in the Last Year: Never true  Recent Concern: Food Insecurity - Food Insecurity Present (04/18/2023)   Hunger Vital Sign    Worried About Running Out of Food in the Last Year: Sometimes true    Ran Out of Food in the Last Year: Sometimes true  Transportation Needs: No Transportation Needs (06/04/2023)   PRAPARE - Transportation  Lack of Transportation (Medical): No    Lack of Transportation (Non-Medical): No  Physical Activity: Patient Declined (04/03/2023)   Exercise Vital Sign    Days of Exercise per Week: Patient declined    Minutes of Exercise per Session: Patient declined  Stress: No Stress Concern Present (04/03/2023)   [employer] of Occupational [employer] - Occupational Stress Questionnaire    Feeling of Stress : Not at all  Social Connections: Socially Integrated (04/03/2023)   Social Connection and Isolation Panel    Frequency of Communication with Friends and Family: Once a week    Frequency of Social Gatherings with Friends and Family: Twice a week    Attends Religious Services: More than 4 times per year    Active Member of Clubs or Organizations: Yes    Attends Tax Inspector Meetings: More than 4 times per year    Marital Status: Married  Depression (PHQ2-9): Low Risk (11/03/2023)   Depression (PHQ2-9)    PHQ-2 Score: 0  Alcohol Screen: Low Risk (04/03/2023)   Alcohol Screen    Last Alcohol Screening Score (AUDIT): 7  Housing: Unknown (06/04/2023)   Housing Stability Vital Sign    Unable to Pay for Housing in the Last Year: No    Number of Times Moved in the Last Year: Not on file    Homeless in the Last Year: No  Utilities: Not At Risk (06/04/2023)   [employer] Utilities    Threatened with loss of utilities: No  Health Literacy: Adequate Health Literacy (04/03/2023)   B1300 Health Literacy    Frequency of need for help with medical instructions: Rarely    Allergies[1]  Outpatient Medications Prior to Visit  Medication Sig Dispense Refill   amLODipine  (NORVASC ) 10 MG tablet Take 1 tablet (10 mg total) by mouth daily. 90 tablet 1   atorvastatin  (LIPITOR) 20 MG tablet Take 1 tablet (20 mg total) by mouth daily. 90 tablet 1   Blood Glucose Monitoring Suppl (BLOOD GLUCOSE METER KIT AND SUPPLIES) Dispense based on patient and insurance preference. Test blood sugar once daily as directed. (FOR ICD-9 250.00, 250.01). 1 each 0   hydrochlorothiazide  (HYDRODIURIL ) 25 MG tablet Take 12.5 mg by mouth daily.     losartan  (COZAAR ) 100 MG tablet Take 100 mg by mouth daily. for high blood pressure     tiZANidine  (ZANAFLEX ) 4 MG tablet Take 1 tablet (4 mg total) by mouth every 8 (eight) hours as needed. 60 tablet 0   Semaglutide ,0.25 or 0.5MG /DOS, (OZEMPIC , 0.25 OR 0.5 MG/DOSE,) 2 MG/3ML SOPN Inject 0.5 mg into the skin once a week. (Patient not taking: Reported on 04/08/2024) 3 mL 6   Semaglutide ,0.25 or 0.5MG /DOS, (OZEMPIC , 0.25 OR 0.5 MG/DOSE,) 2 MG/3ML SOPN Inject 0.25 mg into the skin once a week. For 4 weeks then increase to 0.5mg  (Patient not taking: Reported on 04/08/2024) 3 mL 0   losartan -hydrochlorothiazide  (HYZAAR ) 100-25 MG tablet Take 1 tablet by mouth daily.  90 tablet 1   No facility-administered medications prior to visit.     ROS Review of Systems  Constitutional:  Negative for activity change and appetite change.  HENT:  Negative for sinus pressure and sore throat.   Respiratory:  Negative for chest tightness, shortness of breath and wheezing.   Cardiovascular:  Positive for leg swelling. Negative for chest pain and palpitations.  Gastrointestinal:  Negative for abdominal distention, abdominal pain and constipation.  Genitourinary: Negative.   Musculoskeletal: Negative.   Psychiatric/Behavioral:  Negative for behavioral problems and dysphoric  mood.     Objective:  BP (!) 140/81   Pulse 99   Temp 98.2 F (36.8 C) (Oral)   Ht 6' 2 (1.88 m)   Wt (!) 306 lb (138.8 kg)   SpO2 98%   BMI 39.29 kg/m      04/08/2024    9:25 AM 11/03/2023   11:30 AM 11/03/2023   11:10 AM  BP/Weight  Systolic BP 140 127 144  Diastolic BP 81 86 92  Wt. (Lbs) 306  305.6  BMI 39.29 kg/m2  39.24 kg/m2      Physical Exam Constitutional:      Appearance: He is well-developed.  Cardiovascular:     Rate and Rhythm: Normal rate.     Heart sounds: Normal heart sounds. No murmur heard. Pulmonary:     Effort: Pulmonary effort is normal.     Breath sounds: Normal breath sounds. No wheezing or rales.  Chest:     Chest wall: No tenderness.  Abdominal:     General: Bowel sounds are normal. There is no distension.     Palpations: Abdomen is soft. There is no mass.     Tenderness: There is no abdominal tenderness.  Musculoskeletal:        General: Normal range of motion.     Right lower leg: Edema (2+ extending to mid leg) present.     Left lower leg: Edema (1+ ankle) present.     Comments: Negative Homan's sign bilaterally  Skin:    Comments: Scabs on lateral aspect of both legs  Neurological:     Mental Status: He is alert and oriented to person, place, and time.  Psychiatric:        Mood and Affect: Mood normal.    ***    Latest Ref Rng &  Units 11/03/2023   11:51 AM 04/03/2023   11:35 AM 10/01/2022   10:33 AM  CMP  Glucose 70 - 99 mg/dL 89  98  894   BUN 8 - 27 mg/dL 11  12  16    Creatinine 0.76 - 1.27 mg/dL 9.05  8.94  8.94   Sodium 134 - 144 mmol/L 139  140  140   Potassium 3.5 - 5.2 mmol/L 4.6  4.8  4.1   Chloride 96 - 106 mmol/L 99  101  100   CO2 20 - 29 mmol/L 25  25  26    Calcium  8.6 - 10.2 mg/dL 9.1  9.2  9.1   Total Protein 6.0 - 8.5 g/dL 6.8  7.0  6.9   Total Bilirubin 0.0 - 1.2 mg/dL 0.3  0.6  0.5   Alkaline Phos 44 - 121 IU/L 86  78  74   AST 0 - 40 IU/L 17  28  27    ALT 0 - 44 IU/L 15  23  25      Lipid Panel     Component Value Date/Time   CHOL 126 11/03/2023 1151   TRIG 50 11/03/2023 1151   HDL 42 11/03/2023 1151   CHOLHDL 2.7 11/06/2020 0841   CHOLHDL 4.4 01/07/2014 0921   VLDL 21 01/07/2014 0921   LDLCALC 73 11/03/2023 1151    CBC    Component Value Date/Time   WBC 7.2 10/01/2022 1033   WBC 8.9 05/27/2016 1316   RBC 4.81 10/01/2022 1033   RBC 5.04 05/27/2016 1316   HGB 13.4 10/01/2022 1033   HCT 41.6 10/01/2022 1033   PLT 227 10/01/2022 1033   MCV 87 10/01/2022 1033  MCH 27.9 10/01/2022 1033   MCH 28.2 05/27/2016 1316   MCHC 32.2 10/01/2022 1033   MCHC 33.6 05/27/2016 1316   RDW 13.9 10/01/2022 1033   LYMPHSABS 1.6 10/01/2022 1033   EOSABS 0.1 10/01/2022 1033   BASOSABS 0.0 10/01/2022 1033    Lab Results  Component Value Date   HGBA1C 6.2 04/08/2024   Lab Results  Component Value Date   HGBA1C 6.2 04/08/2024   HGBA1C 6.5 11/03/2023   HGBA1C 6.4 04/03/2023       1. 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   Follow-up: No follow-ups on file.       Corrina Sabin, MD, FAAFP. [facility] [place], [place] [phone]   04/08/2024, 9:31 AM    [1] No Known  Allergies  "

## 2024-04-08 NOTE — Patient Instructions (Signed)
 VISIT SUMMARY:  During your visit, we discussed the swelling in your right leg and ankle, your diabetes management, blood pressure control, and recent increase in alcohol use. We also talked about your general health maintenance, including the pneumonia vaccine.  YOUR PLAN:  -BILATERAL LOWER EXTREMITY EDEMA DUE TO CHRONIC VENOUS INSUFFICIENCY: This condition means that the veins in your legs are not working properly, causing swelling. We have ordered an ultrasound to rule out a blood clot, referred you to a vascular specialist, recommended wearing compression stockings, advised you to elevate your legs when possible, and prescribed an additional diuretic to help reduce the swelling.  -TYPE 2 DIABETES MELLITUS ASSOCIATED WITH MORBID OBESITY: Your diabetes is well-controlled with an A1c of 6.2. We discussed switching to an oral medication due to difficulty with injections, but for now, we have restarted Ozempic  at 0.25 mg with the help of a pharmacist. We will consider switching to an oral medication if Ozempic  is not tolerated.  -PRIMARY HYPERTENSION: Your blood pressure is currently well-controlled with your current medications: amlodipine , hydrochlorothiazide , and losartan . We discussed combining losartan  and hydrochlorothiazide , but you prefer to continue with your current regimen, so we will do that.  -MORBID OBESITY: We discussed this in the context of managing your diabetes and potential weight loss strategies. We will consider an oral medication for weight loss if Ozempic  is not tolerated.  -ALCOHOL USE, IN REMISSION: You have recently experienced a relapse in alcohol use due to personal stressors. We discussed the potential impact on your health and recommended counseling for alcohol use and stress management.  -GENERAL HEALTH MAINTENANCE: We discussed and administered the pneumonia vaccine, which is recommended and well-tolerated.  INSTRUCTIONS:  Please follow up with the vascular  specialist as referred. Continue wearing compression stockings and elevating your legs when possible. Take the additional diuretic as prescribed. Restart Ozempic  at 0.25 mg and consult with the pharmacist for assistance. Consider counseling for alcohol use and stress management. Continue with your current blood pressure medications. Schedule a follow-up appointment to review your progress and any new concerns.

## 2024-04-09 ENCOUNTER — Encounter: Payer: Self-pay | Admitting: Family Medicine

## 2024-04-09 ENCOUNTER — Other Ambulatory Visit: Payer: Self-pay

## 2024-04-09 LAB — MICROALBUMIN / CREATININE URINE RATIO
Creatinine, Urine: 155.1 mg/dL
Microalb/Creat Ratio: 3 mg/g{creat} (ref 0–29)
Microalbumin, Urine: 4.7 ug/mL

## 2024-04-09 LAB — BASIC METABOLIC PANEL WITH GFR
BUN/Creatinine Ratio: 15 (ref 10–24)
BUN: 13 mg/dL (ref 8–27)
CO2: 23 mmol/L (ref 20–29)
Calcium: 9 mg/dL (ref 8.6–10.2)
Chloride: 102 mmol/L (ref 96–106)
Creatinine, Ser: 0.88 mg/dL (ref 0.76–1.27)
Glucose: 93 mg/dL (ref 70–99)
Potassium: 4.9 mmol/L (ref 3.5–5.2)
Sodium: 141 mmol/L (ref 134–144)
eGFR: 97 mL/min/1.73

## 2024-04-12 ENCOUNTER — Ambulatory Visit: Payer: Self-pay | Admitting: Family Medicine

## 2024-04-12 ENCOUNTER — Ambulatory Visit (HOSPITAL_COMMUNITY)
Admission: RE | Admit: 2024-04-12 | Discharge: 2024-04-12 | Disposition: A | Discharge status: Home / Self Care | Source: Ambulatory Visit | Attending: Family Medicine | Admitting: Family Medicine

## 2024-04-12 DIAGNOSIS — I872 Venous insufficiency (chronic) (peripheral): Secondary | ICD-10-CM | POA: Insufficient documentation

## 2024-10-06 ENCOUNTER — Ambulatory Visit: Payer: Self-pay | Admitting: Family Medicine
